# Patient Record
Sex: Female | Born: 1985 | Race: Black or African American | Hispanic: No | Smoking: Never smoker
Health system: Southern US, Community
[De-identification: ages and names within clinical notes are randomized; demographics above are authoritative.]

## PROBLEM LIST (undated history)

## (undated) DIAGNOSIS — T7840XA Allergy, unspecified, initial encounter: Secondary | ICD-10-CM

## (undated) DIAGNOSIS — Z789 Other specified health status: Secondary | ICD-10-CM

## (undated) DIAGNOSIS — F419 Anxiety disorder, unspecified: Secondary | ICD-10-CM

## (undated) DIAGNOSIS — M5430 Sciatica, unspecified side: Secondary | ICD-10-CM

## (undated) DIAGNOSIS — F32A Depression, unspecified: Secondary | ICD-10-CM

## (undated) HISTORY — DX: Depression, unspecified: F32.A

## (undated) HISTORY — PX: FOOT SURGERY: SHX648

## (undated) HISTORY — DX: Anxiety disorder, unspecified: F41.9

## (undated) HISTORY — DX: Allergy, unspecified, initial encounter: T78.40XA

---

## 2004-06-04 ENCOUNTER — Inpatient Hospital Stay (HOSPITAL_COMMUNITY): Admission: AD | Admit: 2004-06-04 | Discharge: 2004-06-04 | Payer: Self-pay | Admitting: Obstetrics and Gynecology

## 2004-06-13 ENCOUNTER — Other Ambulatory Visit: Admission: RE | Admit: 2004-06-13 | Discharge: 2004-06-13 | Payer: Self-pay | Admitting: Obstetrics and Gynecology

## 2004-12-31 ENCOUNTER — Inpatient Hospital Stay (HOSPITAL_COMMUNITY): Admission: AD | Admit: 2004-12-31 | Discharge: 2005-01-02 | Payer: Self-pay | Admitting: Obstetrics and Gynecology

## 2004-12-31 ENCOUNTER — Inpatient Hospital Stay (HOSPITAL_COMMUNITY): Admission: AD | Admit: 2004-12-31 | Discharge: 2004-12-31 | Payer: Self-pay | Admitting: *Deleted

## 2005-10-04 ENCOUNTER — Other Ambulatory Visit: Admission: RE | Admit: 2005-10-04 | Discharge: 2005-10-04 | Payer: Self-pay | Admitting: Obstetrics and Gynecology

## 2005-11-14 ENCOUNTER — Inpatient Hospital Stay (HOSPITAL_COMMUNITY): Admission: AD | Admit: 2005-11-14 | Discharge: 2005-11-16 | Payer: Self-pay | Admitting: Obstetrics and Gynecology

## 2005-12-26 ENCOUNTER — Other Ambulatory Visit: Admission: RE | Admit: 2005-12-26 | Discharge: 2005-12-26 | Payer: Self-pay | Admitting: Obstetrics and Gynecology

## 2008-06-23 ENCOUNTER — Emergency Department (HOSPITAL_COMMUNITY): Admission: EM | Admit: 2008-06-23 | Discharge: 2008-06-23 | Payer: Self-pay | Admitting: Emergency Medicine

## 2009-12-12 ENCOUNTER — Emergency Department (HOSPITAL_COMMUNITY): Admission: EM | Admit: 2009-12-12 | Discharge: 2009-12-12 | Payer: Self-pay | Admitting: Emergency Medicine

## 2010-04-18 ENCOUNTER — Emergency Department (HOSPITAL_COMMUNITY)
Admission: EM | Admit: 2010-04-18 | Discharge: 2010-04-18 | Payer: Self-pay | Source: Home / Self Care | Admitting: Emergency Medicine

## 2010-04-24 LAB — BASIC METABOLIC PANEL
BUN: 12 mg/dL (ref 6–23)
CO2: 24 mEq/L (ref 19–32)
Calcium: 9.8 mg/dL (ref 8.4–10.5)
Chloride: 104 mEq/L (ref 96–112)
Creatinine, Ser: 0.92 mg/dL (ref 0.4–1.2)
GFR calc Af Amer: >60 mL/min (ref >60)
GFR calc non Af Amer: >60 mL/min (ref >60)
Glucose, Bld: 107 mg/dL — ABNORMAL HIGH (ref 70–99)
Potassium: 3.9 mEq/L (ref 3.5–5.1)
Sodium: 138 mEq/L (ref 135–145)

## 2010-08-25 NOTE — Discharge Summary (Signed)
[name], [name]              ACCOUNT NO.:  [id number]   MEDICAL RECORD NO.:  [id number]          PATIENT TYPE:  INP   LOCATION:  9126                          FACILITY:  [facility]   PHYSICIAN:  Malachi Pro. Ambrose Mantle, M.D. DATE OF BIRTH:  Oct 12, 1985   DATE OF ADMISSION:  12/31/2004  DATE OF DISCHARGE:  01/02/2005                                 DISCHARGE SUMMARY   HISTORY OF PRESENT ILLNESS:  A 25 year old, black, single female, para 0,  gravida 1, [facility] January 03, 2005 by ultrasound admitted in active labor.  Blood group and type A positive, negative antibiotic, sickle cell negative.  RPR nonreactive.  Rubella immune. Hepatitis B surface antigen negative.  HIV  negative.  GC and Chlamydia negative.  Triple screen normal.  One-hour  Glucola 100.  Group B strep negative.  Vaginal ultrasound on June 04, 2004:  Crown-rump length 2.6 cm, 9 weeks 4 days, [facility] January 03, 2005.  Pap smear showed ASCUS, positive HPV.  CIN-1 by colposcopy.  Prenatal care  was uncomplicated.  Cervix was a tight fingertip, 60% on December 29, 2004.  The patient came to the maternity admission on the day of admission at  approximately 10:30 a.m. with contractions.  After three hours of  observation, regular contractions continued but there was no cervical  change.  After the patient went home, her contractions intensified and she  returned and the cervix was 3-4 cm when she returned.  She was kept in MAU  from 5:10 p.m. until approximately 7:15 p.m. when a bed became available.   ALLERGIES:  PENICILLIN caused a rash.   PAST MEDICAL HISTORY:  No known illnesses.   PAST SURGICAL HISTORY:  In 2001 and 2002, she had arches placed in her feet.  Alcohol, tobacco and drugs:  None.   FAMILY HISTORY:  Brother died at 2 of a glioma.  Mother has sickle cell  trait.  There is no other close first-degree relative history.   PHYSICAL EXAMINATION:  VITAL SIGNS: Normal.  HEART AND LUNGS: Normal.  ABDOMEN:  Soft.   Fundal height had been 37 cm on December 29, 2004.  Fetal  heart tones were normal except for on deceleration when the patient was  supine.  PELVIC:  When I examined the patient at 7:45 p.m., the cervix was 7 cm,  100%, vertex at -3 to -4 with a bulging bag of waters.  Artificial rupture  of membranes produced clear fluid.   The patient received an epidural and progressed to full dilatation.  She  pushed well and delivered spontaneously OA over a second-degree, right of  the midline laceration, a living female infant, 7 pounds 0 ounces, Apgars of 9  at one and 9 at five minutes.  Dr. Ambrose Mantle was in attendance.  Placenta was  intact.  Uterus normal.  Rectal negative.  Second-degree laceration repaired  with 3-0 Vicryl.  Blood loss about 400 mL.  At 12:10 a.m. on January 01, 2005, the patient's temperature was 100.7 degrees.  She never became  February after that.  She continued to do well and on the morning  of the  second postpartum day was considered stable and ready for discharge.  Initial hemoglobin 13.4, hematocrit 40, white count 16,100, platelet count  276,000.  Followup hemoglobin 11, hematocrit 33.1, white count 21,500.   FINAL DIAGNOSIS:  Intrauterine pregnancy at 39 weeks, delivered OA.   OPERATION/PROCEDURE:  1.  Spontaneous delivery OA.  2.  Repair of right second-degree laceration.   CONDITION ON DISCHARGE:  Improved.   DISCHARGE INSTRUCTIONS:  1.  Regular diet.  2.  Our regular discharge instruction booklet.  3.  The patient declines analgesics at discharge.  4.  Advised to return to the office in six weeks for followup examination.      Malachi Pro. Ambrose Mantle, M.D.  Electronically Signed     TFH/MEDQ  D:  01/02/2005  T:  01/02/2005  Job:  [id number]

## 2010-08-25 NOTE — Discharge Summary (Signed)
[name], [name] NO.:  [id number]   MEDICAL RECORD NO.:  [id number]          PATIENT TYPE:  INP   LOCATION:  9133                          [facility]ILITY:  [facility]   PHYSICIAN:  Huel Cote, M.D. DATE OF BIRTH:  05/24/1985   DATE OF ADMISSION:  11/14/2005  DATE OF DISCHARGE:  11/16/2005                                 DISCHARGE SUMMARY   DISCHARGE DIAGNOSES:  1. Term pregnancy at 38 weeks, delivered.  2. Status post normal spontaneous vaginal delivery.   DISCHARGE MEDICATIONS:  1. Motrin 600 mg p.o. every 6 hours.  2. Percocet 1-2 tablets p.o. every 4 hours p.r.n.   DISCHARGE FOLLOW-UP:  The patient is to follow up in 6 weeks for her routine  postpartum exam.  She is also requesting a tubal and when she signs her  tubal papers, this will be performed in postpartum outpatient fashion.   HISTORY OF PRESENT ILLNESS:  The patient is a 24 year old G2 P1-0-0-1, who  was admitted to 38+ plus weeks to deliver with spontaneous rupture of  membranes and contractions every 3-4 minutes.  Initially the patient was  thought to be 9 cm by RN and was quickly taken to labor and delivery, where  she was found to actually be 5 cm.  Prenatal care was complicated by late  start at 24 weeks and a positive chlamydia, which was treated with test of  cure negative.  The patient also had a low-grade Pap smear with a colposcopy  performed in pregnancy, which was acceptable and a repeat Pap smear planned  postpartum.  The patient's group B strep status at the time of admission  could not be determined and was initially reported negative; however, it was  later found that this was actually from her previous pregnancy and that her  status was indeed positive.  However, this was not discovered until after  the patient delivered.   Prenatal labs are as follows:  A positive, antibody negative, sickle  negative, RPR nonreactive, rubella immune, hepatitis B surface antigen  negative, GC  negative, chlamydia positive chest cure negative, group B strep  ultimately found to be positive.   PAST OBSTETRICAL HISTORY:  In September 2006 the patient had a vaginal  delivery of a 39-week infant at 7 pounds.   PAST GYN HISTORY:  Positive chlamydia, which was treated this pregnancy, and  a low-grade SIL Pap smear.   PAST MEDICAL HISTORY:  None.   PAST SURGICAL HISTORY:  Foot surgery x2.   ALLERGIES:  PENICILLIN.   MEDICATIONS:  None.   HOSPITAL COURSE:  On admission, the patient was afebrile with stable vital  signs.  Fetal heart rate was reactive.  She had occasional variable  decelerations and was contracting approximately every 4 minutes.  After  admission she was found to be 95, 6 and a 0 station by my exam.  She did  receive an epidural.  Shortly after her epidural she quickly progressed to  complete dilation and pushed well and had a normal spontaneous vaginal  delivery of a vigorous female infant over an intact perineum.  Apgars were  9  and 9, weight was 6 pounds 6 ounces.  Placenta delivered spontaneously.  There was a nuchal cord x1, which was reduced after delivery of the head.  Shallow abrasions only were noted that did not need repair.  Estimated blood  loss was 350 mL.  The patient was requesting a tubal ligation, however had  never addressed this issue of my office and had never signed tubal papers.  Therefore, it was explained her that this would have to be delayed until  outpatient and we would discuss it further.  She was then admitted for  routine postpartum care after delivery and did quite well.  Her hemoglobin  was 11.1 and by postpartum #2 she was ambulating, having no difficulty with  voiding and her pain was well-controlled with Motrin and Percocet.      Huel Cote, M.D.  Electronically Signed     KR/MEDQ  D:  11/16/2005  T:  11/16/2005  Job:  [id number]

## 2010-09-22 ENCOUNTER — Emergency Department (HOSPITAL_COMMUNITY)
Admission: EM | Admit: 2010-09-22 | Discharge: 2010-09-22 | Disposition: A | Discharge status: Home / Self Care | Payer: PRIVATE HEALTH INSURANCE | Attending: Emergency Medicine | Admitting: Emergency Medicine

## 2010-09-22 DIAGNOSIS — S335XXA Sprain of ligaments of lumbar spine, initial encounter: Secondary | ICD-10-CM | POA: Insufficient documentation

## 2010-09-22 DIAGNOSIS — X58XXXA Exposure to other specified factors, initial encounter: Secondary | ICD-10-CM | POA: Insufficient documentation

## 2010-09-22 LAB — URINALYSIS, ROUTINE W REFLEX MICROSCOPIC
Bilirubin Urine: NEGATIVE
Glucose, UA: NEGATIVE mg/dL
Hgb urine dipstick: NEGATIVE
Ketones, ur: NEGATIVE mg/dL
Leukocytes, UA: NEGATIVE
Nitrite: NEGATIVE
Protein, ur: NEGATIVE mg/dL
Specific Gravity, Urine: 1.025 (ref 1.005–1.030)
Urobilinogen, UA: 0.2 mg/dL (ref 0.0–1.0)
pH: 6 (ref 5.0–8.0)

## 2010-09-22 LAB — POCT PREGNANCY, URINE: Preg Test, Ur: NEGATIVE

## 2011-03-19 ENCOUNTER — Encounter: Payer: Self-pay | Admitting: *Deleted

## 2011-03-19 DIAGNOSIS — A499 Bacterial infection, unspecified: Secondary | ICD-10-CM | POA: Insufficient documentation

## 2011-03-19 DIAGNOSIS — B9689 Other specified bacterial agents as the cause of diseases classified elsewhere: Secondary | ICD-10-CM | POA: Insufficient documentation

## 2011-03-19 DIAGNOSIS — R109 Unspecified abdominal pain: Secondary | ICD-10-CM | POA: Insufficient documentation

## 2011-03-19 DIAGNOSIS — N760 Acute vaginitis: Secondary | ICD-10-CM | POA: Insufficient documentation

## 2011-03-19 DIAGNOSIS — R10819 Abdominal tenderness, unspecified site: Secondary | ICD-10-CM | POA: Insufficient documentation

## 2011-03-19 NOTE — ED Notes (Signed)
abd pain,. No nvd

## 2011-03-20 ENCOUNTER — Emergency Department (HOSPITAL_COMMUNITY)
Admission: EM | Admit: 2011-03-20 | Discharge: 2011-03-20 | Disposition: A | Discharge status: Home / Self Care | Payer: No Typology Code available for payment source | Attending: Emergency Medicine | Admitting: Emergency Medicine

## 2011-03-20 DIAGNOSIS — B9689 Other specified bacterial agents as the cause of diseases classified elsewhere: Secondary | ICD-10-CM

## 2011-03-20 LAB — URINE MICROSCOPIC-ADD ON

## 2011-03-20 LAB — URINALYSIS, ROUTINE W REFLEX MICROSCOPIC
Bilirubin Urine: NEGATIVE
Glucose, UA: NEGATIVE mg/dL
Ketones, ur: NEGATIVE mg/dL
Leukocytes, UA: NEGATIVE
Nitrite: NEGATIVE
Protein, ur: NEGATIVE mg/dL
Specific Gravity, Urine: 1.02 (ref 1.005–1.030)
Urobilinogen, UA: 0.2 mg/dL (ref 0.0–1.0)
pH: 6.5 (ref 5.0–8.0)

## 2011-03-20 LAB — WET PREP, GENITAL
Trich, Wet Prep: NONE SEEN
Yeast Wet Prep HPF POC: NONE SEEN

## 2011-03-20 LAB — PREGNANCY, URINE: Preg Test, Ur: NEGATIVE

## 2011-03-20 MED ORDER — HYDROCODONE-ACETAMINOPHEN 5-325 MG PO TABS
2.0000 | ORAL_TABLET | Freq: Once | ORAL | Status: AC
  Administered 2011-03-20: 2 via ORAL
  Filled 2011-03-20: qty 2

## 2011-03-20 MED ORDER — HYDROCODONE-ACETAMINOPHEN 5-500 MG PO TABS: 1.0000 | ORAL_TABLET | Freq: Four times a day (QID) | ORAL | Status: AC | PRN

## 2011-03-20 MED ORDER — METRONIDAZOLE 500 MG PO TABS: 500.0000 mg | ORAL_TABLET | Freq: Two times a day (BID) | ORAL | Status: AC

## 2011-03-20 NOTE — ED Notes (Signed)
Pt reports lower abdominal pain x 1day, denies any nvd, or fever. Denies any urinary symptoms.

## 2011-03-20 NOTE — ED Provider Notes (Signed)
History     CSN: [id number] Arrival date & time: 03/20/2011 12:15 AM   First MD Initiated Contact with Patient 03/20/11 0047      Chief Complaint  Patient presents with  . Abdominal Pain    (Consider location/radiation/quality/duration/timing/severity/associated sxs/prior treatment) Patient is a 25 y.o. female presenting with abdominal pain. The history is provided by the patient. No language interpreter was used.  Abdominal Pain The primary symptoms of the illness include abdominal pain. The primary symptoms of the illness do not include fatigue, shortness of breath, nausea, vomiting, diarrhea, dysuria, vaginal discharge or vaginal bleeding. The current episode started 13 to 24 hours ago. The onset of the illness was gradual. The problem has been gradually worsening.  The abdominal pain began 13 to24 hours ago. The pain came on gradually. The abdominal pain has been gradually worsening since its onset. The abdominal pain is located in the suprapubic region. The abdominal pain does not radiate. The abdominal pain is relieved by nothing. The abdominal pain is exacerbated by movement.  The patient states that she believes she is currently not pregnant. The patient has not had a change in bowel habit. Symptoms associated with the illness do not include anorexia, constipation, urgency, frequency or back pain.    History reviewed. No pertinent past medical history.  History reviewed. No pertinent past surgical history.  History reviewed. No pertinent family history.  History  Substance Use Topics  . Smoking status: Never Smoker   . Smokeless tobacco: Not on file  . Alcohol Use: No    OB History    Grav Para Term Preterm Abortions TAB SAB Ect Mult Living                  Review of Systems  Constitutional: Negative for activity change, appetite change and fatigue.  HENT: Negative for congestion, sore throat, rhinorrhea, neck pain and neck stiffness.   Respiratory: Negative for  cough and shortness of breath.   Cardiovascular: Negative for chest pain and palpitations.  Gastrointestinal: Positive for abdominal pain. Negative for nausea, vomiting, diarrhea, constipation and anorexia.  Genitourinary: Negative for dysuria, urgency, frequency, flank pain, vaginal bleeding and vaginal discharge.  Musculoskeletal: Negative for back pain and arthralgias.  Neurological: Negative for dizziness, weakness, light-headedness, numbness and headaches.  All other systems reviewed and are negative.    Allergies  Bee venom; Peanut-containing drug products; and Penicillins  Home Medications   Current Outpatient Rx  Name Route Sig Dispense Refill  . LEVONORGESTREL 20 MCG/24HR IU IUD Intrauterine 1 each by Intrauterine route once.      Marygrace Drought WOMENS PO Oral Take 1 tablet by mouth daily.      Marland Kitchen HYDROCODONE-ACETAMINOPHEN 5-500 MG PO TABS Oral Take 1-2 tablets by mouth every 6 (six) hours as needed for pain. 10 tablet 0  . METRONIDAZOLE 500 MG PO TABS Oral Take 1 tablet (500 mg total) by mouth 2 (two) times daily. 14 tablet 0    BP 126/70  Pulse 81  Temp(Src) 98.3 F (36.8 C) (Oral)  Resp 20  Ht 5\' 4"  (1.626 m)  Wt 146 lb (66.225 kg)  BMI 25.06 kg/m2  SpO2 100%  LMP 03/10/2011  Physical Exam  Nursing note and vitals reviewed. Constitutional: She is oriented to person, place, and time. She appears well-developed and well-nourished. No distress.  HENT:  Head: Normocephalic and atraumatic.  Mouth/Throat: Oropharynx is clear and moist.  Eyes: Conjunctivae and EOM are normal. Pupils are equal, round, and reactive to light.  Neck: Normal range of motion. Neck supple.  Cardiovascular: Normal rate, regular rhythm, normal heart sounds and intact distal pulses.  Exam reveals no gallop and no friction rub.   No murmur heard. Pulmonary/Chest: Effort normal and breath sounds normal. No respiratory distress.  Abdominal: Soft. Bowel sounds are normal. There is tenderness (mild  suprapubic abd pain). There is no rebound and no guarding.  Genitourinary: Vagina normal and uterus normal. Cervix exhibits no motion tenderness, no discharge and no friability. Right adnexum displays no tenderness. Left adnexum displays no tenderness. No vaginal discharge found.  Musculoskeletal: Normal range of motion. She exhibits no tenderness.  Neurological: She is alert and oriented to person, place, and time. No cranial nerve deficit.  Skin: Skin is warm and dry. No rash noted.    ED Course  Procedures (including critical care time)  Labs Reviewed  URINALYSIS, ROUTINE W REFLEX MICROSCOPIC - Abnormal; Notable for the following:    Hgb urine dipstick TRACE (*)    All other components within normal limits  WET PREP, GENITAL - Abnormal; Notable for the following:    Clue Cells, Wet Prep MODERATE (*)    WBC, Wet Prep HPF POC FEW (*)    All other components within normal limits  URINE MICROSCOPIC-ADD ON - Abnormal; Notable for the following:    Squamous Epithelial / LPF FEW (*)    All other components within normal limits  PREGNANCY, URINE  GC/CHLAMYDIA PROBE AMP, GENITAL   No results found.   1. Bacterial vaginosis   2. Abdominal pain       MDM  Wet prep with evidence of bacterial vaginosis. She'll be prescribed a course of Flagyl. There is no indication for imaging at this time as her pain is minimal on palpation. I have no concern about a surgical etiology. I have no concern for ovarian torsion she has no adnexal tenderness on pelvic examination. She has no cervical motion tenderness therefore once the treatment for PID. She is instructed to followup with her OB/GYN as her pain may be secondary to her Mirena.        Dayton Bailiff, MD 03/20/11 [phone]

## 2011-06-07 ENCOUNTER — Encounter (HOSPITAL_COMMUNITY): Payer: Self-pay | Admitting: Emergency Medicine

## 2011-06-07 ENCOUNTER — Emergency Department (HOSPITAL_COMMUNITY)
Admission: EM | Admit: 2011-06-07 | Discharge: 2011-06-07 | Disposition: A | Discharge status: Home / Self Care | Payer: No Typology Code available for payment source | Attending: Emergency Medicine | Admitting: Emergency Medicine

## 2011-06-07 DIAGNOSIS — I10 Essential (primary) hypertension: Secondary | ICD-10-CM | POA: Insufficient documentation

## 2011-06-07 DIAGNOSIS — T7840XA Allergy, unspecified, initial encounter: Secondary | ICD-10-CM | POA: Insufficient documentation

## 2011-06-07 MED ORDER — PREDNISONE 20 MG PO TABS
60.0000 mg | ORAL_TABLET | Freq: Once | ORAL | Status: AC
  Administered 2011-06-07: 60 mg via ORAL
  Filled 2011-06-07: qty 3

## 2011-06-07 MED ORDER — DIPHENHYDRAMINE HCL 25 MG PO CAPS: 25.0000 mg | ORAL_CAPSULE | Freq: Four times a day (QID) | ORAL | Status: DC | PRN

## 2011-06-07 MED ORDER — FAMOTIDINE 20 MG PO TABS: 20.0000 mg | ORAL_TABLET | Freq: Two times a day (BID) | ORAL | Status: DC

## 2011-06-07 MED ORDER — FAMOTIDINE 20 MG PO TABS
20.0000 mg | ORAL_TABLET | Freq: Once | ORAL | Status: AC
  Administered 2011-06-07: 20 mg via ORAL
  Filled 2011-06-07: qty 1

## 2011-06-07 MED ORDER — DIPHENHYDRAMINE HCL 25 MG PO CAPS
50.0000 mg | ORAL_CAPSULE | Freq: Once | ORAL | Status: AC
  Administered 2011-06-07: 50 mg via ORAL
  Filled 2011-06-07: qty 2

## 2011-06-07 NOTE — Discharge Instructions (Signed)

## 2011-06-07 NOTE — ED Notes (Signed)
Patient states she was at work and drank a frappe at 2330.  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  Patient states about half hour ago began having a rash and itching around her ears and eyes.

## 2011-06-07 NOTE — ED Provider Notes (Signed)
History     CSN: [id number]  Arrival date & time 06/07/11  2952   First MD Initiated Contact with Patient 06/07/11 [phone]      Chief Complaint  Patient presents with  . Allergic Reaction    (Consider location/radiation/quality/duration/timing/severity/associated sxs/prior treatment) The history is provided by the patient.   patient reports developing mild swelling around her bilateral eyes approximately 4 hours ago.  She has no shortness of breath.  She denies difficulty breathing or swallowing.  She reports that she does have a history of allergies tomatoes and thinks that she may have gotten some anger in whatever drinks.  She reports that her eyes were itching as well.  She has no visual changes.  She has no rash or hives.  She has not tried any medications at home for her symptoms.  She's not tried Benadryl.  Nothing worsens her symptoms.  Nothing improves her symptoms.  Her symptoms are constant and mild in severity to  History reviewed. No pertinent past medical history.  History reviewed. No pertinent past surgical history.  No family history on file.  History  Substance Use Topics  . Smoking status: Never Smoker   . Smokeless tobacco: Not on file  . Alcohol Use: No    OB History    Grav Para Term Preterm Abortions TAB SAB Ect Mult Living                  Review of Systems  All other systems reviewed and are negative.    Allergies  Bee venom; Mango flavor; Peanut-containing drug products; and Penicillins  Home Medications   Current Outpatient Rx  Name Route Sig Dispense Refill  . ONE-A-DAY WOMENS PO Oral Take 1 tablet by mouth daily.      Marland Kitchen DIPHENHYDRAMINE HCL 25 MG PO CAPS Oral Take 1 capsule (25 mg total) by mouth every 6 (six) hours as needed for itching. 15 capsule 0  . FAMOTIDINE 20 MG PO TABS Oral Take 1 tablet (20 mg total) by mouth 2 (two) times daily. 5 tablet 0  . LEVONORGESTREL 20 MCG/24HR IU IUD Intrauterine 1 each by Intrauterine route once.          BP 127/66  Pulse 76  Temp(Src) 98 F (36.7 C) (Oral)  Resp 18  Ht 5\' 4"  (1.626 m)  Wt 140 lb (63.504 kg)  BMI 24.03 kg/m2  SpO2 100%  LMP 06/06/2011  Physical Exam  Nursing note and vitals reviewed. Constitutional: She is oriented to person, place, and time. She appears well-developed and well-nourished. No distress.  HENT:  Head: Normocephalic and atraumatic.       No lip protime swelling.  Posterior pharynx is patent.  Mild swelling of the upper eyelids bilaterally.  Eyes: EOM are normal.  Neck: Normal range of motion.  Cardiovascular: Normal rate, regular rhythm and normal heart sounds.   Pulmonary/Chest: Effort normal and breath sounds normal.  Abdominal: Soft. She exhibits no distension. There is no tenderness.  Musculoskeletal: Normal range of motion.  Neurological: She is alert and oriented to person, place, and time.  Skin: Skin is warm and dry.  Psychiatric: She has a normal mood and affect. Judgment normal.    ED Course  Procedures (including critical care time)  Labs Reviewed - No data to display No results found.   1. Allergic reaction       MDM  The patient is well-appearing.  This is likely mild allergic reaction.  Benadryl and Pepcid given in the  ER.  A single dose of prednisone be given.  Discharged home on antihistamines and Pepcid.  She understands to return to the ER for new or worsening symptoms.  Vital signs are stable.        Lyanne Co, MD 06/07/11 [phone]

## 2011-12-13 ENCOUNTER — Other Ambulatory Visit: Payer: Self-pay

## 2012-01-07 ENCOUNTER — Other Ambulatory Visit: Payer: Self-pay

## 2012-01-14 ENCOUNTER — Other Ambulatory Visit (HOSPITAL_COMMUNITY): Payer: Self-pay | Admitting: Obstetrics and Gynecology

## 2012-01-14 DIAGNOSIS — IMO0002 Reserved for concepts with insufficient information to code with codable children: Secondary | ICD-10-CM

## 2012-01-18 ENCOUNTER — Encounter (HOSPITAL_COMMUNITY): Payer: Self-pay

## 2012-01-18 ENCOUNTER — Ambulatory Visit (HOSPITAL_COMMUNITY)
Admission: RE | Admit: 2012-01-18 | Discharge: 2012-01-18 | Disposition: A | Discharge status: Home / Self Care | Payer: Medicaid Other | Source: Ambulatory Visit | Attending: Obstetrics and Gynecology | Admitting: Obstetrics and Gynecology

## 2012-01-18 VITALS — BP 121/66 | HR 115 | Wt 158.0 lb

## 2012-01-18 DIAGNOSIS — IMO0002 Reserved for concepts with insufficient information to code with codable children: Secondary | ICD-10-CM

## 2012-01-18 DIAGNOSIS — Z1389 Encounter for screening for other disorder: Secondary | ICD-10-CM | POA: Insufficient documentation

## 2012-01-18 DIAGNOSIS — O358XX0 Maternal care for other (suspected) fetal abnormality and damage, not applicable or unspecified: Secondary | ICD-10-CM | POA: Insufficient documentation

## 2012-01-18 DIAGNOSIS — Z363 Encounter for antenatal screening for malformations: Secondary | ICD-10-CM | POA: Insufficient documentation

## 2012-01-18 NOTE — ED Notes (Signed)
Patient states occasional cramping, no bleeding noted.

## 2012-01-18 NOTE — Progress Notes (Signed)
GYANNA JAREMA  was seen today for an ultrasound appointment.  See full report in [facility].  Alpha Gula, MD  Ms. Belitz is seen today due to dilated right fetal kidney and echogenic focus.  Quad screen was low risk for aneuploidy.  She had cell free fetal DNA drawn at the referring clinic earlier this week - results currently pending.   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  There is minimal residual renal tissue noted.  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  The left kidney appears normal and the amniotic fluid volume is also normal.  An echogenic intracardiac focus is also noted in the left ventricle.  The findings and limitations of the study were discussed with the patient.  She will likely require Peds urology evaluation after delivery - will make arrangements for consult with them in the 3rd trimester.  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

## 2012-02-15 ENCOUNTER — Encounter (HOSPITAL_COMMUNITY): Payer: Self-pay

## 2012-02-15 ENCOUNTER — Ambulatory Visit (HOSPITAL_COMMUNITY)
Admission: RE | Admit: 2012-02-15 | Discharge: 2012-02-15 | Disposition: A | Discharge status: Home / Self Care | Payer: Medicaid Other | Source: Ambulatory Visit | Attending: Obstetrics and Gynecology | Admitting: Obstetrics and Gynecology

## 2012-02-15 VITALS — BP 107/62 | HR 86 | Wt 164.5 lb

## 2012-02-15 DIAGNOSIS — Z3689 Encounter for other specified antenatal screening: Secondary | ICD-10-CM | POA: Insufficient documentation

## 2012-02-15 DIAGNOSIS — IMO0002 Reserved for concepts with insufficient information to code with codable children: Secondary | ICD-10-CM

## 2012-02-15 DIAGNOSIS — O358XX0 Maternal care for other (suspected) fetal abnormality and damage, not applicable or unspecified: Secondary | ICD-10-CM | POA: Insufficient documentation

## 2012-02-15 NOTE — Progress Notes (Signed)
[name]  was seen today for an ultrasound appointment.  See full report in [facility].  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

## 2012-02-19 ENCOUNTER — Telehealth (HOSPITAL_COMMUNITY): Payer: Self-pay | Admitting: MS"

## 2012-02-19 NOTE — Telephone Encounter (Signed)
Called Ms. [name] regarding prenatal consultation with pediatric urologist with [facility]. Pediatric urologist is available for prenatal consults on Tuesday, 11/26 at 3:00 or January 28, given that he does not have a date planned in December. Patient planned to call her mother to see which date would work better and call our office back to schedule. We will plan to mail patient directions to [facility].    Clydie Braun Nikalas Bramel 02/19/2012 1:27 PM

## 2012-03-14 ENCOUNTER — Ambulatory Visit (HOSPITAL_COMMUNITY)
Admission: RE | Admit: 2012-03-14 | Discharge: 2012-03-14 | Disposition: A | Discharge status: Home / Self Care | Payer: Medicaid Other | Source: Ambulatory Visit | Attending: Obstetrics and Gynecology | Admitting: Obstetrics and Gynecology

## 2012-03-14 VITALS — BP 105/60 | HR 93 | Wt 172.5 lb

## 2012-03-14 DIAGNOSIS — O358XX0 Maternal care for other (suspected) fetal abnormality and damage, not applicable or unspecified: Secondary | ICD-10-CM | POA: Insufficient documentation

## 2012-03-14 DIAGNOSIS — IMO0002 Reserved for concepts with insufficient information to code with codable children: Secondary | ICD-10-CM

## 2012-03-14 NOTE — Progress Notes (Signed)
[name] was seen for ultrasound appointment today.  Please see [facility] report for details.

## 2012-03-30 LAB — OB RESULTS CONSOLE ABO/RH: RH Type: POSITIVE

## 2012-03-30 LAB — OB RESULTS CONSOLE HIV ANTIBODY (ROUTINE TESTING): HIV: NONREACTIVE

## 2012-03-30 LAB — OB RESULTS CONSOLE RUBELLA ANTIBODY, IGM: Rubella: IMMUNE

## 2012-04-11 ENCOUNTER — Ambulatory Visit (HOSPITAL_COMMUNITY)
Admission: RE | Admit: 2012-04-11 | Discharge: 2012-04-11 | Disposition: A | Discharge status: Home / Self Care | Payer: Medicaid Other | Source: Ambulatory Visit | Attending: Obstetrics and Gynecology | Admitting: Obstetrics and Gynecology

## 2012-04-11 VITALS — BP 108/57 | HR 106 | Wt 177.5 lb

## 2012-04-11 DIAGNOSIS — O358XX0 Maternal care for other (suspected) fetal abnormality and damage, not applicable or unspecified: Secondary | ICD-10-CM | POA: Insufficient documentation

## 2012-04-11 DIAGNOSIS — IMO0002 Reserved for concepts with insufficient information to code with codable children: Secondary | ICD-10-CM

## 2012-04-11 NOTE — Progress Notes (Signed)
PARTICIA STRAHM  was seen today for an ultrasound appointment.  See full report in [facility].  Comments: Uterocele with Grade IV right sided and now with Grade III hydronephrosis on the left (this finding was not noted at the time of evaluation by [facility]).    There is little residual renal tissue remaining on the right side.  The estimated fetal weight again today is > 90th %tile, but suspect that this is due to an enlarged AC (> 97th %tile) from the massive hydronephrosis.  The amniotic fluid volume is normal.  Ultrasound findings were discussed with the patient. Will contact Peds Urology to ensure that they would proceed with the follow up plan as previously outlined - with delivery at [facility], renal ultrasound of the newborn, antibiotic prophylaxis and evaluation by [facility] Urology after birth.  Will reevaluate AC at next growth scan (3 weeks) - would consider cesarean delivery to avoid abdominal dystocia / traumatic vaginal delivery based on the size of the abdomen.  Impression: Single living intrauterine pregnancy at 33 weeks 6 days. 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 Little remaining right renal tissue appreciated. Normal amniotic fluiv volume AC > 97th %tile - due to massive right sided hydronephrosis  Recommendations: Recommend weekly BPP with amniotic fluid assessments. Follow up growth scan in 3 weeks - will make recommendations for route of delivery at that time Will contact Peds urology - with normal amniotic fluid volume, doubt that this will alter delivery plan.  Alpha Gula, MD

## 2012-04-16 ENCOUNTER — Telehealth (HOSPITAL_COMMUNITY): Payer: Self-pay | Admitting: MS"

## 2012-04-16 NOTE — Telephone Encounter (Signed)
From: Baltazar Najjar  To: Marcos Eke. Jaicey Sweaney   Monday, April 14, 2012 9:20 AM  The ureterocele is likely occluding the bladder neck and causing contralateral hydro. As long as amniotic fluid levels are ok I think delivery as scheduled is fine but I would make sure the child is on amox prophylaxis and sees [place] with an [place] within a week or so birth     From: Clydie Braun L. Hayly Litsey  Sent: Friday, April 11, 2012 4:41 PM To: Baltazar Najjar Cc: Clarene Critchley. [place]; Mady Gemma Subject: Additional prenatal pt with new finding of bilateral hydronephrosis Hi Dr. Yetta Flock, We saw a patient in the [facility] office today for follow-up ultrasound, Len Klu[facility] MRN: [id number]). She had a prenatal consult with you on 11/26 due to right severe hydronephrosis and ureterocele in a female fetus. Today on follow-up ultrasound, grade III hydronephrosis of the left kidney was visualized, which is a new finding in the pregnancy. Left kidney was normal on previous ultrasounds. The amniotic fluid is normal on today's ultrasound. She is 33 weeks 6 days gestation today. (EDC: 05/24/12). She is planning to deliver at [facility] in [place]. Given the involvement now of the left kidney as well, would you change anything regarding the follow-up plan after delivery, including her location of delivery? Would you feel that it would be beneficial to see her again prenatally?  If it would be helpful to see any of the ultrasound images, we can ask for [facility] to release images to the [facility].   Thank you for very much your help regarding this patient.  Sincerely,  Clydie Braun

## 2012-04-17 ENCOUNTER — Ambulatory Visit (HOSPITAL_COMMUNITY)
Admission: RE | Admit: 2012-04-17 | Discharge: 2012-04-17 | Disposition: A | Discharge status: Home / Self Care | Payer: Medicaid Other | Source: Ambulatory Visit | Attending: Obstetrics and Gynecology | Admitting: Obstetrics and Gynecology

## 2012-04-17 VITALS — BP 107/64 | HR 102 | Wt 181.0 lb

## 2012-04-17 DIAGNOSIS — O358XX0 Maternal care for other (suspected) fetal abnormality and damage, not applicable or unspecified: Secondary | ICD-10-CM | POA: Insufficient documentation

## 2012-04-17 DIAGNOSIS — Z3689 Encounter for other specified antenatal screening: Secondary | ICD-10-CM | POA: Insufficient documentation

## 2012-04-17 DIAGNOSIS — IMO0002 Reserved for concepts with insufficient information to code with codable children: Secondary | ICD-10-CM

## 2012-04-17 NOTE — Progress Notes (Signed)
[facility] ultrasound  Indication: 27 yr old G3P2002 at [redacted]w[redacted]d with fetus with bilateral hydronephrosis and ureterocele for biophysical profile.  Findings: 1. Single intrauterine pregnancy. 2. Anterior placenta without evidence of previa. 3. Normal amniotic fluid index. 4. Again seen is bilateral hydronephrosis; right side>left side. There is also hydroureter on the right side. 5. The ureterocele is not seen on today's exam. 6. The bladder and stomach appear normal. 7. Normal biophysical profile of 8/8.  Recommendations: 1. Genitourinary anomalies: - previously counseled - has met with Pediatric urology - continue weekly BPP and evaluation of amniotic fluid - fetal growth in 2 weeks to aid in delivery recommendations - had negative MaterniT21 screen 2. Recommend follow up in 1 week  Eulis Foster, MD

## 2012-04-24 ENCOUNTER — Ambulatory Visit (HOSPITAL_COMMUNITY)
Admission: RE | Admit: 2012-04-24 | Discharge: 2012-04-24 | Disposition: A | Discharge status: Home / Self Care | Payer: Medicaid Other | Source: Ambulatory Visit | Attending: Family Medicine | Admitting: Family Medicine

## 2012-04-24 VITALS — BP 112/63 | HR 93 | Wt 181.0 lb

## 2012-04-24 DIAGNOSIS — IMO0002 Reserved for concepts with insufficient information to code with codable children: Secondary | ICD-10-CM

## 2012-04-24 DIAGNOSIS — O358XX0 Maternal care for other (suspected) fetal abnormality and damage, not applicable or unspecified: Secondary | ICD-10-CM | POA: Insufficient documentation

## 2012-04-24 DIAGNOSIS — Z3689 Encounter for other specified antenatal screening: Secondary | ICD-10-CM | POA: Insufficient documentation

## 2012-04-24 NOTE — Progress Notes (Signed)
[name]  was seen today for an ultrasound appointment.  See full report in [facility].  Impression Single living intrauterine pregnancy at 35 weeks 5 days. 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 Little remaining right renal tissue appreciated. Normal amniotic fluiv volume The fetus is active with a BPP of 8/8  Case reviewed with Dr. Yetta Flock (Peds Urology) - plan delivery at [facility] with neonatal ultrasound and Peds Urology follow up after delivery  Recommendations: Recommend follow up ultrasound for growth and BPP next week - will determine route of delivery based on AC size at that time.  Alpha Gula, MD

## 2012-04-25 ENCOUNTER — Ambulatory Visit (HOSPITAL_COMMUNITY): Payer: Medicaid Other

## 2012-05-01 ENCOUNTER — Ambulatory Visit (HOSPITAL_COMMUNITY)
Admission: RE | Admit: 2012-05-01 | Discharge: 2012-05-01 | Disposition: A | Discharge status: Home / Self Care | Payer: Medicaid Other | Source: Ambulatory Visit | Attending: Family Medicine | Admitting: Family Medicine

## 2012-05-01 ENCOUNTER — Encounter (HOSPITAL_COMMUNITY): Payer: Self-pay

## 2012-05-01 VITALS — BP 114/62 | HR 117 | Wt 183.0 lb

## 2012-05-01 DIAGNOSIS — IMO0002 Reserved for concepts with insufficient information to code with codable children: Secondary | ICD-10-CM

## 2012-05-01 DIAGNOSIS — O358XX0 Maternal care for other (suspected) fetal abnormality and damage, not applicable or unspecified: Secondary | ICD-10-CM | POA: Insufficient documentation

## 2012-05-01 NOTE — Progress Notes (Signed)
[name]  was seen today for an ultrasound appointment.  See full report in [facility].  Impression: Single living intrauterine pregnancy at 36 weeks 5 days. Grade IV right sided and grade III left sided hydronephrosis. Little remaining right renal tissue appreciated on the right. Suspected ureterocele- although not well-visualized on today's study. The [facility] today is >> 97th %tile - most likely due to the enlarged cystic right kidney The EFW is > 90th %tile, but most likely secondary to the enlarged [facility] Normal amniotic fluiv volume The fetus is active with a BPP of 8/8  Case reviewed with Dr. Yetta Flock (Peds Urology) - plan delivery at [facility] with neonatal ultrasound and Peds Urology follow up after delivery  Recommendations: Based on enlarged [facility], recommend Cesarean delivery at [redacted] weeks gestation to avoid abdominal dystocia/ undue abdominal trauma with vaginal delivery. Recommend continued weekly BPPs and amniotic fluid assessments until delivery.  Alpha Gula, MD

## 2012-05-07 ENCOUNTER — Encounter (HOSPITAL_COMMUNITY): Payer: Self-pay | Admitting: Pharmacist

## 2012-05-08 ENCOUNTER — Encounter (HOSPITAL_COMMUNITY): Payer: Self-pay

## 2012-05-08 ENCOUNTER — Ambulatory Visit (HOSPITAL_COMMUNITY)
Admission: RE | Admit: 2012-05-08 | Discharge: 2012-05-08 | Disposition: A | Discharge status: Home / Self Care | Payer: Medicaid Other | Source: Ambulatory Visit | Attending: Obstetrics and Gynecology | Admitting: Obstetrics and Gynecology

## 2012-05-08 DIAGNOSIS — O358XX0 Maternal care for other (suspected) fetal abnormality and damage, not applicable or unspecified: Secondary | ICD-10-CM | POA: Insufficient documentation

## 2012-05-08 DIAGNOSIS — IMO0002 Reserved for concepts with insufficient information to code with codable children: Secondary | ICD-10-CM

## 2012-05-08 NOTE — Progress Notes (Signed)
[name]  was seen today for an ultrasound appointment.  See full report in [facility].  Impression: Single living intrauterine pregnancy at 37 weeks 5 days. Grade IV right sided and grade III left sided hydronephrosis. Little remaining right renal tissue appreciated on the right. Suspected ureterocele Normal amniotic fluiv volume The fetus is active with a BPP of 8/8  Plan delivery at [facility] with neonatal ultrasound and Peds Urology follow up after delivery  Recommendations: Plan scheduled C-section at [redacted] weeks gestation to avoid abdominal dystocia/ undue abdominal trauma secondary to enlarged [facility] Recommend continued weekly BPPs and amniotic fluid assessments until delivery.  Alpha Gula, MD

## 2012-05-15 ENCOUNTER — Encounter (HOSPITAL_COMMUNITY)
Admission: RE | Admit: 2012-05-15 | Discharge: 2012-05-15 | Disposition: A | Discharge status: Home / Self Care | Payer: Medicaid Other | Source: Ambulatory Visit | Attending: Obstetrics and Gynecology | Admitting: Obstetrics and Gynecology

## 2012-05-15 ENCOUNTER — Encounter (HOSPITAL_COMMUNITY): Payer: Self-pay

## 2012-05-15 ENCOUNTER — Ambulatory Visit (HOSPITAL_COMMUNITY)
Admission: RE | Admit: 2012-05-15 | Discharge: 2012-05-15 | Disposition: A | Discharge status: Home / Self Care | Payer: Medicaid Other | Source: Ambulatory Visit | Attending: Obstetrics and Gynecology | Admitting: Obstetrics and Gynecology

## 2012-05-15 DIAGNOSIS — IMO0002 Reserved for concepts with insufficient information to code with codable children: Secondary | ICD-10-CM

## 2012-05-15 DIAGNOSIS — O358XX0 Maternal care for other (suspected) fetal abnormality and damage, not applicable or unspecified: Secondary | ICD-10-CM | POA: Insufficient documentation

## 2012-05-15 HISTORY — DX: Other specified health status: Z78.9

## 2012-05-15 LAB — ABO/RH: ABO/RH(D): A POS

## 2012-05-15 LAB — TYPE AND SCREEN: ABO/RH(D): A POS

## 2012-05-15 LAB — CBC
HCT: 33.7 % — ABNORMAL LOW (ref 36.0–46.0)
Hemoglobin: 11.3 g/dL — ABNORMAL LOW (ref 12.0–15.0)
MCH: 29.8 pg (ref 26.0–34.0)
MCHC: 33.5 g/dL (ref 30.0–36.0)
MCV: 88.9 fL (ref 78.0–100.0)
RDW: 14.6 % (ref 11.5–15.5)

## 2012-05-15 NOTE — Patient Instructions (Addendum)
19 [name]  05/15/2012   Your procedure is scheduled on:  05/19/12  Enter through the Main Entrance of [facility] at 730 AM.  Pick up the phone at the desk and dial [phone].   Call this number if you have problems the morning of surgery: [phone]   Remember:   Do not eat food:After Midnight.  Do not drink clear liquids: After Midnight.  Take these medicines the morning of surgery with A SIP OF WATER: NA   Do not wear jewelry, make-up or nail polish.  Do not wear lotions, powders, or perfumes. You may wear deodorant.  Do not shave 48 hours prior to surgery.  Do not bring valuables to the hospital.  Contacts, dentures or bridgework may not be worn into surgery.  Leave suitcase in the car. After surgery it may be brought to your room.  For patients admitted to the hospital, checkout time is 11:00 AM the day of discharge.   Patients discharged the day of surgery will not be allowed to drive home.  Name and phone number of your driver: NA  Special Instructions: Shower using CHG 2 nights before surgery and the night before surgery.  If you shower the day of surgery use CHG.  Use special wash - you have one bottle of CHG for all showers.  You should use approximately 1/3 of the bottle for each shower.   Please read over the following fact sheets that you were given: Surgical Site Infection Prevention

## 2012-05-18 NOTE — H&P (Signed)
[name] is a 27 y.o. female, G3 P2002, EGA 39+ weeks with EDC 2-15 presenting for elective cesarean section at recommendation of MFM due to increased fetal abdominal circumference due to fetal hydronephrosis.  Normal cell free fetal DNA testing.  Prenatal care otherwise uncomplicated.  Maternal Medical History:  Fetal activity: Perceived fetal activity is normal.      OB History   Grav Para Term Preterm Abortions TAB SAB Ect Mult Living   3 2 2  0 0 0 0 0 0 2    SVD at term x 2 without complications  Past Medical History  Diagnosis Date  . No pertinent past medical history    Past Surgical History  Procedure Laterality Date  . Foot surgery  as child    arch corrections   Family History: family history is not on file. Social History:  reports that she has never smoked. She does not have any smokeless tobacco history on file. She reports that she does not drink alcohol or use illicit drugs.   Prenatal Transfer Tool  Maternal Diabetes: No Genetic Screening: Normal Maternal Ultrasounds/Referrals: Normal Fetal Ultrasounds or other Referrals:  Referred to Materal Fetal Medicine  Maternal Substance Abuse:  No Significant Maternal Medications:  None Significant Maternal Lab Results:  Lab values include: Group B Strep positive Other Comments:  Grade IV right fetal hydronephrosis, some dilation of left kidney as well.  Has had pediatric urology consult.  Review of Systems  Respiratory: Negative.   Cardiovascular: Negative.       Last menstrual period 08/18/2011. Maternal Exam:  Abdomen: Estimated fetal weight is 7 lbs.   Fetal presentation: vertex  Introitus: Normal vulva. Normal vagina.    Physical Exam  Constitutional: She appears well-developed and well-nourished.  Neck: Neck supple. No thyromegaly present.  Cardiovascular: Normal rate and regular rhythm.   Murmur (III/VI systolic murmur) heard. Respiratory: Effort normal and breath sounds normal. No respiratory  distress. She has no wheezes.  GI: Soft.  gravid   VE- 1/40/-3, vtx  Prenatal labs: ABO, Rh: --/--/A POS, A POS (02/06 1350) Antibody: NEG (02/06 1350) Rubella: Immune (12/22 0938) RPR: NON REACTIVE (02/06 1345)  HBsAg: Negative (12/22 0938)  HIV: Non-reactive (12/22 4098)  GBS:   positive GCT:  115  Assessment/Plan: IUP at 39+ weeks for scheduled c-section at recommendation of MFM to avoid abdominal trauma/dystocia with significantly dilated right fetal kidney.  The procedure and risks have been discussed with the patient.     Sanchez Hemmer D 05/18/2012, 10:55 AM

## 2012-05-19 ENCOUNTER — Encounter (HOSPITAL_COMMUNITY): Payer: Self-pay | Admitting: *Deleted

## 2012-05-19 ENCOUNTER — Encounter (HOSPITAL_COMMUNITY)
Admission: AD | Disposition: A | Discharge status: Home / Self Care | Payer: Self-pay | Source: Ambulatory Visit | Attending: Obstetrics and Gynecology

## 2012-05-19 ENCOUNTER — Inpatient Hospital Stay (HOSPITAL_COMMUNITY)
Admission: AD | Admit: 2012-05-19 | Discharge: 2012-05-23 | DRG: 766 | Disposition: A | Discharge status: Home / Self Care | Payer: Medicaid Other | Source: Ambulatory Visit | Attending: Obstetrics and Gynecology | Admitting: Obstetrics and Gynecology

## 2012-05-19 ENCOUNTER — Encounter (HOSPITAL_COMMUNITY): Payer: Self-pay | Admitting: Registered Nurse

## 2012-05-19 ENCOUNTER — Inpatient Hospital Stay (HOSPITAL_COMMUNITY): Payer: Medicaid Other | Admitting: Registered Nurse

## 2012-05-19 DIAGNOSIS — O99892 Other specified diseases and conditions complicating childbirth: Secondary | ICD-10-CM | POA: Diagnosis present

## 2012-05-19 DIAGNOSIS — O358XX0 Maternal care for other (suspected) fetal abnormality and damage, not applicable or unspecified: Principal | ICD-10-CM | POA: Diagnosis present

## 2012-05-19 DIAGNOSIS — Z22330 Carrier of Group B streptococcus: Secondary | ICD-10-CM

## 2012-05-19 DIAGNOSIS — Z98891 History of uterine scar from previous surgery: Secondary | ICD-10-CM

## 2012-05-19 LAB — RPR: RPR Ser Ql: NONREACTIVE

## 2012-05-19 LAB — TYPE AND SCREEN
ABO/RH(D): A POS
Antibody Screen: NEGATIVE

## 2012-05-19 SURGERY — Surgical Case
Anesthesia: Spinal | Wound class: Clean Contaminated

## 2012-05-19 MED ORDER — OXYTOCIN 10 UNIT/ML IJ SOLN
40.0000 [IU] | INTRAVENOUS | Status: DC | PRN
  Administered 2012-05-19: 40 [IU] via INTRAVENOUS

## 2012-05-19 MED ORDER — LACTATED RINGERS IV SOLN: INTRAVENOUS | Status: DC

## 2012-05-19 MED ORDER — SCOPOLAMINE 1 MG/3DAYS TD PT72
1.0000 | MEDICATED_PATCH | Freq: Once | TRANSDERMAL | Status: DC
  Filled 2012-05-19: qty 1

## 2012-05-19 MED ORDER — DIPHENHYDRAMINE HCL 25 MG PO CAPS: 25.0000 mg | ORAL_CAPSULE | ORAL | Status: DC | PRN

## 2012-05-19 MED ORDER — LANOLIN HYDROUS EX OINT: 1.0000 "application " | TOPICAL_OINTMENT | CUTANEOUS | Status: DC | PRN

## 2012-05-19 MED ORDER — SODIUM CHLORIDE 0.9 % IJ SOLN: 3.0000 mL | INTRAMUSCULAR | Status: DC | PRN

## 2012-05-19 MED ORDER — MEPERIDINE HCL 25 MG/ML IJ SOLN: 6.2500 mg | INTRAMUSCULAR | Status: DC | PRN

## 2012-05-19 MED ORDER — DIPHENHYDRAMINE HCL 50 MG/ML IJ SOLN
12.5000 mg | INTRAMUSCULAR | Status: DC | PRN
  Administered 2012-05-19: 12.5 mg via INTRAVENOUS
  Filled 2012-05-19: qty 1

## 2012-05-19 MED ORDER — SCOPOLAMINE 1 MG/3DAYS TD PT72: 1.0000 | MEDICATED_PATCH | Freq: Once | TRANSDERMAL | Status: DC

## 2012-05-19 MED ORDER — FENTANYL CITRATE 0.05 MG/ML IJ SOLN
INTRAMUSCULAR | Status: AC
  Filled 2012-05-19: qty 2

## 2012-05-19 MED ORDER — OXYCODONE-ACETAMINOPHEN 5-325 MG PO TABS
1.0000 | ORAL_TABLET | ORAL | Status: DC | PRN
  Administered 2012-05-20 – 2012-05-21 (×4): 1 via ORAL
  Administered 2012-05-21 – 2012-05-22 (×2): 2 via ORAL
  Administered 2012-05-23: 1 via ORAL
  Filled 2012-05-19: qty 1
  Filled 2012-05-19: qty 2
  Filled 2012-05-19 (×3): qty 1
  Filled 2012-05-19: qty 2
  Filled 2012-05-19: qty 1

## 2012-05-19 MED ORDER — PHENYLEPHRINE 40 MCG/ML (10ML) SYRINGE FOR IV PUSH (FOR BLOOD PRESSURE SUPPORT)
PREFILLED_SYRINGE | INTRAVENOUS | Status: AC
  Filled 2012-05-19: qty 5

## 2012-05-19 MED ORDER — IBUPROFEN 600 MG PO TABS
600.0000 mg | ORAL_TABLET | Freq: Four times a day (QID) | ORAL | Status: DC
  Administered 2012-05-19 – 2012-05-23 (×16): 600 mg via ORAL
  Filled 2012-05-19 (×15): qty 1

## 2012-05-19 MED ORDER — NALOXONE HCL 0.4 MG/ML IJ SOLN: 0.4000 mg | INTRAMUSCULAR | Status: DC | PRN

## 2012-05-19 MED ORDER — EPHEDRINE SULFATE 50 MG/ML IJ SOLN
INTRAMUSCULAR | Status: DC | PRN
  Administered 2012-05-19: 10 mg via INTRAVENOUS

## 2012-05-19 MED ORDER — TETANUS-DIPHTH-ACELL PERTUSSIS 5-2.5-18.5 LF-MCG/0.5 IM SUSP: 0.5000 mL | Freq: Once | INTRAMUSCULAR | Status: DC

## 2012-05-19 MED ORDER — WITCH HAZEL-GLYCERIN EX PADS: 1.0000 "application " | MEDICATED_PAD | CUTANEOUS | Status: DC | PRN

## 2012-05-19 MED ORDER — PHENYLEPHRINE HCL 10 MG/ML IJ SOLN
INTRAMUSCULAR | Status: DC | PRN
  Administered 2012-05-19: 200 ug via INTRAVENOUS

## 2012-05-19 MED ORDER — DIBUCAINE 1 % RE OINT: 1.0000 "application " | TOPICAL_OINTMENT | RECTAL | Status: DC | PRN

## 2012-05-19 MED ORDER — DIPHENHYDRAMINE HCL 50 MG/ML IJ SOLN: 25.0000 mg | INTRAMUSCULAR | Status: DC | PRN

## 2012-05-19 MED ORDER — METOCLOPRAMIDE HCL 5 MG/ML IJ SOLN: 10.0000 mg | Freq: Three times a day (TID) | INTRAMUSCULAR | Status: DC | PRN

## 2012-05-19 MED ORDER — MENTHOL 3 MG MT LOZG: 1.0000 | LOZENGE | OROMUCOSAL | Status: DC | PRN

## 2012-05-19 MED ORDER — NALOXONE HCL 1 MG/ML IJ SOLN
1.0000 ug/kg/h | INTRAVENOUS | Status: DC | PRN
  Filled 2012-05-19: qty 2

## 2012-05-19 MED ORDER — ONDANSETRON HCL 4 MG/2ML IJ SOLN: 4.0000 mg | INTRAMUSCULAR | Status: DC | PRN

## 2012-05-19 MED ORDER — KETOROLAC TROMETHAMINE 60 MG/2ML IM SOLN
INTRAMUSCULAR | Status: AC
  Administered 2012-05-19: 60 mg via INTRAMUSCULAR
  Filled 2012-05-19: qty 2

## 2012-05-19 MED ORDER — KETOROLAC TROMETHAMINE 30 MG/ML IJ SOLN: 30.0000 mg | Freq: Four times a day (QID) | INTRAMUSCULAR | Status: AC | PRN

## 2012-05-19 MED ORDER — DIPHENHYDRAMINE HCL 25 MG PO CAPS: 25.0000 mg | ORAL_CAPSULE | Freq: Four times a day (QID) | ORAL | Status: DC | PRN

## 2012-05-19 MED ORDER — SCOPOLAMINE 1 MG/3DAYS TD PT72
MEDICATED_PATCH | TRANSDERMAL | Status: AC
  Administered 2012-05-19: 1.5 mg
  Filled 2012-05-19: qty 1

## 2012-05-19 MED ORDER — ZOLPIDEM TARTRATE 5 MG PO TABS: 5.0000 mg | ORAL_TABLET | Freq: Every evening | ORAL | Status: DC | PRN

## 2012-05-19 MED ORDER — SIMETHICONE 80 MG PO CHEW: 80.0000 mg | CHEWABLE_TABLET | ORAL | Status: DC | PRN

## 2012-05-19 MED ORDER — ONDANSETRON HCL 4 MG/2ML IJ SOLN: 4.0000 mg | Freq: Three times a day (TID) | INTRAMUSCULAR | Status: DC | PRN

## 2012-05-19 MED ORDER — IBUPROFEN 600 MG PO TABS: 600.0000 mg | ORAL_TABLET | Freq: Four times a day (QID) | ORAL | Status: DC | PRN

## 2012-05-19 MED ORDER — EPHEDRINE 5 MG/ML INJ
INTRAVENOUS | Status: AC
  Filled 2012-05-19: qty 10

## 2012-05-19 MED ORDER — PRENATAL MULTIVITAMIN CH
1.0000 | ORAL_TABLET | Freq: Every day | ORAL | Status: DC
  Administered 2012-05-19 – 2012-05-23 (×5): 1 via ORAL
  Filled 2012-05-19 (×4): qty 1

## 2012-05-19 MED ORDER — NALBUPHINE HCL 10 MG/ML IJ SOLN
5.0000 mg | INTRAMUSCULAR | Status: DC | PRN
  Filled 2012-05-19: qty 1

## 2012-05-19 MED ORDER — MORPHINE SULFATE 0.5 MG/ML IJ SOLN
INTRAMUSCULAR | Status: AC
  Filled 2012-05-19: qty 10

## 2012-05-19 MED ORDER — ONDANSETRON HCL 4 MG PO TABS: 4.0000 mg | ORAL_TABLET | ORAL | Status: DC | PRN

## 2012-05-19 MED ORDER — OXYTOCIN 40 UNITS IN LACTATED RINGERS INFUSION - SIMPLE MED
62.5000 mL/h | INTRAVENOUS | Status: AC
  Administered 2012-05-19: 62.5 mL/h via INTRAVENOUS
  Filled 2012-05-19: qty 1000

## 2012-05-19 MED ORDER — LACTATED RINGERS IV SOLN
INTRAVENOUS | Status: DC
  Administered 2012-05-19 (×4): via INTRAVENOUS

## 2012-05-19 MED ORDER — FENTANYL CITRATE 0.05 MG/ML IJ SOLN: 25.0000 ug | INTRAMUSCULAR | Status: DC | PRN

## 2012-05-19 MED ORDER — CEFAZOLIN SODIUM-DEXTROSE 2-3 GM-% IV SOLR
2.0000 g | INTRAVENOUS | Status: AC
  Administered 2012-05-19: 2 g via INTRAVENOUS

## 2012-05-19 MED ORDER — SENNOSIDES-DOCUSATE SODIUM 8.6-50 MG PO TABS
2.0000 | ORAL_TABLET | Freq: Every day | ORAL | Status: DC
  Administered 2012-05-19 – 2012-05-22 (×4): 2 via ORAL

## 2012-05-19 MED ORDER — OXYTOCIN 10 UNIT/ML IJ SOLN
INTRAMUSCULAR | Status: AC
  Filled 2012-05-19: qty 4

## 2012-05-19 MED ORDER — MEASLES, MUMPS & RUBELLA VAC ~~LOC~~ INJ
0.5000 mL | INJECTION | Freq: Once | SUBCUTANEOUS | Status: DC
  Filled 2012-05-19: qty 0.5

## 2012-05-19 MED ORDER — ONDANSETRON HCL 4 MG/2ML IJ SOLN
INTRAMUSCULAR | Status: DC | PRN
  Administered 2012-05-19: 4 mg via INTRAVENOUS

## 2012-05-19 MED ORDER — MAGNESIUM HYDROXIDE 400 MG/5ML PO SUSP: 30.0000 mL | ORAL | Status: DC | PRN

## 2012-05-19 MED ORDER — MORPHINE SULFATE (PF) 0.5 MG/ML IJ SOLN
INTRAMUSCULAR | Status: DC | PRN
  Administered 2012-05-19: .15 mg via INTRATHECAL

## 2012-05-19 MED ORDER — FENTANYL CITRATE 0.05 MG/ML IJ SOLN
INTRAMUSCULAR | Status: DC | PRN
  Administered 2012-05-19: 25 ug via INTRATHECAL

## 2012-05-19 MED ORDER — CEFAZOLIN SODIUM-DEXTROSE 2-3 GM-% IV SOLR
INTRAVENOUS | Status: AC
  Filled 2012-05-19: qty 50

## 2012-05-19 MED ORDER — SIMETHICONE 80 MG PO CHEW
80.0000 mg | CHEWABLE_TABLET | Freq: Three times a day (TID) | ORAL | Status: DC
  Administered 2012-05-19 – 2012-05-22 (×13): 80 mg via ORAL

## 2012-05-19 MED ORDER — ONDANSETRON HCL 4 MG/2ML IJ SOLN
INTRAMUSCULAR | Status: AC
  Filled 2012-05-19: qty 2

## 2012-05-19 MED ORDER — KETOROLAC TROMETHAMINE 60 MG/2ML IM SOLN
60.0000 mg | Freq: Once | INTRAMUSCULAR | Status: AC | PRN
  Administered 2012-05-19: 60 mg via INTRAMUSCULAR

## 2012-05-19 SURGICAL SUPPLY — 32 items
CLOTH BEACON ORANGE TIMEOUT ST (SAFETY) ×2 IMPLANT
CONTAINER PREFILL 10% NBF 15ML (MISCELLANEOUS) IMPLANT
DRAPE LG THREE QUARTER DISP (DRAPES) ×2 IMPLANT
DRSG OPSITE POSTOP 4X10 (GAUZE/BANDAGES/DRESSINGS) ×2 IMPLANT
DRSG OPSITE POSTOP 4X12 (GAUZE/BANDAGES/DRESSINGS) ×2 IMPLANT
DURAPREP 26ML APPLICATOR (WOUND CARE) ×2 IMPLANT
ELECT REM PT RETURN 9FT ADLT (ELECTROSURGICAL) ×2
ELECTRODE REM PT RTRN 9FT ADLT (ELECTROSURGICAL) ×1 IMPLANT
EXTRACTOR VACUUM KIWI (MISCELLANEOUS) IMPLANT
EXTRACTOR VACUUM M CUP 4 TUBE (SUCTIONS) IMPLANT
GLOVE BIO SURGEON STRL SZ8 (GLOVE) ×2 IMPLANT
GLOVE ORTHO TXT STRL SZ7.5 (GLOVE) ×2 IMPLANT
GLOVE SURG SS PI 7.5 STRL IVOR (GLOVE) ×2 IMPLANT
GOWN PREVENTION PLUS LG XLONG (DISPOSABLE) ×4 IMPLANT
KIT ABG SYR 3ML LUER SLIP (SYRINGE) IMPLANT
NEEDLE HYPO 25X5/8 SAFETYGLIDE (NEEDLE) ×2 IMPLANT
NS IRRIG 1000ML POUR BTL (IV SOLUTION) ×2 IMPLANT
PACK C SECTION WH (CUSTOM PROCEDURE TRAY) ×2 IMPLANT
PAD OB MATERNITY 4.3X12.25 (PERSONAL CARE ITEMS) ×2 IMPLANT
RTRCTR C-SECT PINK 25CM LRG (MISCELLANEOUS) ×2 IMPLANT
SLEEVE SCD COMPRESS KNEE MED (MISCELLANEOUS) IMPLANT
STAPLER VISISTAT 35W (STAPLE) IMPLANT
STRIP CLOSURE SKIN 1/2X4 (GAUZE/BANDAGES/DRESSINGS) ×2 IMPLANT
SUT CHROMIC 1 CTX 36 (SUTURE) ×4 IMPLANT
SUT PLAIN 0 NONE (SUTURE) IMPLANT
SUT PLAIN 2 0 XLH (SUTURE) IMPLANT
SUT VIC AB 0 CT1 27 (SUTURE) ×2
SUT VIC AB 0 CT1 27XBRD ANBCTR (SUTURE) ×2 IMPLANT
SUT VIC AB 4-0 KS 27 (SUTURE) ×2 IMPLANT
TOWEL OR 17X24 6PK STRL BLUE (TOWEL DISPOSABLE) ×6 IMPLANT
TRAY FOLEY CATH 14FR (SET/KITS/TRAYS/PACK) ×2 IMPLANT
WATER STERILE IRR 1000ML POUR (IV SOLUTION) ×2 IMPLANT

## 2012-05-19 NOTE — Anesthesia Preprocedure Evaluation (Signed)

## 2012-05-19 NOTE — Anesthesia Postprocedure Evaluation (Signed)
  Anesthesia Post-op Note  Patient: [name]  Procedure(s) Performed: Procedure(s) with comments: Primary Cesarean Section (N/A) - Primary Cesarean Section   Patient is awake, responsive, moving her legs, and has signs of resolution of her numbness. Pain and nausea are reasonably well controlled. Vital signs are stable and clinically acceptable. Oxygen saturation is clinically acceptable. There are no apparent anesthetic complications at this time. Patient is ready for discharge.

## 2012-05-19 NOTE — Transfer of Care (Signed)
Immediate Anesthesia Transfer of Care Note  Patient: [name]  Procedure(s) Performed: Procedure(s) with comments: Primary Cesarean Section (N/A) - Primary Cesarean Section  Patient Location: PACU  Anesthesia Type:Spinal  Level of Consciousness: awake, alert , oriented and patient cooperative  Airway & Oxygen Therapy: Patient Spontanous Breathing  Post-op Assessment: Report given to PACU RN and Post -op Vital signs reviewed and stable  Post vital signs: Reviewed and stable  Complications: No apparent anesthesia complications

## 2012-05-19 NOTE — Op Note (Signed)
Preoperative diagnosis: Intrauterine pregnancy at 39 weeks, fetal hydronephrosis Postoperative diagnosis: Same Procedure: Primary low transverse cesarean section without extensions Surgeon: Lavina Hamman M.D. Assistant:  Tracey Harries, MD Anesthesia: Spinal  Findings: Patient had normal gravid anatomy and delivered a viable female infant with Apgars of 9 and 9 weight 8 lbs 2oz, fetal abdomen is distended Estimated blood loss: 800 cc Specimens: Placenta sent to labor and delivery Complications: None  Procedure in detail: The patient was taken to the operating room and placed in the sitting position. Dr. Cristela Blue instilled spinal anesthesia.  She was then placed in the dorsosupine position with left tilt. Abdomen was then prepped and draped in the usual sterile fashion, and a foley catheter was inserted. The level of her anesthesia was found to be adequate. Abdomen was entered via a standard Pfannenstiel incision. Once the peritoneal cavity was entered the Alexis disposable self-retaining retractor was placed and good visualization was achieved. A 4 cm transverse incision was then made in the lower uterine segment pushing the bladder inferior. Once the uterine cavity was entered the incision was extended digitally. The fetal vertex was grasped and delivered through the incision atraumatically. Mouth and nares were suctioned. The remainder of the infant then delivered atraumatically. Cord was doubly clamped and cut and the infant handed to the awaiting pediatric team. Cord blood was obtained. The placenta delivered spontaneously. Uterus was wiped dry with clean lap pad and all clots and debris were removed. Uterine incision was inspected and found to be free of extensions. Uterine incision was closed in 2 layers with running locking #1 Chromic for the first layer, an imbricating layer also with #1 Chromic for the second layer. Tubes and ovaries were inspected and found to be normal. Uterine incision was  inspected and found to be hemostatic. Bleeding from serosal edges was controlled with electrocautery. The Alexis retractor was removed. Subfascial space was irrigated and made hemostatic with electrocautery. Fascia was closed in running fashion starting at both ends and meeting in the middle with 0 Vicryl. Subcutaneous tissue was then irrigated and made hemostatic with electrocautery. Skin was closed with running subcuticular 4-0 Vicryl followed by a steri-strips and a sterile dressing. Patient tolerated the procedure well and was taken to the recovery in stable condition. Counts were correct x2, she received Ancef 2 g IV at the beginning of the procedure and she had PAS hose on throughout the procedure.

## 2012-05-19 NOTE — Interval H&P Note (Signed)
History and Physical Interval Note:  05/19/2012 8:50 AM  [name]  has presented today for surgery, with the diagnosis of fetal hydronephrosis 59510  The various methods of treatment have been discussed with the patient and family. After consideration of risks, benefits and other options for treatment, the patient has consented to  Procedure(s) with comments: CESAREAN SECTION (N/A) - primary c/s as a surgical intervention .  The patient's history has been reviewed, patient examined, no change in status, stable for surgery.  I have reviewed the patient's chart and labs.  Questions were answered to the patient's satisfaction.     Iriel Nason D

## 2012-05-19 NOTE — Anesthesia Postprocedure Evaluation (Signed)
  Anesthesia Post-op Note  Patient: [name]  Procedure(s) Performed: Procedure(s) with comments: Primary Cesarean Section (N/A) - Primary Cesarean Section  Patient Location: Women's Unit  Anesthesia Type:Spinal  Level of Consciousness: awake, alert  and oriented  Airway and Oxygen Therapy: Patient Spontanous Breathing  Post-op Pain: none  Post-op Assessment: Post-op Vital signs reviewed  Post-op Vital Signs: Reviewed and stable  Complications: No apparent anesthesia complications

## 2012-05-19 NOTE — Anesthesia Procedure Notes (Signed)
Spinal  Additional Notes Spinal Dosage in OR  Bupivicaine ml       1.4 PFMS04   mcg        150 Fentanyl mcg            25

## 2012-05-20 ENCOUNTER — Encounter (HOSPITAL_COMMUNITY): Payer: Self-pay | Admitting: Obstetrics and Gynecology

## 2012-05-20 LAB — CBC
HCT: 33.7 % — ABNORMAL LOW (ref 36.0–46.0)
Hemoglobin: 11.1 g/dL — ABNORMAL LOW (ref 12.0–15.0)
WBC: 9.5 10*3/uL (ref 4.0–10.5)

## 2012-05-20 NOTE — Progress Notes (Signed)
Subjective: Postpartum Day #1: Cesarean Delivery Patient reports incisional pain and tolerating PO.  Baby stable in NICU  Objective: Vital signs in last 24 hours: Temp:  [97.8 F (36.6 C)-98.8 F (37.1 C)] 97.8 F (36.6 C) (02/11 0557) Pulse Rate:  [74-103] 74 (02/11 0557) Resp:  [13-19] 18 (02/11 0557) BP: (109-128)/(47-69) 118/69 mmHg (02/11 0557) SpO2:  [97 %-100 %] 98 % (02/11 0557) Weight:  [85.276 kg (188 lb)] 85.276 kg (188 lb) (02/10 1242)  Physical Exam:  General: alert Lochia: appropriate Uterine Fundus: firm Incision: healing well    Recent Labs  05/20/12 0540  HGB 11.1*  HCT 33.7*    Assessment/Plan: Status post Cesarean section. Doing well postoperatively.  Continue current care, ambulate.  Mouhamadou Gittleman D 05/20/2012, 8:50 AM

## 2012-05-20 NOTE — Lactation Note (Signed)
This note was copied from the chart of Boy Tiffanyann Deroo. Lactation Consultation Note  Patient Name: Boy Velda Wendt Today's Date: 05/20/2012     Maternal Data    Feeding Feeding Type: Formula Feeding method: Bottle Nipple Type: Regular Length of feed: 25 min  LATCH Score/Interventions                      Lactation Tools Discussed/Used     Consult Status  Initial consult with this mom of a term baby, born by C-section due to his having a distended abdomen, due to bilateral hydronephrosis. Mom has breast fed her first two children. I reviewed DEP teaching with her, and reviewed the NICU booklet on how to provide breast milk for a NICU baby. Mom is doing well with pumping, expressing about 5 mls with each pumping. I reviewed hand expression with her, and lactation services.Mom denies and discomfort at the time, and knows to call for questions/concerns. I advised her to call [facility] and let them know she will need a DEP. I will follow this family in the niCU    Alfred Levins 05/20/2012, 7:19 PM

## 2012-05-21 NOTE — Progress Notes (Addendum)
Clinical Social Work Department PSYCHOSOCIAL ASSESSMENT - MATERNAL/CHILD 05/21/2012  Patient:  [name], [name]  Account Number:  [id number]  Admit Date:  05/19/2012  [name] Name:   [name]    Clinical Social Worker:  Lulu Riding, LCSW   Date/Time:  05/21/2012 10:00 AM  Date Referred:  05/21/2012   Referral source  NICU     Referred reason  NICU   Other referral source:    I:  FAMILY / HOME ENVIRONMENT Child's legal guardian:  PARENT  Guardian - Name Guardian - Age Guardian - Address  [name] [address] [place], [place] [address]  [name]  Currently working in [place]   Other household support members/support persons Name Relationship DOB  [name] Mother   [name] SON 7  [name] DAUGHTER 6   Other support:   MOB reports having a good support system    II  PSYCHOSOCIAL DATA Information Source:  Patient Interview  Surveyor, quantity and Community Resources Employment:   MOB-[employer]  FOB-[employer] administrator resources:  Medicaid If Medicaid - County:  [place] [employer]  School / Grade:   Maternity Care Coordinator / Child Services Coordination / Early Interventions:  Cultural issues impacting care:   None indicated    III  STRENGTHS Strengths  Adequate Resources  Compliance with medical plan  Home prepared for Child (including basic supplies)  Other - See comment  Supportive family/friends  Understanding of illness   Strength comment:  Pediatric follow up will be at [facility]   IV  RISK FACTORS AND CURRENT PROBLEMS Current Problem:  None   Risk Factor & Current Problem Patient Issue Family Issue Risk Factor / Current Problem Comment   N N     V  SOCIAL WORK ASSESSMENT  CSW met with MOB in her third floor room/309 to introduce myself, complete assessment and evaluate how she is coping with baby's admissions to NICU.  MOB welcomed CSW into the room.  She was quiet, but friendly and seems to be coping  well with the situation.  She states she is taking things one day at a time and that the staff has been keeping her up to date with information on her baby.  She states a good support system, no issues with transportation after her discharge and having all needed supplies for baby at home.  She states she and her children live with her mother.  She reports that FOB works in [place] due to a "family situation," and that he is trying to find a ride home to be with her and see the baby.  She did not elaborate on the situation, but states it has to do with his family.  She reports he is the father of her 22 year old.  CSW explained potential eligibility for SSI due to baby's hydronephrosis and MOB states she knows what SSI is because her son has Autism and receives SSI.  She is not interested in applying at this time, but will let CSW know if she changes her mind.  MOB states no concerns with emotional state at this point.  CSW gave contact information and explained support services offered by NICU CSW.  MOB seemed appreciative of CSW's visit.   VI SOCIAL WORK PLAN Social Work Plan  Psychosocial Support/Ongoing Assessment of Needs   Type of pt/family education:   Possible SSI eligibility   If child protective services report - county:   If child protective services report - date:  Information/referral to community resources comment:   SSI application assistance if MOB desires.   Other social work plan:

## 2012-05-21 NOTE — Progress Notes (Signed)
POD #2 Doing ok, baby stable in NICU Afeb, VSS Abd- soft, fundus firm, incision intact Continue routine care

## 2012-05-22 NOTE — Progress Notes (Signed)
POD #3 Doing ok, baby stable Afeb, VSS Abd- soft, fundus firm, incision intact Continue routine care, d/c tomorrow

## 2012-05-23 MED ORDER — OXYCODONE-ACETAMINOPHEN 5-325 MG PO TABS: 1.0000 | ORAL_TABLET | ORAL | Status: DC | PRN

## 2012-05-23 MED ORDER — IBUPROFEN 600 MG PO TABS: 600.0000 mg | ORAL_TABLET | Freq: Four times a day (QID) | ORAL | Status: DC

## 2012-05-23 NOTE — Progress Notes (Signed)
[name] reported that she is doing well.  She is grateful that her son is with her again.  She did not have any particular concerns at this time but was grateful for the visit.  [address] [name] Pager, [phone] 2:32 PM   05/23/12 1400  Clinical Encounter Type  Visited With Patient  Visit Type Initial

## 2012-05-23 NOTE — Discharge Summary (Signed)
Obstetric Discharge Summary Reason for Admission: cesarean section Prenatal Procedures: ultrasound Intrapartum Procedures: cesarean: low cervical, transverse Postpartum Procedures: none Complications-Operative and Postpartum: none Hemoglobin  Date Value Range Status  05/20/2012 11.1* 12.0 - 15.0 g/dL Final     HCT  Date Value Range Status  05/20/2012 33.7* 36.0 - 46.0 % Final    Physical Exam:  General: alert Lochia: appropriate Uterine Fundus: firm Incision: healing well   Discharge Diagnoses: Term Pregnancy-delivered and fetal hydronephrosis  Discharge Information: Date: 05/23/2012 Activity: pelvic rest and no strenuous activity Diet: routine Medications: Ibuprofen and Percocet Condition: stable Instructions: refer to practice specific booklet Discharge to: home Follow-up Information   Follow up with Offie Pickron D, MD. Schedule an appointment as soon as possible for a visit in 2 weeks. (Call Monday to schedule circumcision)    Contact information:   [address] [place] [address] [phone]       Newborn Data: Live born female  Birth Weight: 8 lb 2.5 oz (3700 g) APGAR: 9, 9  Home with mother.  Jennfer Gassen D 05/23/2012, 8:03 AM

## 2012-05-23 NOTE — Progress Notes (Signed)
POD #4 No problems, baby in room with her Afeb, VSS Abd- soft, fundus firm, incision ok D/c home

## 2012-05-23 NOTE — Progress Notes (Signed)
Pt is discharged in the care of Mother Stable. Abdominal honey  Dressing is dry and intact. Bonding well with infant. Discharge instuctions for pt and baby were given with good understanding. Questions were asked and answered.. Downstairs per ambulatory.

## 2012-10-18 ENCOUNTER — Encounter (HOSPITAL_COMMUNITY): Payer: Self-pay

## 2012-10-18 ENCOUNTER — Emergency Department (HOSPITAL_COMMUNITY)
Admission: EM | Admit: 2012-10-18 | Discharge: 2012-10-18 | Disposition: A | Discharge status: Home / Self Care | Payer: Self-pay | Attending: Emergency Medicine | Admitting: Emergency Medicine

## 2012-10-18 DIAGNOSIS — Z880 Allergy status to penicillin: Secondary | ICD-10-CM | POA: Insufficient documentation

## 2012-10-18 DIAGNOSIS — R112 Nausea with vomiting, unspecified: Secondary | ICD-10-CM | POA: Insufficient documentation

## 2012-10-18 DIAGNOSIS — N390 Urinary tract infection, site not specified: Secondary | ICD-10-CM | POA: Insufficient documentation

## 2012-10-18 DIAGNOSIS — Z79899 Other long term (current) drug therapy: Secondary | ICD-10-CM | POA: Insufficient documentation

## 2012-10-18 DIAGNOSIS — Z3202 Encounter for pregnancy test, result negative: Secondary | ICD-10-CM | POA: Insufficient documentation

## 2012-10-18 DIAGNOSIS — R197 Diarrhea, unspecified: Secondary | ICD-10-CM | POA: Insufficient documentation

## 2012-10-18 DIAGNOSIS — R109 Unspecified abdominal pain: Secondary | ICD-10-CM | POA: Insufficient documentation

## 2012-10-18 LAB — URINALYSIS, ROUTINE W REFLEX MICROSCOPIC
Ketones, ur: 40 mg/dL — AB
Nitrite: NEGATIVE
Protein, ur: 30 mg/dL — AB

## 2012-10-18 LAB — PREGNANCY, URINE: Preg Test, Ur: NEGATIVE

## 2012-10-18 MED ORDER — SODIUM CHLORIDE 0.9 % IV BOLUS (SEPSIS)
1000.0000 mL | Freq: Once | INTRAVENOUS | Status: AC
  Administered 2012-10-18: 1000 mL via INTRAVENOUS

## 2012-10-18 MED ORDER — ONDANSETRON HCL 4 MG/2ML IJ SOLN
4.0000 mg | Freq: Once | INTRAMUSCULAR | Status: AC
  Administered 2012-10-18: 4 mg via INTRAVENOUS
  Filled 2012-10-18: qty 2

## 2012-10-18 MED ORDER — PHENAZOPYRIDINE HCL 100 MG PO TABS
200.0000 mg | ORAL_TABLET | Freq: Once | ORAL | Status: AC
  Administered 2012-10-18: 200 mg via ORAL
  Filled 2012-10-18: qty 2

## 2012-10-18 MED ORDER — NITROFURANTOIN MACROCRYSTAL 100 MG PO CAPS
100.0000 mg | ORAL_CAPSULE | Freq: Once | ORAL | Status: AC
  Administered 2012-10-18: 100 mg via ORAL
  Filled 2012-10-18: qty 1

## 2012-10-18 MED ORDER — NITROFURANTOIN MACROCRYSTAL 100 MG PO CAPS: 100.0000 mg | ORAL_CAPSULE | Freq: Four times a day (QID) | ORAL | Status: DC

## 2012-10-18 MED ORDER — PHENAZOPYRIDINE HCL 200 MG PO TABS: 200.0000 mg | ORAL_TABLET | Freq: Three times a day (TID) | ORAL | Status: DC

## 2012-10-18 MED ORDER — MORPHINE SULFATE 4 MG/ML IJ SOLN
2.0000 mg | Freq: Once | INTRAMUSCULAR | Status: AC
Start: 2012-10-18 — End: 2012-10-18
  Administered 2012-10-18: 06:00:00 via INTRAVENOUS
  Filled 2012-10-18: qty 1

## 2012-10-18 MED ORDER — SODIUM CHLORIDE 0.9 % IV SOLN
Freq: Once | INTRAVENOUS | Status: AC
  Administered 2012-10-18: 06:00:00 via INTRAVENOUS

## 2012-10-18 MED ORDER — ONDANSETRON 4 MG PO TBDP: 4.0000 mg | ORAL_TABLET | Freq: Three times a day (TID) | ORAL | Status: DC | PRN

## 2012-10-18 NOTE — ED Provider Notes (Signed)
History    CSN: [id number] Arrival date & time 10/18/12  4098  First MD Initiated Contact with Patient 10/18/12 [phone]     Chief Complaint  Patient presents with  . Emesis  . Abdominal Pain   (Consider location/radiation/quality/duration/timing/severity/associated sxs/prior Treatment) HPI HPI Comments: [name] is a 27 y.o. female who presents to the Emergency Department complaining of abdominal cramping pain that began at 2200 last night. At 0030 she started vomiting and has been vomiting since. No diarrhea. Denies fever, chills, shortness of breath, cough. She has taken no medicines.    Past Medical History  Diagnosis Date  . No pertinent past medical history    Past Surgical History  Procedure Laterality Date  . Foot surgery  as child    arch corrections  . Cesarean section N/A 05/19/2012    Procedure: Primary Cesarean Section;  Surgeon: Lavina Hamman, MD;  Location: [facility];  Service: Obstetrics;  Laterality: N/A;  Primary Cesarean Section   No family history on file. History  Substance Use Topics  . Smoking status: Never Smoker   . Smokeless tobacco: Not on file  . Alcohol Use: No   OB History   Grav Para Term Preterm Abortions TAB SAB Ect Mult Living   3 3 3  0 0 0 0 0 0 2     Review of Systems  Constitutional: Negative for fever.       10 Systems reviewed and are negative for acute change except as noted in the HPI.  HENT: Negative for congestion.   Eyes: Negative for discharge and redness.  Respiratory: Negative for cough and shortness of breath.   Cardiovascular: Negative for chest pain.  Gastrointestinal: Positive for nausea, vomiting and diarrhea. Negative for abdominal pain.  Musculoskeletal: Negative for back pain.  Skin: Negative for rash.  Neurological: Negative for syncope, numbness and headaches.  Psychiatric/Behavioral:       No behavior change.    Allergies  Bee venom; Mango flavor; Peanut-containing drug products; and  Penicillins  Home Medications   Current Outpatient Rx  Name  Route  Sig  Dispense  Refill  . ibuprofen (ADVIL,MOTRIN) 600 MG tablet   Oral   Take 1 tablet (600 mg total) by mouth every 6 (six) hours.   30 tablet   1   . oxyCODONE-acetaminophen (PERCOCET/ROXICET) 5-325 MG per tablet   Oral   Take 1-2 tablets by mouth every 4 (four) hours as needed.   30 tablet   0   . Prenatal Vit-Fe Fumarate-FA (MULTIVITAMIN-PRENATAL) 27-0.8 MG TABS   Oral   Take 1 tablet by mouth daily.          BP 129/75  Pulse 91  Temp(Src) 98.2 F (36.8 C)  Resp 18  Ht 5\' 4"  (1.626 m)  Wt 164 lb (74.39 kg)  BMI 28.14 kg/m2  SpO2 96%  LMP 09/21/2012  Breastfeeding? No Physical Exam  Nursing note and vitals reviewed. Constitutional: She appears well-developed and well-nourished.  Awake, alert, nontoxic appearance.  HENT:  Head: Normocephalic and atraumatic.  Right Ear: External ear normal.  Left Ear: External ear normal.  Eyes: EOM are normal. Pupils are equal, round, and reactive to light.  Neck: Normal range of motion. Neck supple.  Cardiovascular: Normal rate and intact distal pulses.   Pulmonary/Chest: Effort normal and breath sounds normal. She exhibits no tenderness.  Abdominal: Soft. Bowel sounds are normal. There is no tenderness. There is no rebound.  Musculoskeletal: Normal range of motion. She exhibits no  tenderness.  Baseline ROM, no obvious new focal weakness.  Neurological:  Mental status and motor strength appears baseline for patient and situation.  Skin: No rash noted.  Psychiatric: She has a normal mood and affect.    ED Course  Procedures (including critical care time) Results for orders placed during the hospital encounter of 10/18/12  URINALYSIS, ROUTINE W REFLEX MICROSCOPIC      Result Value Range   Color, Urine YELLOW  YELLOW   APPearance HAZY (*) CLEAR   Specific Gravity, Urine 1.025  1.005 - 1.030   pH 6.0  5.0 - 8.0   Glucose, UA NEGATIVE  NEGATIVE mg/dL    Hgb urine dipstick TRACE (*) NEGATIVE   Bilirubin Urine SMALL (*) NEGATIVE   Ketones, ur 40 (*) NEGATIVE mg/dL   Protein, ur 30 (*) NEGATIVE mg/dL   Urobilinogen, UA 2.0 (*) 0.0 - 1.0 mg/dL   Nitrite NEGATIVE  NEGATIVE   Leukocytes, UA SMALL (*) NEGATIVE  PREGNANCY, URINE      Result Value Range   Preg Test, Ur NEGATIVE  NEGATIVE  URINE MICROSCOPIC-ADD ON      Result Value Range   Squamous Epithelial / LPF FEW (*) RARE   WBC, UA 7-10  <3 WBC/hpf   RBC / HPF 3-6  <3 RBC/hpf   Bacteria, UA MANY (*) RARE   Urine-Other MUCOUS PRESENT       MDM  Patient presents with nausea, vomiting and diarrhea. Given morphine and zofran with relief. UA positive for infection. Given antibiotic and pyridium. Reviewed results with the patient. Pt stable in ED with no significant deterioration in condition.The patient appears reasonably screened and/or stabilized for discharge and I doubt any other medical condition or other [facility] requiring further screening, evaluation, or treatment in the ED at this time prior to discharge.  MDM Reviewed: nursing note and vitals Interpretation: labs     Nicoletta Dress. Colon Branch, MD 10/18/12 [phone]

## 2012-10-18 NOTE — ED Notes (Signed)
Stomach started hurting around 10 pm last night, started vomiting at 0030 per pt.

## 2012-10-19 LAB — URINE CULTURE
Colony Count: NO GROWTH
Culture: NO GROWTH

## 2013-05-30 IMAGING — US US OB FOLLOW-UP
1 series · 12 of 28 positions shown · non-contrast
Comparison: none

[Series 1: us ob follow-up · 0.21mm/px · 12 of 48 slices shown]
[im 2/48]
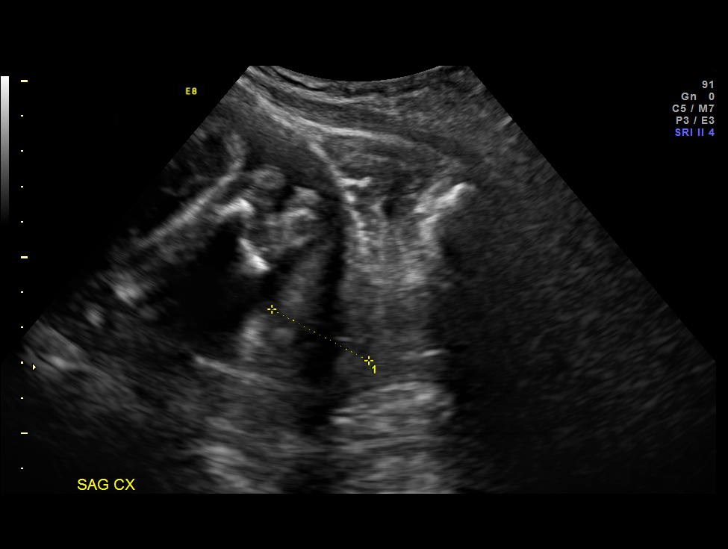
[im 6/48]
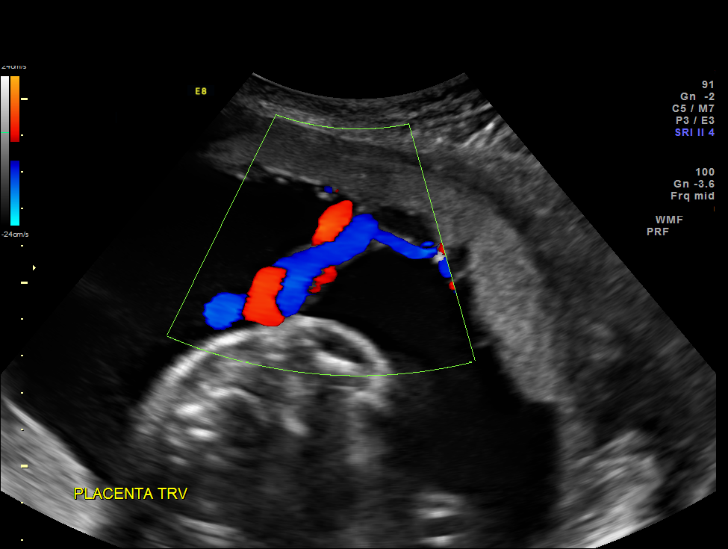
[im 9/48]
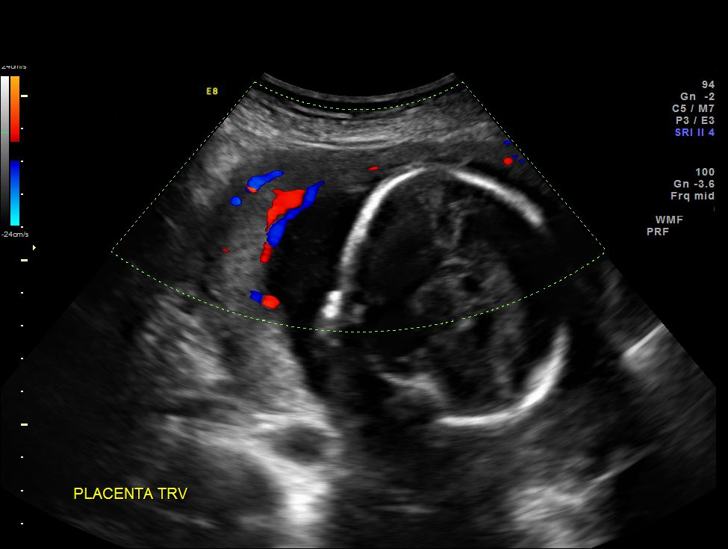
[im 14/48]
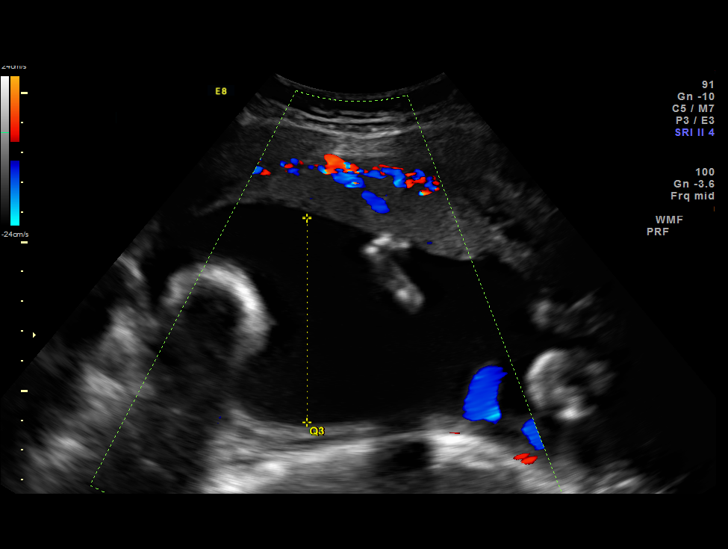
[im 18/48]
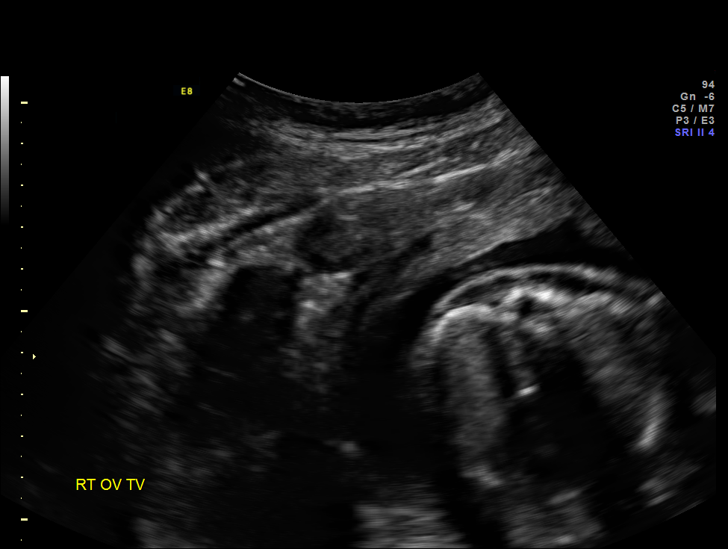
[im 21/48]
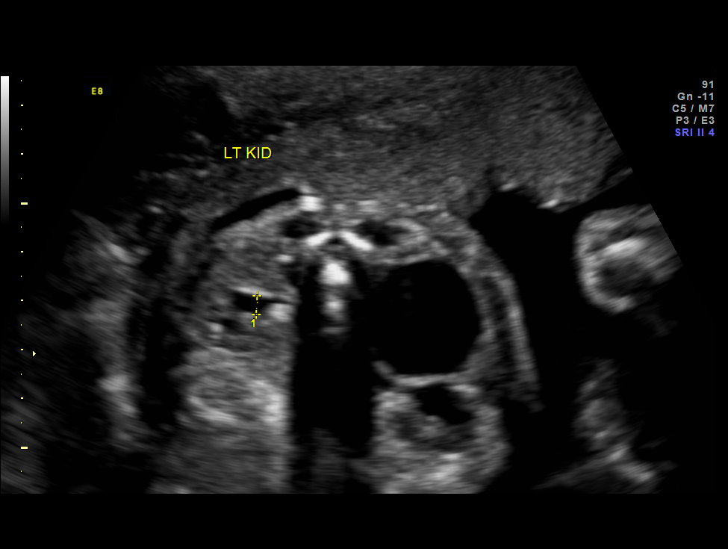
[im 27/48]
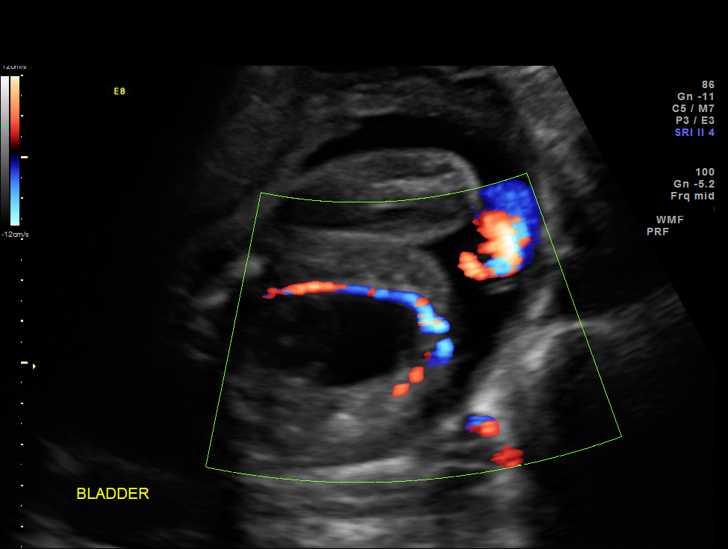
[im 30/48]
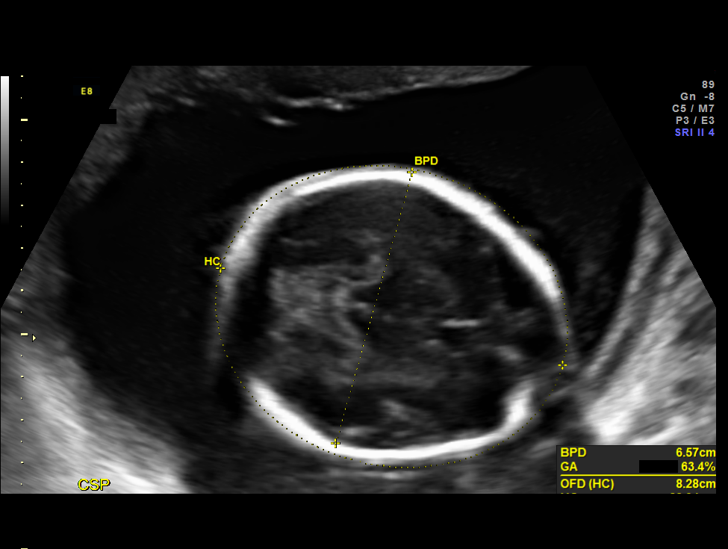
[im 34/48]
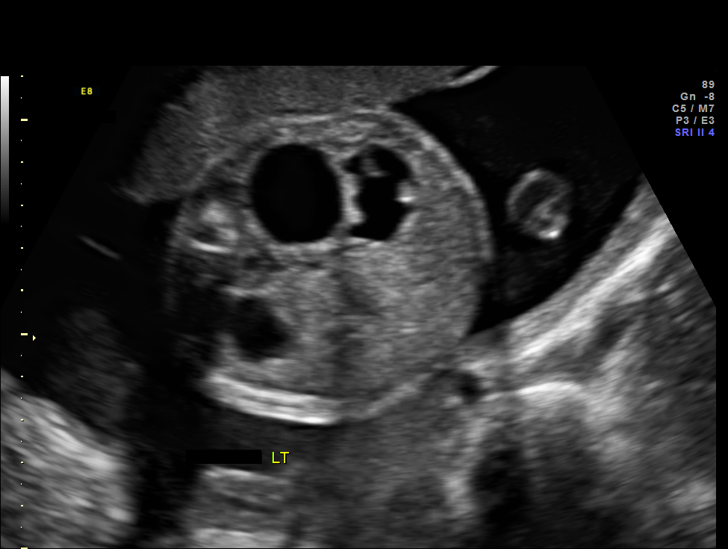
[im 39/48]
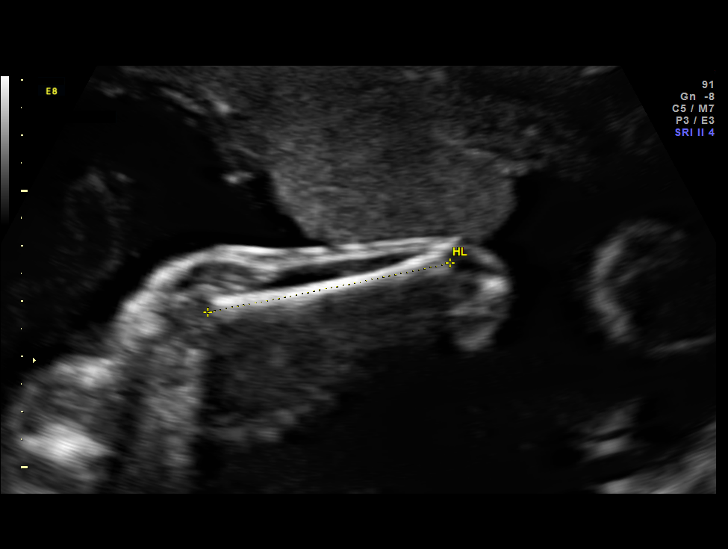
[im 42/48]
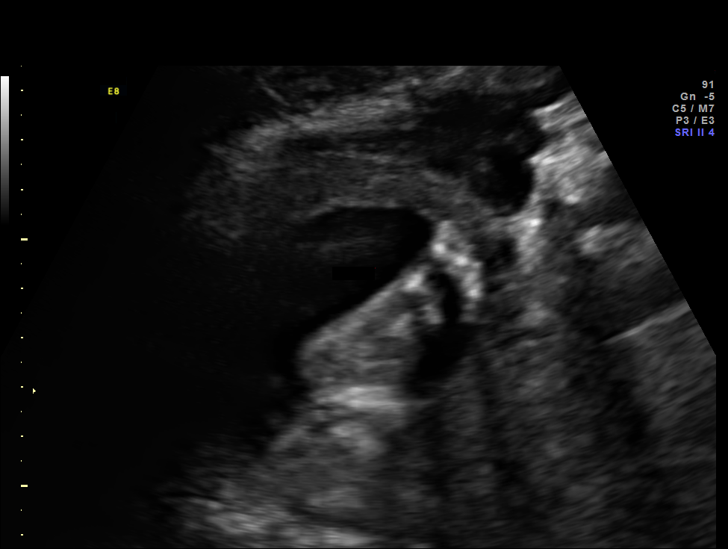
[im 46/48]
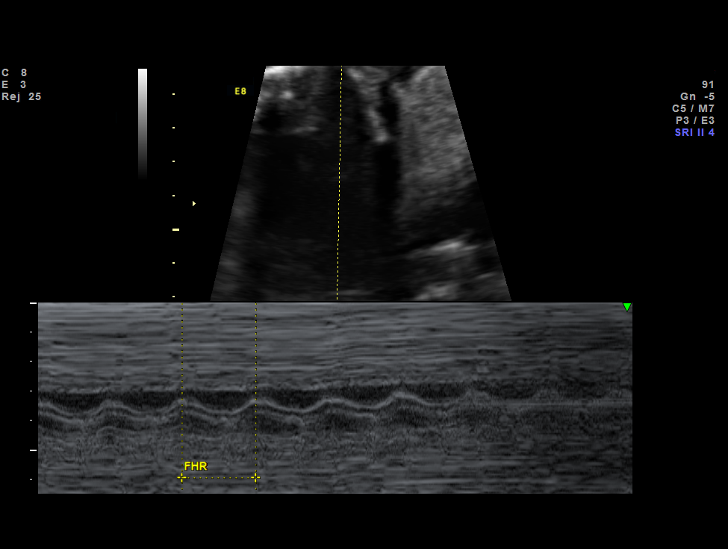

[12 of 28 positions shown; findings below may reference images not displayed]

OBSTETRICS REPORT
                      (Signed Final 02/15/2012 [DATE])

Service(s) Provided

 US OB FOLLOW UP                                       76816.1
Indications

 Unilateral Hydronephrosis, Ureterocele
 Echogenic intracardiac focus of the heart  (EIF)
Fetal Evaluation

 Num Of Fetuses:    1
 Fetal Heart Rate:  136                          bpm
 Cardiac Activity:  Observed
 Presentation:      Breech
 Placenta:          Anterior with post succ
                    lobe, above os
 P. Cord            Visualized, central,
 Insertion:         anterior

 Amniotic Fluid
 AFI FV:      Subjectively upper-normal
                                             Larg Pckt:     7.8  cm
Biometry

 BPD:       66  mm     G. Age:  26w 4d                CI:         80.9   70 - 86
 OFD:     81.6  mm                                    FL/HC:      19.8   18.6 -

 HC:     237.4  mm     G. Age:  25w 6d       24  %    HC/AC:      1.01   1.04 -

 AC:     234.5  mm     G. Age:  27w 5d       91  %    FL/BPD:     71.4   71 - 87
 FL:      47.1  mm     G. Age:  25w 5d       32  %    FL/AC:      20.1   20 - 24
 HUM:     44.1  mm     G. Age:  26w 1d       54  %
 CER:     29.6  mm     G. Age:  26w 2d       59  %

 Est. FW:     985  gm      2 lb 3 oz     72  %
Gestational Age

 LMP:           25w 6d        Date:  08/18/11                 EDD:   05/24/12
 U/S Today:     26w 3d                                        EDD:   05/20/12
 Best:          25w 6d     Det. By:  LMP  (08/18/11)          EDD:   05/24/12
Anatomy
 Cranium:          Appears normal         Aortic Arch:      Appears normal
 Fetal Cavum:      Appears normal         Ductal Arch:      Previously seen
 Ventricles:       Appears normal         Diaphragm:        Previously seen
 Choroid Plexus:   Previously seen        Stomach:          Appears normal, left
                                                            sided
 Cerebellum:       Appears normal         Abdomen:          Appears normal
 Posterior Fossa:  Appears normal         Abdominal Wall:   Previously seen
 Nuchal Fold:      Not applicable (>20    Cord Vessels:     Previously seen
                   wks GA)
 Face:             Orbits and profile     Kidneys:          Abnormal, see
                   previously seen
                                                            comments
 Lips:             Previously seen        Bladder:          Ureterocele
 Heart:            Previously seen        Spine:            Previously seen
 RVOT:             Previously seen        Lower             Previously seen
                                          Extremities:
 LVOT:             Previously seen        Upper             Previously seen
                                          Extremities:

 Other:  Male gender. Heels and 5th digit previously seen. Nasal bone
         previously visualized. Technically difficult due to fetal position.
Targeted Anatomy

 Fetal Central Nervous System
 Cisterna Magna:
Cervix Uterus Adnexa

 Cervical Length:    3.1      cm

 Cervix:       Normal appearance by transabdominal scan.
 Left Ovary:    Within normal limits.
 Right Ovary:   Within normal limits.
 Adnexa:     No abnormality visualized.
Impression

 Single IUP at 25 [DATE] weeks
 Right hydronephrosis (garde IV) and hydroureter- little
 residual kidney tissue visible
 Ureterocele, possible duplicated collecting system on the right
 Normal left kidney
 Interval growth is appropriate (72nd %tile)
 Subjectively increased amniotic fluid volume

 Cell free fetal DNA testing low risk for aneuploidy
Recommendations

 Recommend follow up ultrasound in 4 weeks for growth and
 to reevaluate the fetal kidneys
 Will make arrangements for Peds Urology consult

 questions or concerns.

## 2013-06-27 IMAGING — US US OB FOLLOW-UP
1 series · 12 of 28 positions shown · non-contrast
Comparison: none

[Series 1: us ob follow-up · 0.23mm/px · 12 of 58 slices shown]
[im 3/58]
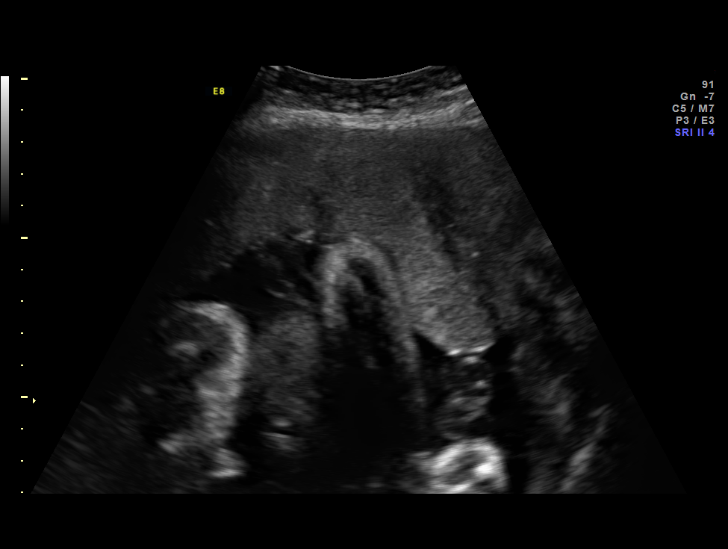
[im 7/58]
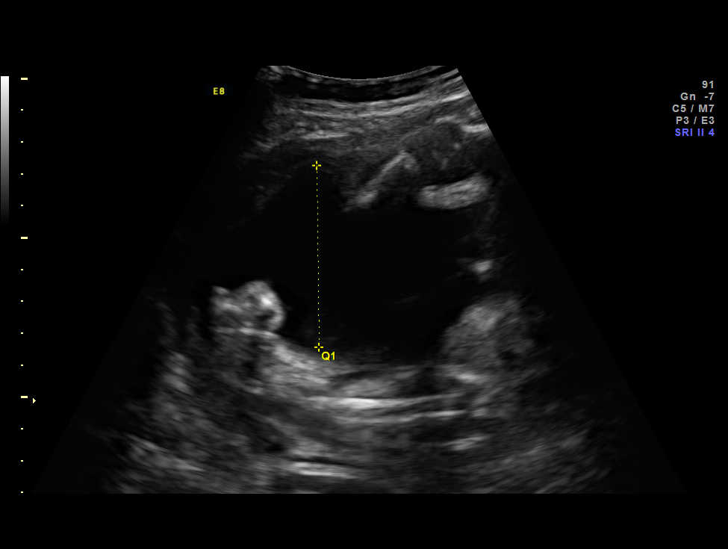
[im 11/58]
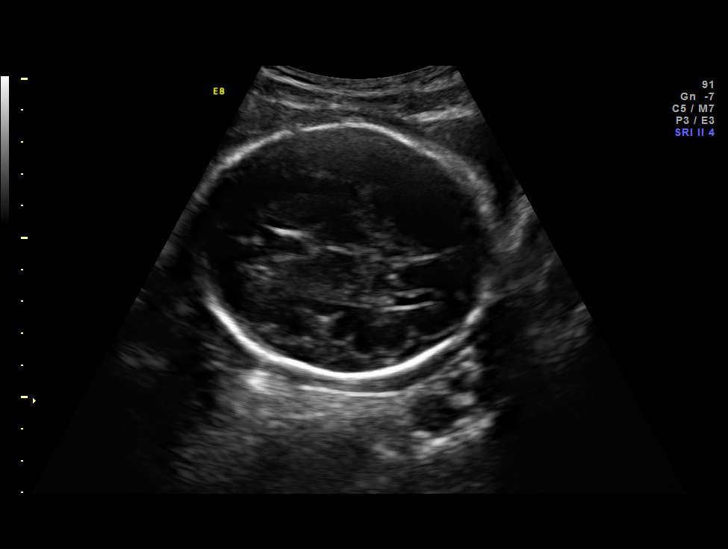
[im 17/58]
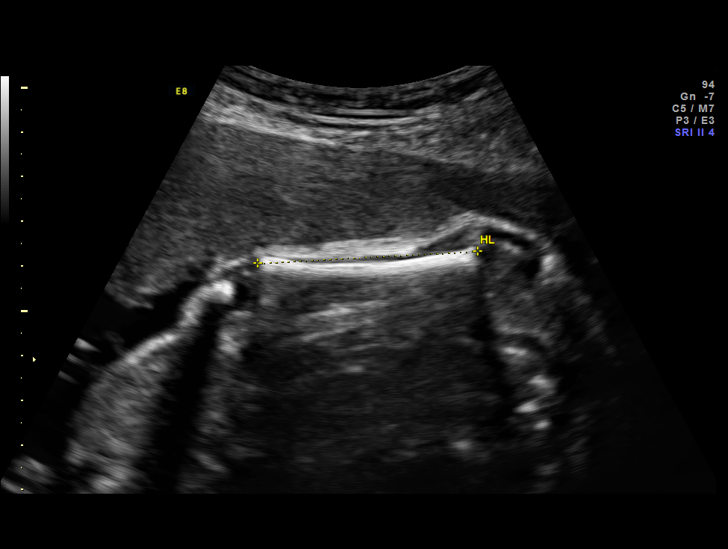
[im 22/58]
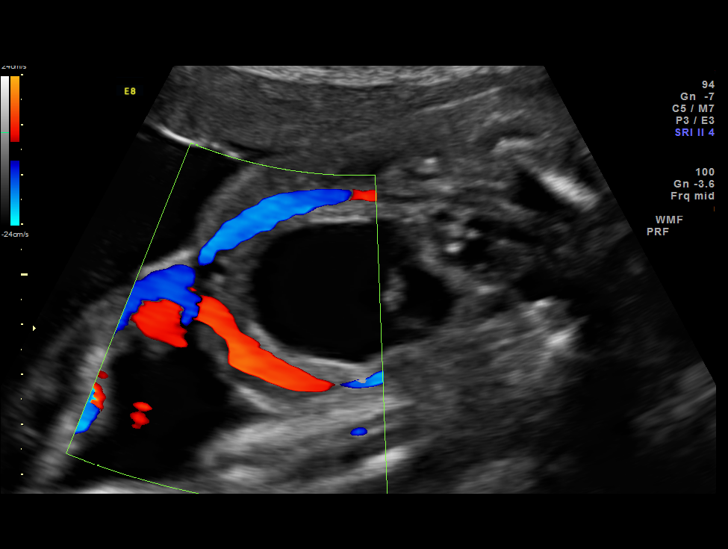
[im 26/58]
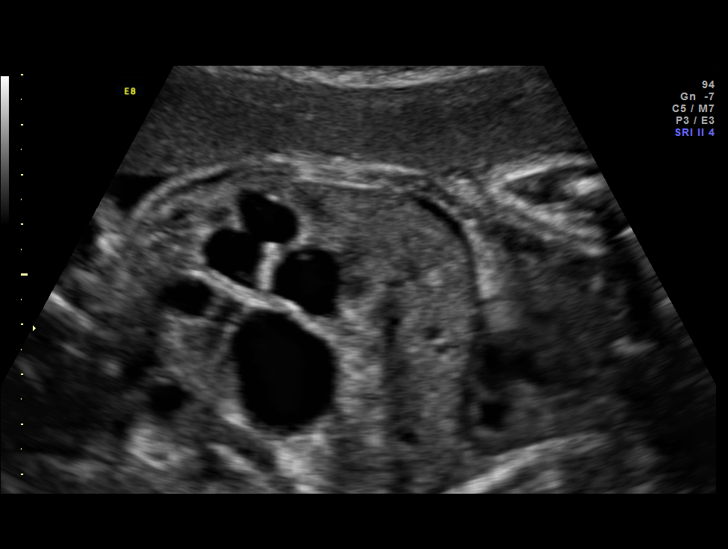
[im 32/58]
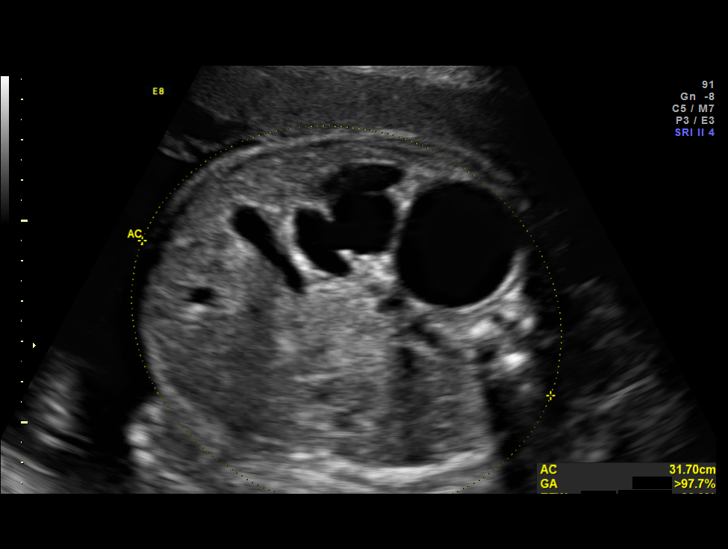
[im 36/58]
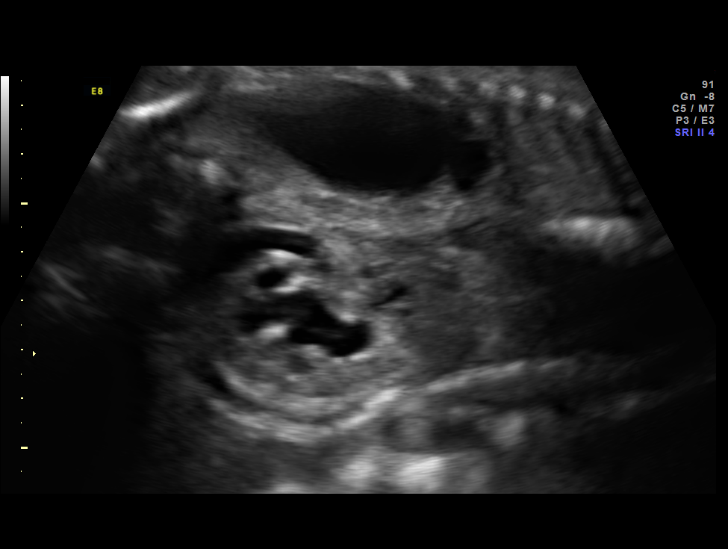
[im 41/58]
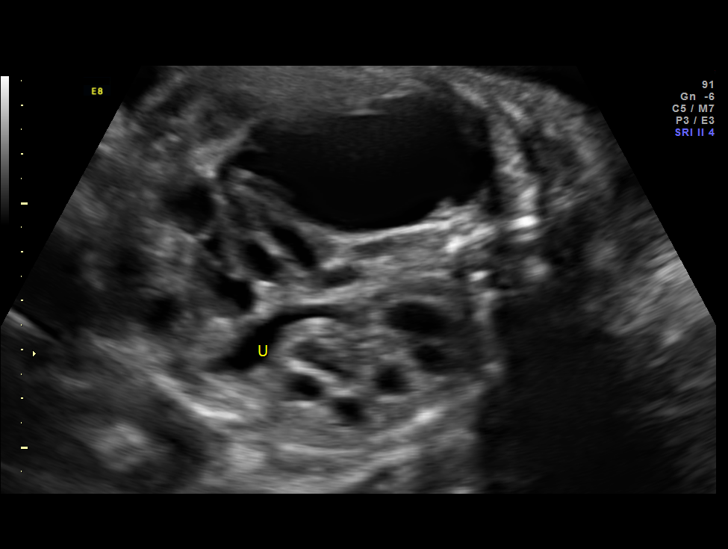
[im 47/58]
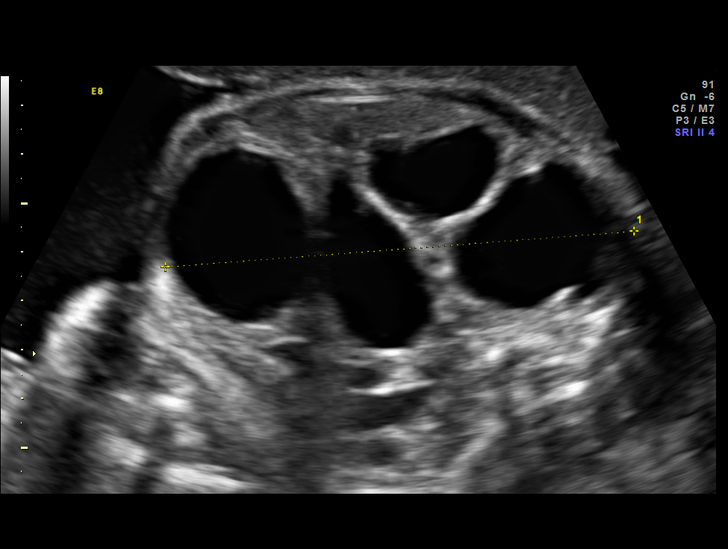
[im 51/58]
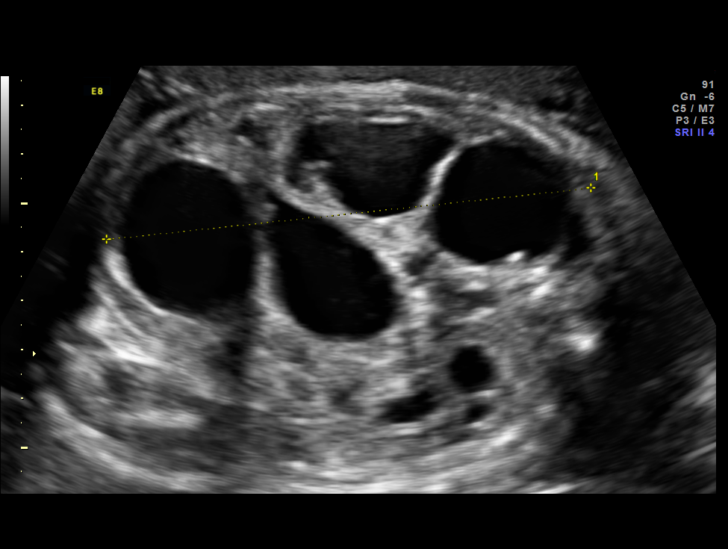
[im 55/58]
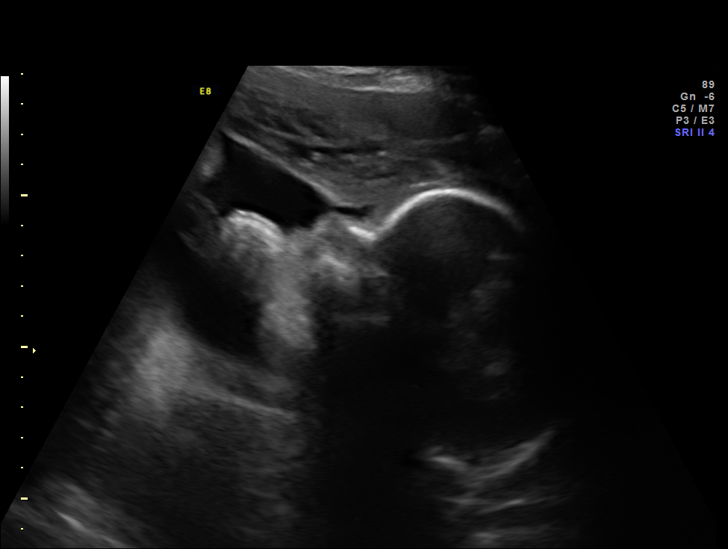

[12 of 28 positions shown; findings below may reference images not displayed]

OBSTETRICS REPORT
                      (Signed Final 03/14/2012 [DATE])

Service(s) Provided

 US OB FOLLOW UP                                       76816.1
Indications

 Unilateral Hydronephrosis, Ureterocele
 Echogenic intracardiac focus of the heart  (EIF)
Fetal Evaluation

 Num Of Fetuses:    1
 Fetal Heart Rate:  136                          bpm
 Cardiac Activity:  Observed
 Presentation:      Cephalic
 Placenta:          Anterior with post succ
                    lobe, above os
 P. Cord            Previously Visualized
 Insertion:

 Amniotic Fluid
 AFI FV:      Subjectively upper-normal
 AFI Sum:     13.73   cm       44  %Tile     Larg Pckt:    5.71  cm
 RUQ:   5.71    cm   RLQ:    3.48   cm    LUQ:   1.73    cm   LLQ:    2.81   cm
Biometry

 BPD:     79.2  mm     G. Age:  31w 5d                CI:         79.2   70 - 86
 OFD:      100  mm                                    FL/HC:      19.6   19.2 -

 HC:     283.4  mm     G. Age:  31w 1d       51  %    HC/AC:      0.88   0.99 -

 AC:     321.6  mm     G. Age:  36w 0d     > 97  %    FL/BPD:     70.1   71 - 87
 FL:      55.5  mm     G. Age:  29w 2d       20  %    FL/AC:      17.3   20 - 24
 HUM:     49.2  mm     G. Age:  29w 0d       31  %

 Est. FW:    1929  gm    4 lb 13 oz    > 90  %
Gestational Age

 LMP:           29w 6d        Date:  08/18/11                 EDD:   05/24/12
 U/S Today:     32w 0d                                        EDD:   05/09/12
 Best:          29w 6d     Det. By:  LMP  (08/18/11)          EDD:   05/24/12
Anatomy
 Cranium:          Previously seen        Aortic Arch:      Previously seen
 Fetal Cavum:      Previously seen        Ductal Arch:      Previously seen
 Ventricles:       Appears normal         Diaphragm:        Appears normal
 Choroid Plexus:   Previously seen        Stomach:          Appears normal, left
                                                            sided
 Cerebellum:       Previously seen        Abdomen:          Appears normal
 Posterior Fossa:  Previously seen        Abdominal Wall:   Appears nml (cord
                                                            insert, abd wall)
 Nuchal Fold:      Not applicable (>20    Cord Vessels:     Appears normal (3
                   wks GA)                                  vessel cord)
 Face:             Orbits and profile     Kidneys:          Abnormal, see
                   previously seen
                                                            comments
 Lips:             Previously seen        Bladder:          Ureterocele
 Heart:            Previously seen        Spine:            Previously seen
 RVOT:             Previously seen        Lower             Previously seen
                                          Extremities:
 LVOT:             Previously seen        Upper             Previously seen
                                          Extremities:

 Other:  Male gender. Heels and 5th digit previously seen.
Cervix Uterus Adnexa

 Cervix:       Not visualized (advanced GA >16wks)
Comments

 Uterocele with grade IV right sided and grade III left sided
 hydronephrosis is again noted on today's scan.  Amniotic
 fluid volume remains normal.  Pediatric Urology consult has
 been completed.

 Fetal growth is measuring greater than expected with an
 EFW of >90%.  However, EFW is likely inaccurate due to the
 dramatically increased AC secondary to the hydroneprhosis.
Impression

 Single living intrauterine pregnancy at 29 weeks 6 days.
 Greater than expected fetal growth (see comments).
 Normal amniotic fluid volume.
 Uterocele with grade IV right sided and grade III left sided
 hydronephrosis.
Recommendations

 Recommend follow up ultrasound in 4 weeks for growth and
 to reevaluate the fetal kidneys.

 questions or concerns.
                Gin, Iggy

## 2013-07-31 IMAGING — US US FETAL BPP W/O NONSTRESS
1 series · 13 of 28 positions shown · non-contrast
Comparison: none

OBSTETRICS REPORT
                      (Signed Final 04/17/2012 [DATE])

Service(s) Provided
Indications
 Unilateral Hydronephrosis, Ureterocele
 Echogenic intracardiac focus of the heart  (EIF)
Fetal Evaluation
 Num Of Fetuses:    1
 Fetal Heart Rate:  139                          bpm
 Cardiac Activity:  Observed
 Presentation:      Cephalic
 Placenta:          Anterior, above cervical os
 P. Cord            Previously Visualized
 Insertion:
 Amniotic Fluid
 AFI FV:      Subjectively within normal limits
 AFI Sum:     15.62   cm       57  %Tile     Larg Pckt:    4.75  cm
 RUQ:   4.75    cm   RLQ:    2.64   cm    LUQ:   4.21    cm   LLQ:    4.02   cm
Biophysical Evaluation
 Amniotic F.V:   Within normal limits       F. Tone:        Observed
 F. Movement:    Observed                   Score:          [DATE]
 F. Breathing:   Observed
Gestational Age
 LMP:           34w 5d        Date:  08/18/11                 EDD:   05/24/12
 Best:          34w 5d     Det. By:  LMP  (08/18/11)          EDD:   05/24/12
Anatomy
 Stomach:          Appears normal, left   Bladder:          Appears normal
                   sided
 Kidneys:          Hydronephrosis
Cervix Uterus Adnexa
 Cervix:       Not visualized (advanced GA >03wks)
Impression
INDICATION: 26 yr old [id number] at 51w8d with fetus with
 bilateral hydronephrosis and ureterocele for biophysical
 profile.

[Series 1: us fetal bpp w/o nonstress · 13 of 32 slices shown]
[im 2/32]
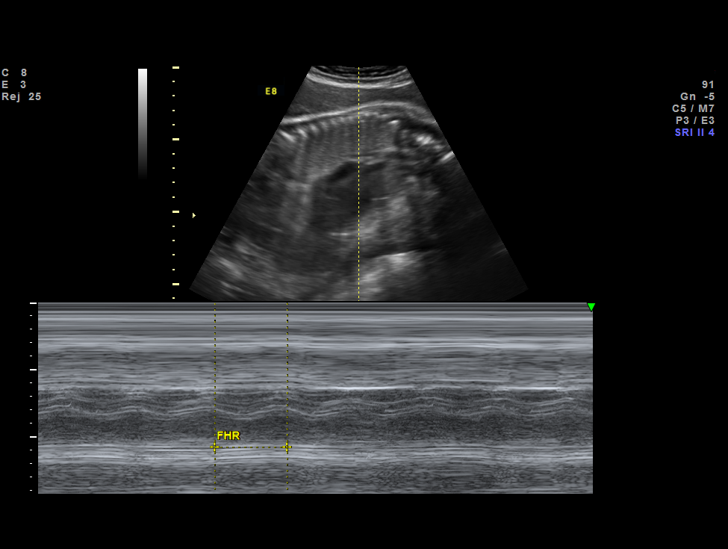
[im 4/32]
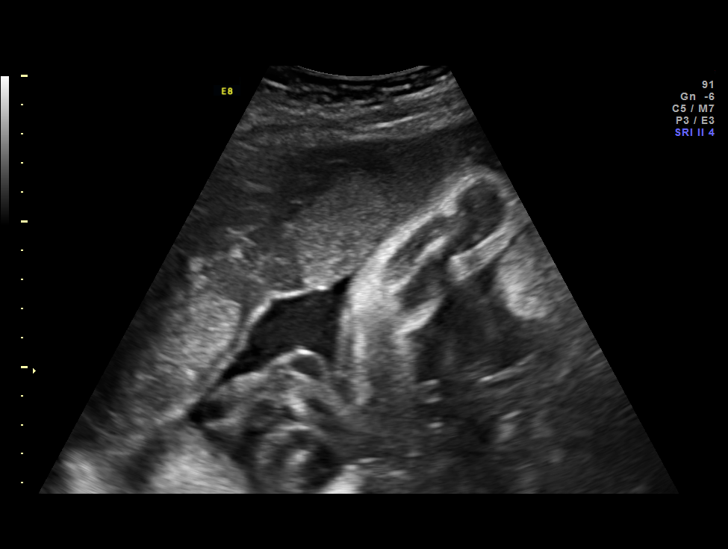
[im 6/32]
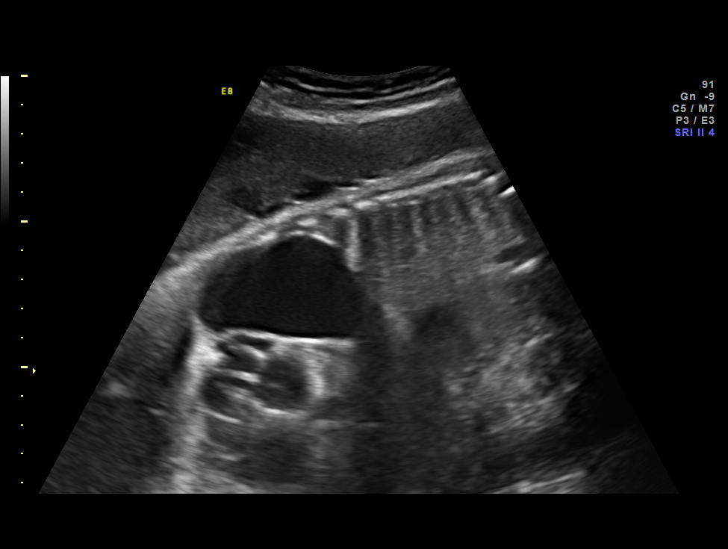
[im 9/32]
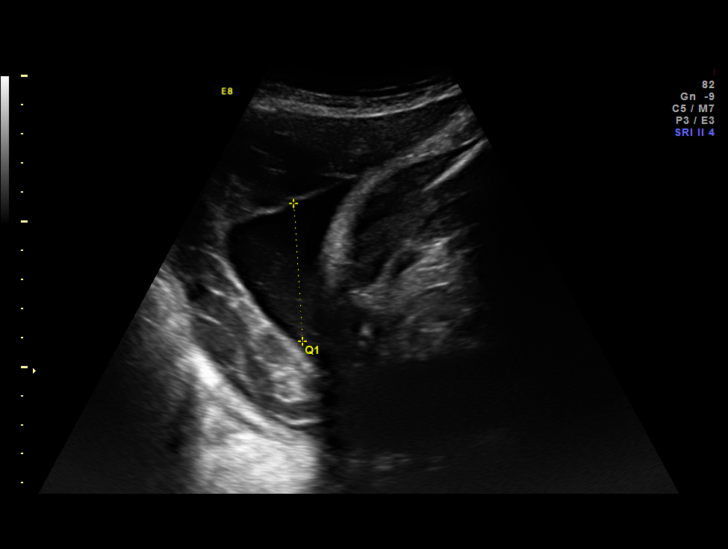
[im 11/32]
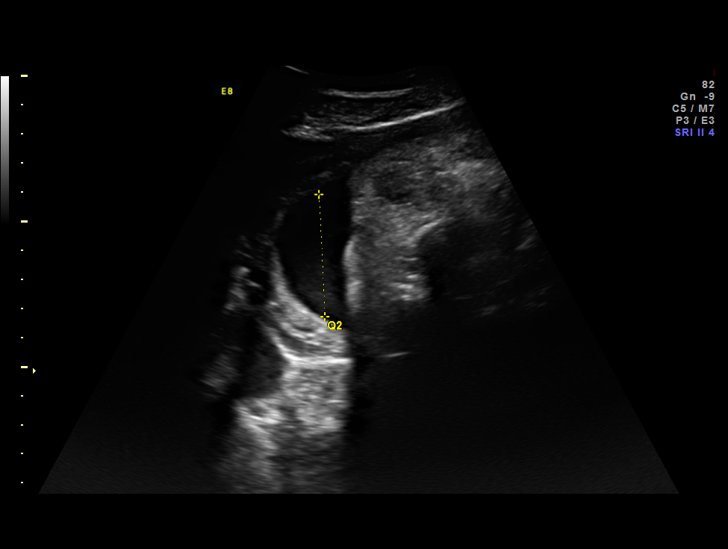
[im 13/32]
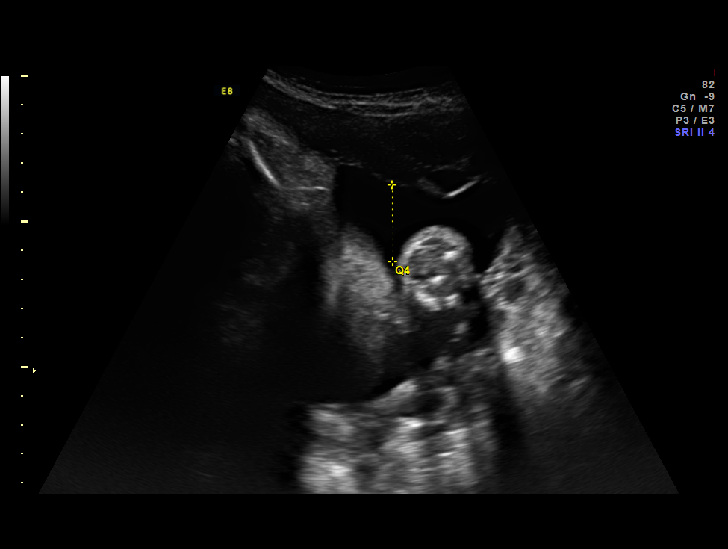
[im 17/32]
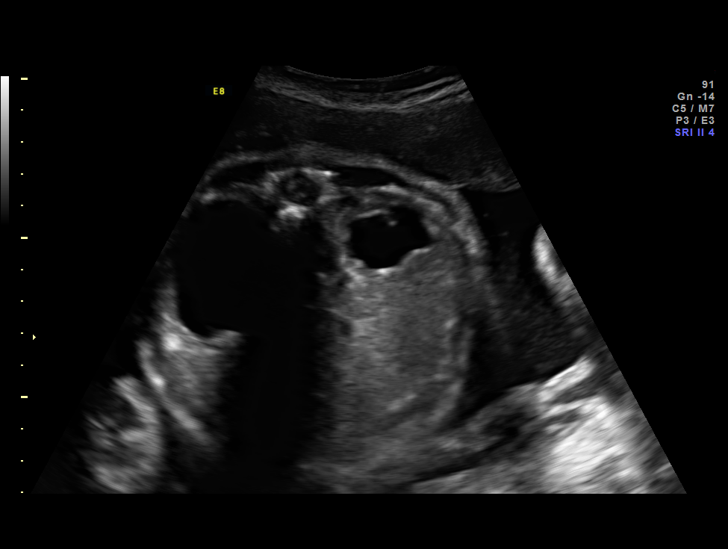
[im 19/32]
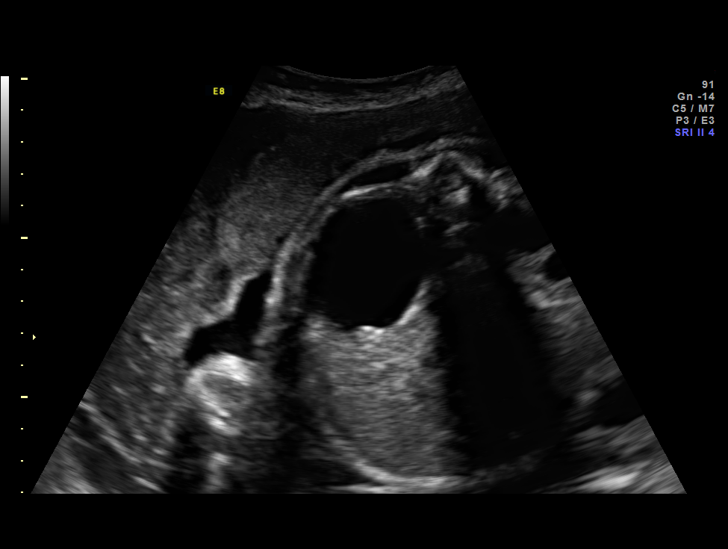
[im 21/32]
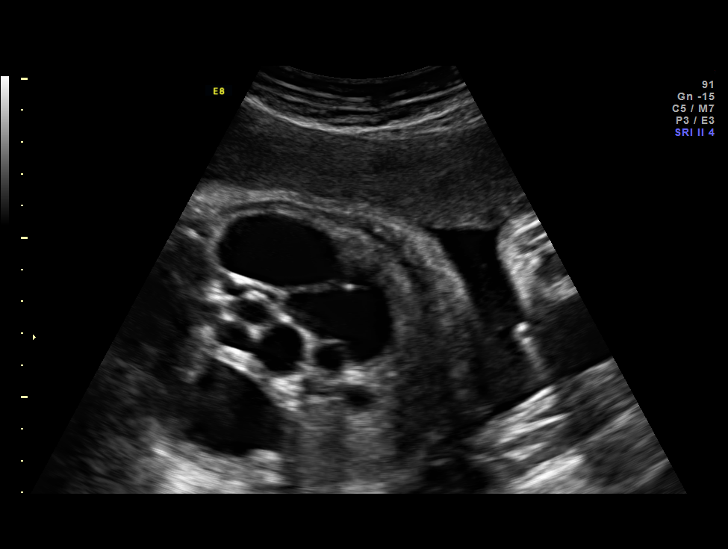
[im 23/32]
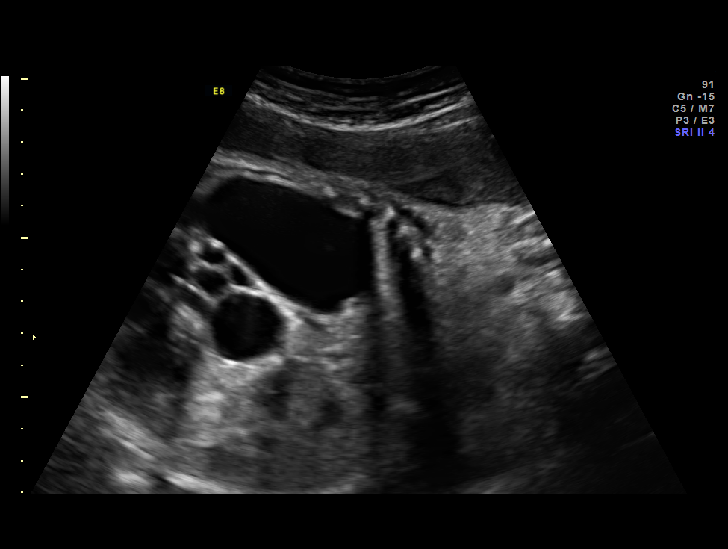
[im 26/32]
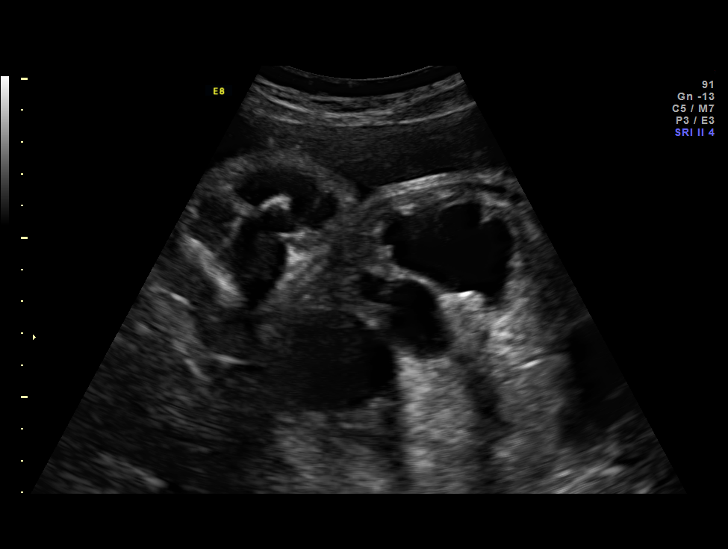
[im 28/32]
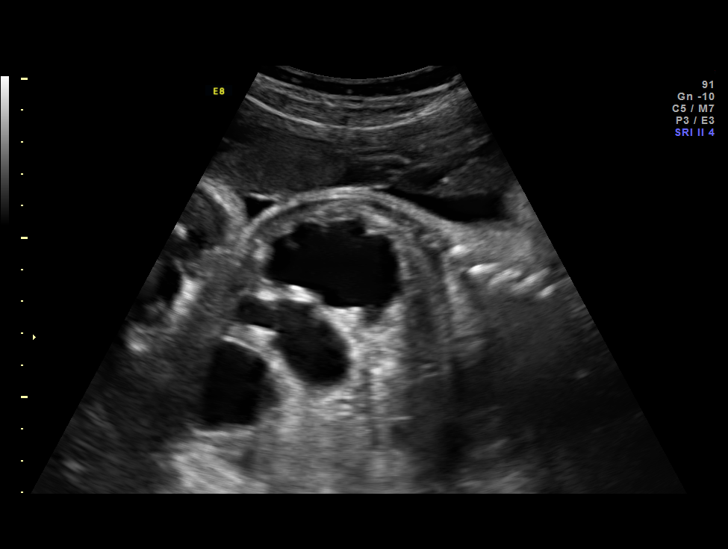
[im 30/32]
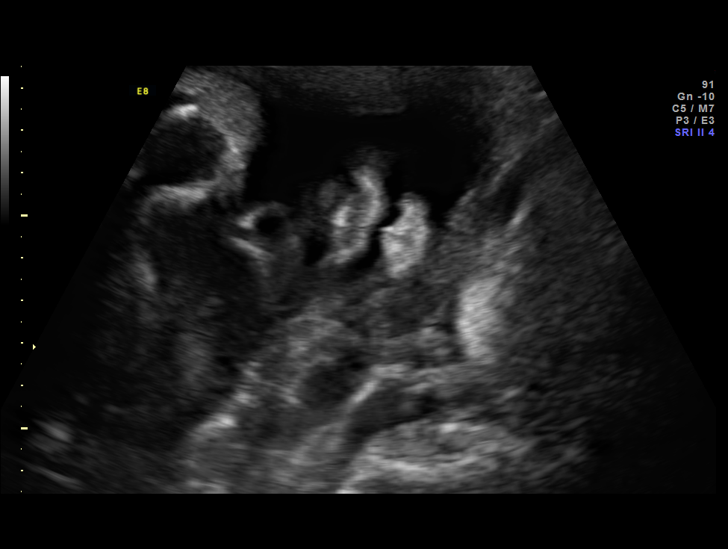

[13 of 28 positions shown; findings below may reference images not displayed]

FINDINGS: 1. Single intrauterine pregnancy.
 2. Anterior placenta without evidence of previa.
 3. Normal amniotic fluid index.
 4. Again seen is bilateral hydronephrosis; right side>left side.
 There is also hydroureter on the right side.
 5. The ureterocele is not seen on today's exam.
 6. The bladder and stomach appear normal.
 7. Normal biophysical profile of [DATE].
Recommendations

 1. Genitourinary anomalies:
 - previously counseled
 - has met with Pediatric urology
 - continue weekly BPP and evaluation of amniotic fluid
 - fetal growth in 2 weeks to aid in delivery recommendations
 - had negative [id number] screen
 2. Recommend follow up in 1 week

 questions or concerns.

## 2014-02-08 ENCOUNTER — Encounter (HOSPITAL_COMMUNITY): Payer: Self-pay

## 2014-04-17 ENCOUNTER — Encounter (HOSPITAL_COMMUNITY): Payer: Self-pay | Admitting: Emergency Medicine

## 2014-04-17 ENCOUNTER — Emergency Department (HOSPITAL_COMMUNITY)
Admission: EM | Admit: 2014-04-17 | Discharge: 2014-04-17 | Disposition: A | Discharge status: Home / Self Care | Payer: Self-pay | Attending: Emergency Medicine | Admitting: Emergency Medicine

## 2014-04-17 DIAGNOSIS — IMO0001 Reserved for inherently not codable concepts without codable children: Secondary | ICD-10-CM

## 2014-04-17 DIAGNOSIS — X58XXXA Exposure to other specified factors, initial encounter: Secondary | ICD-10-CM | POA: Insufficient documentation

## 2014-04-17 DIAGNOSIS — Y9389 Activity, other specified: Secondary | ICD-10-CM | POA: Insufficient documentation

## 2014-04-17 DIAGNOSIS — Y9289 Other specified places as the place of occurrence of the external cause: Secondary | ICD-10-CM | POA: Insufficient documentation

## 2014-04-17 DIAGNOSIS — Y998 Other external cause status: Secondary | ICD-10-CM | POA: Insufficient documentation

## 2014-04-17 DIAGNOSIS — S3141XA Laceration without foreign body of vagina and vulva, initial encounter: Secondary | ICD-10-CM | POA: Insufficient documentation

## 2014-04-17 MED ORDER — NAPROXEN 500 MG PO TABS: 500.0000 mg | ORAL_TABLET | Freq: Two times a day (BID) | ORAL | Status: DC

## 2014-04-17 MED ORDER — HYDROCODONE-ACETAMINOPHEN 5-325 MG PO TABS: 1.0000 | ORAL_TABLET | ORAL | Status: DC | PRN

## 2014-04-17 NOTE — Discharge Instructions (Signed)
Sit in warm tubs of water for comfort, take the pain medication as directed. Apply Neosporin ointment to the area three times a day. No sexual intercourse until the area heals.
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
## 2015-04-24 ENCOUNTER — Encounter (HOSPITAL_COMMUNITY): Payer: Self-pay | Admitting: Emergency Medicine

## 2015-04-24 ENCOUNTER — Emergency Department (HOSPITAL_COMMUNITY)
Admission: EM | Admit: 2015-04-24 | Discharge: 2015-04-24 | Disposition: A | Discharge status: Home / Self Care | Payer: 59 | Attending: Emergency Medicine | Admitting: Emergency Medicine

## 2015-04-24 DIAGNOSIS — Y9389 Activity, other specified: Secondary | ICD-10-CM | POA: Insufficient documentation

## 2015-04-24 DIAGNOSIS — Y998 Other external cause status: Secondary | ICD-10-CM | POA: Insufficient documentation

## 2015-04-24 DIAGNOSIS — W268XXA Contact with other sharp object(s), not elsewhere classified, initial encounter: Secondary | ICD-10-CM | POA: Insufficient documentation

## 2015-04-24 DIAGNOSIS — S91115A Laceration without foreign body of left lesser toe(s) without damage to nail, initial encounter: Secondary | ICD-10-CM | POA: Insufficient documentation

## 2015-04-24 DIAGNOSIS — Z880 Allergy status to penicillin: Secondary | ICD-10-CM | POA: Insufficient documentation

## 2015-04-24 DIAGNOSIS — Y9289 Other specified places as the place of occurrence of the external cause: Secondary | ICD-10-CM | POA: Insufficient documentation

## 2015-04-24 DIAGNOSIS — Z23 Encounter for immunization: Secondary | ICD-10-CM | POA: Insufficient documentation

## 2015-04-24 DIAGNOSIS — Z791 Long term (current) use of non-steroidal anti-inflammatories (NSAID): Secondary | ICD-10-CM | POA: Insufficient documentation

## 2015-04-24 DIAGNOSIS — S91119A Laceration without foreign body of unspecified toe without damage to nail, initial encounter: Secondary | ICD-10-CM

## 2015-04-24 DIAGNOSIS — Z793 Long term (current) use of hormonal contraceptives: Secondary | ICD-10-CM | POA: Insufficient documentation

## 2015-04-24 MED ORDER — TETANUS-DIPHTH-ACELL PERTUSSIS 5-2.5-18.5 LF-MCG/0.5 IM SUSP
0.5000 mL | Freq: Once | INTRAMUSCULAR | Status: AC
  Administered 2015-04-24: 0.5 mL via INTRAMUSCULAR
  Filled 2015-04-24: qty 0.5

## 2015-04-24 MED ORDER — IBUPROFEN 800 MG PO TABS
800.0000 mg | ORAL_TABLET | Freq: Once | ORAL | Status: AC
  Administered 2015-04-24: 800 mg via ORAL
  Filled 2015-04-24: qty 1

## 2015-04-24 MED ORDER — ACETAMINOPHEN 325 MG PO TABS
650.0000 mg | ORAL_TABLET | Freq: Once | ORAL | Status: AC
  Administered 2015-04-24: 650 mg via ORAL
  Filled 2015-04-24: qty 2

## 2015-04-24 NOTE — Discharge Instructions (Signed)
Your wound was repaired with steri-strips. Please let these come off by themselves.Please see your MD or return to the ED if any signs of infection. Please adjust your records to indicate you had a tetanus shot today. Stitches, Staples, or Adhesive Wound Closure Health care providers use stitches (sutures), staples, and certain glue (skin adhesives) to hold skin together while it heals (wound closure). You may need this treatment after you have surgery or if you cut your skin accidentally. These methods help your skin to heal more quickly and make it less likely that you will have a scar. A wound may take several months to heal completely. The type of wound you have determines when your wound gets closed. In most cases, the wound is closed as soon as possible (primary skin closure). Sometimes, closure is delayed so the wound can be cleaned and allowed to heal naturally. This reduces the chance of infection. Delayed closure may be needed if your wound:  Is caused by a bite.  Happened more than 6 hours ago.  Involves loss of skin or the tissues under the skin.  Has dirt or debris in it that cannot be removed.  Is infected. WHAT ARE THE DIFFERENT KINDS OF WOUND CLOSURES? There are many options for wound closure. The one that your health care provider uses depends on how deep and how large your wound is. Adhesive Glue To use this type of glue to close a wound, your health care provider holds the edges of the wound together and paints the glue on the surface of your skin. You may need more than one layer of glue. Then the wound may be covered with a light bandage (dressing). This type of skin closure may be used for small wounds that are not deep (superficial). Using glue for wound closure is less painful than other methods. It does not require a medicine that numbs the area (local anesthetic). This method also leaves nothing to be removed. Adhesive glue is often used for children and on facial  wounds. Adhesive glue cannot be used for wounds that are deep, uneven, or bleeding. It is not used inside of a wound.  Adhesive Strips These strips are made of sticky (adhesive), porous paper. They are applied across your skin edges like a regular adhesive bandage. You leave them on until they fall off. Adhesive strips may be used to close very superficial wounds. They may also be used along with sutures to improve the closure of your skin edges.  Sutures Sutures are the oldest method of wound closure. Sutures can be made from natural substances, such as silk, or from synthetic materials, such as nylon and steel. They can be made from a material that your body can break down as your wound heals (absorbable), or they can be made from a material that needs to be removed from your skin (nonabsorbable). They come in many different strengths and sizes. Your health care provider attaches the sutures to a steel needle on one end. Sutures can be passed through your skin, or through the tissues beneath your skin. Then they are tied and cut. Your skin edges may be closed in one continuous stitch or in separate stitches. Sutures are strong and can be used for all kinds of wounds. Absorbable sutures may be used to close tissues under the skin. The disadvantage of sutures is that they may cause skin reactions that lead to infection. Nonabsorbable sutures need to be removed. Staples When surgical staples are used to close a  wound, the edges of your skin on both sides of the wound are brought close together. A staple is placed across the wound, and an instrument secures the edges together. Staples are often used to close surgical cuts (incisions). Staples are faster to use than sutures, and they cause less skin reaction. Staples need to be removed using a tool that bends the staples away from your skin. HOW DO I CARE FOR MY WOUND CLOSURE?  Take medicines only as directed by your health care provider.  If you were  prescribed an antibiotic medicine for your wound, finish it all even if you start to feel better.  Use ointments or creams only as directed by your health care provider.  Wash your hands with soap and water before and after touching your wound.  Do not soak your wound in water. Do not take baths, swim, or use a hot tub until your health care provider approves.  Ask your health care provider when you can start showering. Cover your wound if directed by your health care provider.  Do not take out your own sutures or staples.  Do not pick at your wound. Picking can cause an infection.  Keep all follow-up visits as directed by your health care provider. This is important. HOW LONG WILL I HAVE MY WOUND CLOSURE?  Leave adhesive glue on your skin until the glue peels away.  Leave adhesive strips on your skin until the strips fall off.  Absorbable sutures will dissolve within several days.  Nonabsorbable sutures and staples must be removed. The location of the wound will determine how long they stay in. This can range from several days to a couple of weeks. WHEN SHOULD I SEEK HELP FOR MY WOUND CLOSURE? Contact your health care provider if:  You have a fever.  You have chills.  You have drainage, redness, swelling, or pain at your wound.  There is a bad smell coming from your wound.  The skin edges of your wound start to separate after your sutures have been removed.  Your wound becomes thick, raised, and darker in color after your sutures come out (scarring).   This information is not intended to replace advice given to you by your health care provider. Make sure you discuss any questions you have with your health care provider.   Document Released: 12/19/2000 Document Revised: 04/16/2014 Document Reviewed: 09/02/2013 Elsevier Interactive Patient Education [employer]2016 [employer]

## 2015-04-24 NOTE — ED Provider Notes (Signed)
CSN: [id number]     Arrival date & time 04/24/15  1512 History  By signing my name below, I, [name], attest that this documentation has been prepared under the direction and in the presence of Teresa Quale, PA-C. Electronically Signed: Elon Hicks ED Scribe. 04/24/2015. 3:24 PM.    Chief Complaint  Patient presents with  . Toe Pain   The history is provided by the patient. No language interpreter was used.   HPI Comments: [name] is a 30 y.o. female who presents to the Emergency Department complaining of left great toe injury that occurred 3:00 am yesterday.  The patient reports she stubbed her toe against a screw, creating a small wound and constant, moderate pani.  She denies hx of DM, immunologic compromise.  Last tetanus unknown.       Past Medical History  Diagnosis Date  . No pertinent past medical history    Past Surgical History  Procedure Laterality Date  . Foot surgery  as child    arch corrections  . Cesarean section N/A 05/19/2012    Procedure: Primary Cesarean Section;  Surgeon: Lavina Hamman, MD;  Location: [facility];  Service: Obstetrics;  Laterality: N/A;  Primary Cesarean Section   History reviewed. No pertinent family history. Social History  Substance Use Topics  . Smoking status: Never Smoker   . Smokeless tobacco: Never Used  . Alcohol Use: No   OB History    Gravida Para Term Preterm AB TAB SAB Ectopic Multiple Living   3 3 3  0 0 0 0 0 0 2     Review of Systems 10 Systems reviewed and all are negative for acute change except as noted in the HPI.  Allergies  Bee venom; Mango flavor; Peanut-containing drug products; and Penicillins  Home Medications   Prior to Admission medications   Medication Sig Start Date End Date Taking? Authorizing Provider  HYDROcodone-acetaminophen (NORCO/VICODIN) 5-325 MG per tablet Take 1 tablet by mouth every 4 (four) hours as needed for severe pain. 04/17/14   Hope Orlene Och, NP  levonorgestrel (MIRENA) 20  MCG/24HR IUD 1 each by Intrauterine route once.    Historical Provider, MD  naproxen (NAPROSYN) 500 MG tablet Take 1 tablet (500 mg total) by mouth 2 (two) times daily. 04/17/14   Hope Orlene Och, NP   BP 119/74 mmHg  Pulse 91  Temp(Src) 98.2 F (36.8 C) (Temporal)  Resp 16  Ht 5\' 4"  (1.626 m)  Wt 170 lb (77.111 kg)  BMI 29.17 kg/m2  SpO2 99%  LMP 04/10/2015 Physical Exam  Constitutional: She is oriented to person, place, and time. She appears well-developed and well-nourished. No distress.  HENT:  Head: Normocephalic and atraumatic.  Eyes: Conjunctivae and EOM are normal.  Neck: Neck supple. No tracheal deviation present.  Cardiovascular: Normal rate.   Pulmonary/Chest: Effort normal. No respiratory distress.  Musculoskeletal: Normal range of motion.  DP/PT pulse on left 2+.  Cap refill < 2 seconds.  No lesions between the toes.  0.7 cm flap type laceration of the distal aspect of the 5th toe plantar surface extending to the nail.  Nail is intact.  FROM of all toes.  Callous the metatarsal heads but no deformity.  Achilles tendon is intact.  FROM of left knee and hip.   Neurological: She is alert and oriented to person, place, and time.  Skin: Skin is warm and dry.  Psychiatric: She has a normal mood and affect. Her behavior is normal.  Nursing note  and vitals reviewed.   ED Course  .Marland Kitchen.Laceration Repair Date/Time: 04/24/2015 5:02 PM Performed by: Teresa QualeBRYANT, Adylynn Hertenstein Authorized by: Teresa QualeBRYANT, Jaylynne Birkhead Consent: Verbal consent obtained. Risks and benefits: risks, benefits and alternatives were discussed Consent given by: patient Patient understanding: patient states understanding of the procedure being performed Patient identity confirmed: arm band Time out: Immediately prior to procedure a "time out" was called to verify the correct patient, procedure, equipment, support staff and site/side marked as required. Body area: lower extremity Location details: left little toe Laceration length: 0.7  cm Foreign bodies: no foreign bodies Tendon involvement: none Patient sedated: no Preparation: Patient was prepped and draped in the usual sterile fashion. Irrigation solution: tap water Amount of cleaning: standard Skin closure: Steri-Strips Approximation: loose Approximation difficulty: simple Patient tolerance: Patient tolerated the procedure well with no immediate complications Comments: Due to the age of the cut, the edges were loosely brought together with steri-strips. Pt tolerated the precedure without problem.   (including critical care time)  DIAGNOSTIC STUDIES: Oxygen Saturation is 99% on RA, normal by my interpretation.    COORDINATION OF CARE:  4:20 PM Discussed plan to steri-strip complaint.  Patient should keep wound clean with soap and water.  Will order tetanus.  Return precautions advised including red-streaking or purulent drainage.  Patient acknowledges and agrees with plan.    Labs Review Labs Reviewed - No data to display  Imaging Review No results found. I have personally reviewed and evaluated these images and lab results as part of my medical decision-making.   EKG Interpretation None      MDM  Vital signs stable. No gross neurovascular injury involving the toe or foot. Wound repaired with steri-strip. Discussed need to return if signs of infection. Also discussed need to present lacerations in a timely manner.   Final diagnoses:  None    *I have reviewed nursing notes, vital signs, and all appropriate lab and imaging results for this patient.**  **I personally performed the services described in this documentation, which was scribed in my presence. The recorded information has been reviewed and is accurate.[name]*   Hser Belanger, PA-C 04/24/15 1707  Samuel JesterKathleen McManus, DO 04/27/15 1710

## 2015-04-24 NOTE — ED Notes (Signed)
Patient c/o left little toe pain. Per patient hit toe on a screw that was sticking out from bed post. Patient doe have bandaid on toe.

## 2016-09-19 ENCOUNTER — Emergency Department (HOSPITAL_COMMUNITY)
Admission: EM | Admit: 2016-09-19 | Discharge: 2016-09-19 | Disposition: A | Discharge status: Home Health | Payer: Self-pay | Attending: Emergency Medicine | Admitting: Emergency Medicine

## 2016-09-19 ENCOUNTER — Encounter (HOSPITAL_COMMUNITY): Payer: Self-pay | Admitting: *Deleted

## 2016-09-19 DIAGNOSIS — Y929 Unspecified place or not applicable: Secondary | ICD-10-CM | POA: Insufficient documentation

## 2016-09-19 DIAGNOSIS — T23061A Burn of unspecified degree of back of right hand, initial encounter: Secondary | ICD-10-CM | POA: Insufficient documentation

## 2016-09-19 DIAGNOSIS — Y9389 Activity, other specified: Secondary | ICD-10-CM | POA: Insufficient documentation

## 2016-09-19 DIAGNOSIS — Y998 Other external cause status: Secondary | ICD-10-CM | POA: Insufficient documentation

## 2016-09-19 DIAGNOSIS — X19XXXA Contact with other heat and hot substances, initial encounter: Secondary | ICD-10-CM | POA: Insufficient documentation

## 2016-09-19 DIAGNOSIS — Z91010 Allergy to peanuts: Secondary | ICD-10-CM | POA: Insufficient documentation

## 2016-09-19 NOTE — Discharge Instructions (Signed)
Please use the splint to your hand daily until the wound has healed completely. Please cleanse the area daily with soap and water and dried carefully. Please see your primary physician or return to the emergency department if any signs of infection.

## 2016-09-19 NOTE — ED Triage Notes (Signed)
Pt ws at work when a cooking plate came down on her right hand. This occurred last Wednesday. Pt comes in because this area is painful and it continues to split when using her hand.

## 2016-09-19 NOTE — ED Provider Notes (Signed)
[facility]PT Provider Note   CSN: [id number] Arrival date & time: 09/19/16  1510     History   Chief Complaint Chief Complaint  Patient presents with  . Hand Injury    HPI [name] is a 31 y.o. female.  Patient is a 31 year old female who presents to the emergency department with a complaint of a cut that keeps opening.  The patient states that approximately 7 days ago she sustained a burn from a cooking plate to her right hand. She states the area has been healing, but when she bends her fingers or attempts to grip something tightly it opens up. The patient works around food. And when the wound opens and drains or bleeds and the patient states she has to have something done about it or otherwise she cannot go back to work. She has not had any high fever. There's been no pus like drainage noted.   The history is provided by the patient.  Hand Injury      Past Medical History:  Diagnosis Date  . No pertinent past medical history     Patient Active Problem List   Diagnosis Date Noted  . S/P cesarean section 05/19/2012    Past Surgical History:  Procedure Laterality Date  . CESAREAN SECTION N/A 05/19/2012   Procedure: Primary Cesarean Section;  Surgeon: Lavina Hammanodd Meisinger, MD;  Location: [facility];  Service: Obstetrics;  Laterality: N/A;  Primary Cesarean Section  . FOOT SURGERY  as child   arch corrections    OB History    Gravida Para Term Preterm AB Living   3 3 3  0 0 2   SAB TAB Ectopic Multiple Live Births   0 0 0 0         Home Medications    Prior to Admission medications   Not on File    Family History No family history on file.  Social History Social History  Substance Use Topics  . Smoking status: Never Smoker  . Smokeless tobacco: Never Used  . Alcohol use No     Allergies   Bee venom; Mango flavor; Peanut-containing drug products; and Penicillins   Review of Systems Review of Systems  Constitutional: Negative for activity  change and appetite change.  HENT: Negative for congestion, ear discharge, ear pain, facial swelling, nosebleeds, rhinorrhea, sneezing and tinnitus.   Eyes: Negative for photophobia, pain and discharge.  Respiratory: Negative for cough, choking, shortness of breath and wheezing.   Cardiovascular: Negative for chest pain, palpitations and leg swelling.  Gastrointestinal: Negative for abdominal pain, blood in stool, constipation, diarrhea, nausea and vomiting.  Genitourinary: Negative for difficulty urinating, dysuria, flank pain, frequency and hematuria.  Musculoskeletal: Negative for back pain, gait problem, myalgias and neck pain.  Skin: Positive for wound. Negative for color change and rash.       Hand wound  Neurological: Negative for dizziness, seizures, syncope, facial asymmetry, speech difficulty, weakness and numbness.  Hematological: Negative for adenopathy. Does not bruise/bleed easily.  Psychiatric/Behavioral: Negative for agitation, confusion, hallucinations, self-injury and suicidal ideas. The patient is not nervous/anxious.      Physical Exam Updated Vital Signs BP 136/70 (BP Location: Left Arm)   Pulse 79   Temp 98.9 F (37.2 C) (Oral)   Resp 18   Ht 5\' 4"  (1.626 m)   Wt 77.1 kg (170 lb)   LMP 08/30/2016   SpO2 97%   BMI 29.18 kg/m   Physical Exam  Musculoskeletal:  Right hand: Normal sensation noted. Normal strength noted.       Hands:    ED Treatments / Results  Labs (all labs ordered are listed, but only abnormal results are displayed) Labs Reviewed - No data to display  EKG  EKG Interpretation None       Radiology No results found.  Procedures .Splint Application Date/Time: 09/19/2016 4:44 PM Performed by: [name] Authorized by: [name]   Consent:    Consent obtained:  Verbal   Consent given by:  Patient Pre-procedure details:    Sensation:  Normal Procedure details:    Laterality:  Right   Location:  Hand   Hand:   R hand   Splint type:  Finger (buddy tape)   Supplies:  Cotton padding and elastic bandage (padded tongue depressor) Post-procedure details:    Pain:  Improved   Sensation:  Normal   Skin color:  No bleeding or drainage from the burn area   Patient tolerance of procedure:  Tolerated well, no immediate complications   (including critical care time)  Medications Ordered in ED Medications - No data to display   Initial Impression / Assessment and Plan / ED Course  I have reviewed the triage vital signs and the nursing notes.  Pertinent labs & imaging results that were available during my care of the patient were reviewed by me and considered in my medical decision making (see chart for details).       Final Clinical Impressions(s) / ED Diagnoses MDM The patient has a wound from a second-degree burn that is slow healing. It pops open whenever she makes a fist or flexes her fingers. The index finger and long finger were buddy taped and the splint was applied to prevent flexion at the MP joint area. Patient tolerated the procedure without problem. I've advised the patient that this will probably take another week to completely heal. The patient is advised to use caution while she is at work since her finger is taped together. The patient he knowledge is understanding of these instructions.    Final diagnoses:  Burn of back of right hand, unspecified burn degree, initial encounter    New Prescriptions New Prescriptions   No medications on file     [name], Cordelia Poche 09/19/16 1648    Bethann Berkshire, MD 09/19/16 2322

## 2016-09-28 ENCOUNTER — Emergency Department (HOSPITAL_COMMUNITY)
Admission: EM | Admit: 2016-09-28 | Discharge: 2016-09-28 | Disposition: A | Discharge status: Home / Self Care | Payer: Self-pay | Attending: Emergency Medicine | Admitting: Emergency Medicine

## 2016-09-28 ENCOUNTER — Encounter (HOSPITAL_COMMUNITY): Payer: Self-pay

## 2016-09-28 DIAGNOSIS — L299 Pruritus, unspecified: Secondary | ICD-10-CM | POA: Insufficient documentation

## 2016-09-28 DIAGNOSIS — Z91010 Allergy to peanuts: Secondary | ICD-10-CM | POA: Insufficient documentation

## 2016-09-28 DIAGNOSIS — R21 Rash and other nonspecific skin eruption: Secondary | ICD-10-CM | POA: Insufficient documentation

## 2016-09-28 MED ORDER — PREDNISONE 20 MG PO TABS: ORAL_TABLET | ORAL | 0 refills | Status: DC

## 2016-09-28 MED ORDER — METHYLPREDNISOLONE SODIUM SUCC 125 MG IJ SOLR
125.0000 mg | Freq: Once | INTRAMUSCULAR | Status: AC
  Administered 2016-09-28: 125 mg via INTRAVENOUS
  Filled 2016-09-28: qty 2

## 2016-09-28 MED ORDER — FAMOTIDINE 20 MG PO TABS: 20.0000 mg | ORAL_TABLET | Freq: Two times a day (BID) | ORAL | 0 refills | Status: DC

## 2016-09-28 MED ORDER — DIPHENHYDRAMINE HCL 50 MG/ML IJ SOLN
50.0000 mg | Freq: Once | INTRAMUSCULAR | Status: AC
  Administered 2016-09-28: 50 mg via INTRAVENOUS
  Filled 2016-09-28: qty 1

## 2016-09-28 MED ORDER — FAMOTIDINE IN NACL 20-0.9 MG/50ML-% IV SOLN
20.0000 mg | Freq: Once | INTRAVENOUS | Status: AC
  Administered 2016-09-28: 20 mg via INTRAVENOUS
  Filled 2016-09-28: qty 50

## 2016-09-28 NOTE — ED Triage Notes (Signed)
Itching rash on her back, no known allergens, not taking any meds for same.

## 2016-09-28 NOTE — Discharge Instructions (Signed)
Stay cool, heat will make the rash itch more. Take the medications as prescribed. You can also take benadryl 25-50 mg OTC every 6 hrs as needed for itching or rash.  Recheck if you have difficulty breathing, swallowing or the rash continues to spread.

## 2016-09-28 NOTE — ED Provider Notes (Signed)
[facility]-EMERG[facility]Y DEPT Provider Note   CSN: [id number] Arrival date & time: 09/28/16  0355  Time seen 04:50 AM   History   Chief Complaint Chief Complaint  Patient presents with  . Rash    HPI [name] is a 31 y.o. female.  HPI  patient states about 10 PM she had some itching on her back and she felt a few bumps on her back. About 1:30 AM she noted there were more bumps on her back. She states she has had food allergies in the past however she normally has a more immediate reaction. She's had reactions to mangoes, melons, and nuts in the past with rashes on her body. She states at 5:30 PM she ate a new hamburger at her place of employment that had a sweet mustard sauce and bacon onion sauce that she's never had before. Otherwise she states there's been nothing new that she's been exposed to. She denies any difficulty breathing, swallowing, or swelling of her lips, she denies rash anywhere on her body other than her back.  PCP none  Past Medical History:  Diagnosis Date  . No pertinent past medical history     Patient Active Problem List   Diagnosis Date Noted  . S/P cesarean section 05/19/2012    Past Surgical History:  Procedure Laterality Date  . CESAREAN SECTION N/A 05/19/2012   Procedure: Primary Cesarean Section;  Surgeon: Lavina Hamman, MD;  Location: [facility];  Service: Obstetrics;  Laterality: N/A;  Primary Cesarean Section  . FOOT SURGERY  as child   arch corrections    OB History    Gravida Para Term Preterm AB Living   3 3 3  0 0 2   SAB TAB Ectopic Multiple Live Births   0 0 0 0         Home Medications    Prior to Admission medications   Medication Sig Start Date End Date Taking? Authorizing Provider  famotidine (PEPCID) 20 MG tablet Take 1 tablet (20 mg total) by mouth 2 (two) times daily. 09/28/16   Devoria Albe, MD  predniSONE (DELTASONE) 20 MG tablet Take 3 po QD x 3d , then 2 po QD x 3d then 1 po QD x 3d 09/28/16   Devoria Albe, MD    Family  History No family history on file.  Social History Social History  Substance Use Topics  . Smoking status: Never Smoker  . Smokeless tobacco: Never Used  . Alcohol use No  employed   Allergies   Bee venom; Mango flavor; Peanut-containing drug products; and Penicillins   Review of Systems Review of Systems  All other systems reviewed and are negative.    Physical Exam Updated Vital Signs BP 114/77 (BP Location: Left Arm)   Pulse 83   Temp 98.5 F (36.9 C) (Oral)   Resp 16   Ht 5\' 4"  (1.626 m)   Wt 77.1 kg (170 lb)   LMP 09/26/2016   SpO2 100%   BMI 29.18 kg/m   Vital signs normal    Physical Exam  Constitutional: She is oriented to person, place, and time. She appears well-developed and well-nourished.  Non-toxic appearance. She does not appear ill. No distress.  HENT:  Head: Normocephalic and atraumatic.  Right Ear: External ear normal.  Left Ear: External ear normal.  Nose: Nose normal. No mucosal edema or rhinorrhea.  Mouth/Throat: Oropharynx is clear and moist and mucous membranes are normal. No dental abscesses or uvula swelling.  Eyes: Conjunctivae and  EOM are normal. Pupils are equal, round, and reactive to light.  Neck: Normal range of motion and full passive range of motion without pain. Neck supple.  Cardiovascular: Normal rate, regular rhythm and normal heart sounds.  Exam reveals no gallop and no friction rub.   No murmur heard. Pulmonary/Chest: Effort normal and breath sounds normal. No respiratory distress. She has no wheezes. She has no rhonchi. She has no rales. She exhibits no tenderness and no crepitus.  Abdominal: Soft. Normal appearance and bowel sounds are normal. She exhibits no distension. There is no tenderness. There is no rebound and no guarding.  Musculoskeletal: Normal range of motion. She exhibits no edema or tenderness.  Moves all extremities well.   Neurological: She is alert and oriented to person, place, and time. She has normal  strength. No cranial nerve deficit.  Skin: Skin is warm, dry and intact. Rash noted. No erythema. No pallor.  Patient has since scattered small hyperpigmented lesions on her back that she states is old. She is noted to have a few scattered small pustules on her back, the area up close to her right shoulder blade has some diffuse redness of the skin. She does not have rash anywhere else on her extremities or trunk or face.  Psychiatric: She has a normal mood and affect. Her speech is normal and behavior is normal. Her mood appears not anxious.  Nursing note and vitals reviewed.    ED Treatments / Results  Labs (all labs ordered are listed, but only abnormal results are displayed) Labs Reviewed - No data to display  EKG  EKG Interpretation None       Radiology No results found.  Procedures Procedures (including critical care time)  Medications Ordered in ED Medications  diphenhydrAMINE (BENADRYL) injection 50 mg (50 mg Intravenous Given 09/28/16 0519)  methylPREDNISolone sodium succinate (SOLU-MEDROL) 125 mg/2 mL injection 125 mg (125 mg Intravenous Given 09/28/16 0518)  famotidine (PEPCID) IVPB 20 mg premix (0 mg Intravenous Stopped 09/28/16 0553)     Initial Impression / Assessment and Plan / ED Course  I have reviewed the triage vital signs and the nursing notes.  Pertinent labs & imaging results that were available during my care of the patient were reviewed by me and considered in my medical decision making (see chart for details).  Patient did not drive to the ED. She was given the choice to take oral, IM or IV medications, she chose IV.She was given IV Benadryl, slight Medrol, and Pepcid.  Recheck at 5:50 AM patient states her itching is better, when I examine her back the diffuse redness of her upper back is gone. She still has the small pustular rash. The pustules are less than a millimeter in size.  Final Clinical Impressions(s) / ED Diagnoses   Final diagnoses:    Rash  Pruritus    New Prescriptions New Prescriptions   FAMOTIDINE (PEPCID) 20 MG TABLET    Take 1 tablet (20 mg total) by mouth 2 (two) times daily.   PREDNISONE (DELTASONE) 20 MG TABLET    Take 3 po QD x 3d , then 2 po QD x 3d then 1 po QD x 3d  OTC benadryl  Plan discharge   Devoria AlbeIva Jalaina Salyers, MD, Concha PyoFACEP    Teng Decou, MD 09/28/16 [phone]

## 2017-02-08 ENCOUNTER — Emergency Department (HOSPITAL_COMMUNITY)
Admission: EM | Admit: 2017-02-08 | Discharge: 2017-02-08 | Disposition: A | Discharge status: Home / Self Care | Payer: Self-pay | Attending: Emergency Medicine | Admitting: Emergency Medicine

## 2017-02-08 ENCOUNTER — Encounter (HOSPITAL_COMMUNITY): Payer: Self-pay

## 2017-02-08 DIAGNOSIS — Z91010 Allergy to peanuts: Secondary | ICD-10-CM | POA: Insufficient documentation

## 2017-02-08 DIAGNOSIS — Z79899 Other long term (current) drug therapy: Secondary | ICD-10-CM | POA: Insufficient documentation

## 2017-02-08 DIAGNOSIS — Z880 Allergy status to penicillin: Secondary | ICD-10-CM | POA: Insufficient documentation

## 2017-02-08 DIAGNOSIS — N3000 Acute cystitis without hematuria: Secondary | ICD-10-CM

## 2017-02-08 DIAGNOSIS — Z91030 Bee allergy status: Secondary | ICD-10-CM | POA: Insufficient documentation

## 2017-02-08 LAB — URINALYSIS, ROUTINE W REFLEX MICROSCOPIC
Bilirubin Urine: NEGATIVE
Glucose, UA: NEGATIVE mg/dL
KETONES UR: NEGATIVE mg/dL
NITRITE: NEGATIVE
PH: 5 (ref 5.0–8.0)
Protein, ur: 100 mg/dL — AB
SPECIFIC GRAVITY, URINE: 1.031 — AB (ref 1.005–1.030)
Trans Epithel, UA: 2

## 2017-02-08 LAB — POC URINE PREG, ED: Preg Test, Ur: NEGATIVE

## 2017-02-08 MED ORDER — CEPHALEXIN 500 MG PO CAPS: 500.0000 mg | ORAL_CAPSULE | Freq: Two times a day (BID) | ORAL | 0 refills | Status: DC

## 2017-02-08 MED ORDER — PHENAZOPYRIDINE HCL 200 MG PO TABS: 200.0000 mg | ORAL_TABLET | Freq: Three times a day (TID) | ORAL | 0 refills | Status: DC | PRN

## 2017-02-08 MED ORDER — PHENAZOPYRIDINE HCL 100 MG PO TABS
200.0000 mg | ORAL_TABLET | Freq: Once | ORAL | Status: AC
  Administered 2017-02-08: 200 mg via ORAL
  Filled 2017-02-08: qty 2

## 2017-02-08 MED ORDER — CEPHALEXIN 500 MG PO CAPS
500.0000 mg | ORAL_CAPSULE | Freq: Once | ORAL | Status: AC
  Administered 2017-02-08: 500 mg via ORAL
  Filled 2017-02-08: qty 1

## 2017-02-08 NOTE — ED Provider Notes (Signed)
[facility] EMERGENCY DEPARTMENT Provider Note   CSN: [id number] Arrival date & time: 02/08/17  0105     History   Chief Complaint Chief Complaint  Patient presents with  . Dysuria    HPI [name] is a 31 y.o. female.  Patient presents to the emergency department for evaluation of dysuria.  Patient reports that she urinated while at work and felt a burning during the urine stream and then pain after she finished urinating.  She has not had any urinary frequency or urgency.  There is no fever, nausea, vomiting, back pain.  She has not had any continuous abdominal pain.      Past Medical History:  Diagnosis Date  . No pertinent past medical history     Patient Active Problem List   Diagnosis Date Noted  . S/P cesarean section 05/19/2012    Past Surgical History:  Procedure Laterality Date  . CESAREAN SECTION N/A 05/19/2012   Procedure: Primary Cesarean Section;  Surgeon: Lavina Hamman, MD;  Location: [facility];  Service: Obstetrics;  Laterality: N/A;  Primary Cesarean Section  . FOOT SURGERY  as child   arch corrections    OB History    Gravida Para Term Preterm AB Living   3 3 3  0 0 2   SAB TAB Ectopic Multiple Live Births   0 0 0 0         Home Medications    Prior to Admission medications   Medication Sig Start Date End Date Taking? Authorizing Provider  cephALEXin (KEFLEX) 500 MG capsule Take 1 capsule (500 mg total) by mouth 2 (two) times daily. 02/08/17   Gilda Crease, MD  famotidine (PEPCID) 20 MG tablet Take 1 tablet (20 mg total) by mouth 2 (two) times daily. 09/28/16   Devoria Albe, MD  phenazopyridine (PYRIDIUM) 200 MG tablet Take 1 tablet (200 mg total) by mouth 3 (three) times daily as needed for pain. 02/08/17   Gilda Crease, MD  predniSONE (DELTASONE) 20 MG tablet Take 3 po QD x 3d , then 2 po QD x 3d then 1 po QD x 3d 09/28/16   Devoria Albe, MD    Family History No family history on file.  Social History Social History    Substance Use Topics  . Smoking status: Never Smoker  . Smokeless tobacco: Never Used  . Alcohol use No     Allergies   Bee venom; Mango flavor; Peanut-containing drug products; and Penicillins   Review of Systems Review of Systems  Genitourinary: Positive for dysuria.  All other systems reviewed and are negative.    Physical Exam Updated Vital Signs BP 114/63 (BP Location: Right Arm)   Pulse 86   Temp 98.9 F (37.2 C) (Oral)   Resp 15   Ht 5\' 4"  (1.626 m)   Wt 79.4 kg (175 lb)   LMP 02/07/2017   SpO2 100%   BMI 30.04 kg/m   Physical Exam  Constitutional: She is oriented to person, place, and time. She appears well-developed and well-nourished. No distress.  HENT:  Head: Normocephalic and atraumatic.  Right Ear: Hearing normal.  Left Ear: Hearing normal.  Nose: Nose normal.  Mouth/Throat: Oropharynx is clear and moist and mucous membranes are normal.  Eyes: Pupils are equal, round, and reactive to light. Conjunctivae and EOM are normal.  Neck: Normal range of motion. Neck supple.  Cardiovascular: Regular rhythm, S1 normal and S2 normal.  Exam reveals no gallop and no friction rub.  No murmur heard. Pulmonary/Chest: Effort normal and breath sounds normal. No respiratory distress. She exhibits no tenderness.  Abdominal: Soft. Normal appearance and bowel sounds are normal. There is no hepatosplenomegaly. There is no tenderness. There is no rebound, no guarding, no tenderness at McBurney's point and negative Murphy's sign. No hernia.  Musculoskeletal: Normal range of motion.  Neurological: She is alert and oriented to person, place, and time. She has normal strength. No cranial nerve deficit or sensory deficit. Coordination normal. GCS eye subscore is 4. GCS verbal subscore is 5. GCS motor subscore is 6.  Skin: Skin is warm, dry and intact. No rash noted. No cyanosis.  Psychiatric: She has a normal mood and affect. Her speech is normal and behavior is normal. Thought  content normal.  Nursing note and vitals reviewed.    ED Treatments / Results  Labs (all labs ordered are listed, but only abnormal results are displayed) Labs Reviewed  URINALYSIS, ROUTINE W REFLEX MICROSCOPIC - Abnormal; Notable for the following:       Result Value   APPearance CLOUDY (*)    Specific Gravity, Urine 1.031 (*)    Hgb urine dipstick SMALL (*)    Protein, ur 100 (*)    Leukocytes, UA LARGE (*)    Bacteria, UA RARE (*)    Squamous Epithelial / LPF 6-30 (*)    All other components within normal limits  POC URINE PREG, ED    EKG  EKG Interpretation None       Radiology No results found.  Procedures Procedures (including critical care time)  Medications Ordered in ED Medications  phenazopyridine (PYRIDIUM) tablet 200 mg (not administered)  cephALEXin (KEFLEX) capsule 500 mg (not administered)     Initial Impression / Assessment and Plan / ED Course  I have reviewed the triage vital signs and the nursing notes.  Pertinent labs & imaging results that were available during my care of the patient were reviewed by me and considered in my medical decision making (see chart for details).     Patient presents to the emergency department for evaluation of dysuria.  Symptoms began tonight.  She appears well.  Vital signs are stable.  She is not febrile.  Examination was unremarkable.  Urinalysis does show obvious infection.  Will treat with Keflex and Pyridium.  Final Clinical Impressions(s) / ED Diagnoses   Final diagnoses:  Acute cystitis without hematuria    New Prescriptions New Prescriptions   CEPHALEXIN (KEFLEX) 500 MG CAPSULE    Take 1 capsule (500 mg total) by mouth 2 (two) times daily.   PHENAZOPYRIDINE (PYRIDIUM) 200 MG TABLET    Take 1 tablet (200 mg total) by mouth 3 (three) times daily as needed for pain.     Gilda CreasePollina, Donn Zanetti J, MD 02/08/17 [phone]

## 2017-02-08 NOTE — ED Triage Notes (Signed)
Pt reports onset of burning with urination x 1 tonight. Pt started her menstrual cycle yesterday

## 2017-02-10 ENCOUNTER — Encounter (HOSPITAL_COMMUNITY): Payer: Self-pay | Admitting: *Deleted

## 2017-02-10 ENCOUNTER — Emergency Department (HOSPITAL_COMMUNITY)
Admission: EM | Admit: 2017-02-10 | Discharge: 2017-02-10 | Disposition: A | Discharge status: Home / Self Care | Payer: Self-pay | Attending: Emergency Medicine | Admitting: Emergency Medicine

## 2017-02-10 ENCOUNTER — Other Ambulatory Visit: Payer: Self-pay

## 2017-02-10 DIAGNOSIS — Z91010 Allergy to peanuts: Secondary | ICD-10-CM | POA: Insufficient documentation

## 2017-02-10 DIAGNOSIS — Z79899 Other long term (current) drug therapy: Secondary | ICD-10-CM | POA: Insufficient documentation

## 2017-02-10 DIAGNOSIS — N9089 Other specified noninflammatory disorders of vulva and perineum: Secondary | ICD-10-CM

## 2017-02-10 DIAGNOSIS — N766 Ulceration of vulva: Secondary | ICD-10-CM | POA: Insufficient documentation

## 2017-02-10 DIAGNOSIS — B9689 Other specified bacterial agents as the cause of diseases classified elsewhere: Secondary | ICD-10-CM | POA: Insufficient documentation

## 2017-02-10 DIAGNOSIS — N760 Acute vaginitis: Secondary | ICD-10-CM | POA: Insufficient documentation

## 2017-02-10 LAB — WET PREP, GENITAL
SPERM: NONE SEEN
Trich, Wet Prep: NONE SEEN
YEAST WET PREP: NONE SEEN

## 2017-02-10 LAB — POC URINE PREG, ED: PREG TEST UR: NEGATIVE

## 2017-02-10 MED ORDER — AZITHROMYCIN 250 MG PO TABS
1000.0000 mg | ORAL_TABLET | Freq: Once | ORAL | Status: AC
  Administered 2017-02-10: 1000 mg via ORAL
  Filled 2017-02-10: qty 4

## 2017-02-10 MED ORDER — METRONIDAZOLE 500 MG PO TABS: 500.0000 mg | ORAL_TABLET | Freq: Two times a day (BID) | ORAL | 0 refills | Status: DC

## 2017-02-10 MED ORDER — VALACYCLOVIR HCL 1 G PO TABS: 1000.0000 mg | ORAL_TABLET | Freq: Two times a day (BID) | ORAL | 0 refills | Status: AC

## 2017-02-10 MED ORDER — CEFTRIAXONE SODIUM 250 MG IJ SOLR
250.0000 mg | Freq: Once | INTRAMUSCULAR | Status: AC
  Administered 2017-02-10: 250 mg via INTRAMUSCULAR
  Filled 2017-02-10: qty 250

## 2017-02-10 MED ORDER — LIDOCAINE HCL (PF) 1 % IJ SOLN
INTRAMUSCULAR | Status: AC
  Administered 2017-02-10: 1 mL
  Filled 2017-02-10: qty 2

## 2017-02-10 NOTE — ED Triage Notes (Addendum)
Pt with abscess to inner labia noticed since yesterday.  Burning on urination, frequent urination as well.  Pt states she is taking her antibiotic as prescribed - keflex 500mg  bid.  Pt denies fever.

## 2017-02-10 NOTE — Discharge Instructions (Addendum)
You were given medicine for pelvic infections and are being treated for a possible herpes infection.  Please avoid intercourse and take medication as prescribed.  Recheck with your doctor this week for test results.

## 2017-02-10 NOTE — ED Provider Notes (Addendum)
[facility] EMERGENCY DEPARTMENT Provider Note   CSN: [id number] Arrival date & time: 02/10/17  1907     History   Chief Complaint Chief Complaint  Patient presents with  . Abscess  . Urinary Tract Infection    HPI [name] is a 31 y.o. female.  HPI   31 year old female G3P3 LMP 67 using condoms for birth control presents today after recently being seen and treated for UTI with Keflex with complaining of severe pain right labia which she thinks may be an abscess.  Not had similar lower symptoms in the past.  She continues to have burning with urination but it appears to be external.  She has been taking her medications.  She has not having fever, nausea, or vomiting.  She has no symptoms consistent with pregnancy and states that her L and P was normal and a normal time.  Denies any prior history of STDs.  She has been with the same partner for an extended period of time.  Past Medical History:  Diagnosis Date  . No pertinent past medical history     Patient Active Problem List   Diagnosis Date Noted  . S/P cesarean section 05/19/2012    Past Surgical History:  Procedure Laterality Date  . FOOT SURGERY  as child   arch corrections    OB History    Gravida Para Term Preterm AB Living   3 3 3  0 0 2   SAB TAB Ectopic Multiple Live Births   0 0 0 0         Home Medications    Prior to Admission medications   Medication Sig Start Date End Date Taking? Authorizing Provider  cephALEXin (KEFLEX) 500 MG capsule Take 1 capsule (500 mg total) by mouth 2 (two) times daily. 02/08/17   Gilda Crease, MD  famotidine (PEPCID) 20 MG tablet Take 1 tablet (20 mg total) by mouth 2 (two) times daily. 09/28/16   Devoria Albe, MD  phenazopyridine (PYRIDIUM) 200 MG tablet Take 1 tablet (200 mg total) by mouth 3 (three) times daily as needed for pain. 02/08/17   Gilda Crease, MD  predniSONE (DELTASONE) 20 MG tablet Take 3 po QD x 3d , then 2 po QD x 3d then 1  po QD x 3d 09/28/16   Devoria Albe, MD    Family History No family history on file.  Social History Social History   Tobacco Use  . Smoking status: Never Smoker  . Smokeless tobacco: Never Used  Substance Use Topics  . Alcohol use: No  . Drug use: No     Allergies   Bee venom; Mango flavor; Peanut-containing drug products; and Penicillins   Review of Systems Review of Systems  All other systems reviewed and are negative.    Physical Exam Updated Vital Signs BP 109/69 (BP Location: Right Arm)   Pulse 83   Temp 98.2 F (36.8 C) (Oral)   Resp 18   Ht 1.626 m (5\' 4" )   Wt 79.4 kg (175 lb)   LMP 02/07/2017   SpO2 100%   BMI 30.04 kg/m   Physical Exam  Constitutional: She appears well-developed.  HENT:  Head: Normocephalic and atraumatic.  Right Ear: External ear normal.  Left Ear: External ear normal.  Eyes: EOM are normal. Pupils are equal, round, and reactive to light.  Neck: Normal range of motion.  Cardiovascular: Normal rate and regular rhythm.  Pulmonary/Chest: Effort normal and breath sounds normal.  Abdominal:  Soft. Bowel sounds are normal.  Genitourinary: Vagina normal and uterus normal.    Cervix exhibits discharge. Cervix exhibits no motion tenderness. Right adnexum displays no mass. Left adnexum displays no mass.  Genitourinary Comments: Tender denuded area approximately 1 x 2 cm  Nursing note and vitals reviewed.    ED Treatments / Results  Labs (all labs ordered are listed, but only abnormal results are displayed) Labs Reviewed  HSV CULTURE AND TYPING  WET PREP, GENITAL  POC URINE PREG, ED  GC/CHLAMYDIA PROBE AMP ([place]    EKG  EKG Interpretation None       Radiology No results found.  Procedures Procedures (including critical care time)  Medications Ordered in ED Medications  cefTRIAXone (ROCEPHIN) injection 250 mg (not administered)  azithromycin (ZITHROMAX) tablet 1,000 mg (not administered)      Initial Impression / Assessment and Plan / ED Course  I have reviewed the triage vital signs and the nursing notes.  Pertinent labs & imaging results that were available during my care of the patient were reviewed by me and considered in my medical decision making (see chart for details).     GC, chlamydia, wet prep, and herpes were sent.  Patient treated here with Rocephin and Zithromax.  Plan valacyclovir and f/u with her gyn  Final Clinical Impressions(s) / ED Diagnoses   Final diagnoses:  Labial lesion    ED Discharge Orders    None       Margarita Grizzleay, Peyten Weare, MD 02/12/17 1045    Margarita Grizzleay, Delno Blaisdell, MD 02/12/17 1046

## 2017-02-10 NOTE — ED Notes (Signed)
POC Urine Pregnancy negative. 

## 2017-02-12 NOTE — ED Notes (Signed)
[facility] stating the G/C swab sent was the incorrect swab and will not be tested. RN notifed Dr. Hyacinth MeekerMiller who stated pt was given full treatment while here and we can notify pt that there was an error in testing but she was treated while here and can follow up with pcp if symptoms persist. RN left non-detailed message for pt to return call.

## 2017-02-13 LAB — HSV CULTURE AND TYPING

## 2017-10-16 ENCOUNTER — Encounter (HOSPITAL_COMMUNITY): Payer: Self-pay | Admitting: *Deleted

## 2017-10-16 ENCOUNTER — Emergency Department (HOSPITAL_COMMUNITY)
Admission: EM | Admit: 2017-10-16 | Discharge: 2017-10-16 | Disposition: A | Discharge status: Home / Self Care | Payer: Self-pay | Attending: Emergency Medicine | Admitting: Emergency Medicine

## 2017-10-16 ENCOUNTER — Other Ambulatory Visit: Payer: Self-pay

## 2017-10-16 DIAGNOSIS — L02412 Cutaneous abscess of left axilla: Secondary | ICD-10-CM | POA: Insufficient documentation

## 2017-10-16 DIAGNOSIS — Z79899 Other long term (current) drug therapy: Secondary | ICD-10-CM | POA: Insufficient documentation

## 2017-10-16 DIAGNOSIS — Z91010 Allergy to peanuts: Secondary | ICD-10-CM | POA: Insufficient documentation

## 2017-10-16 MED ORDER — HYDROCODONE-ACETAMINOPHEN 5-325 MG PO TABS
1.0000 | ORAL_TABLET | ORAL | 0 refills | Status: DC | PRN
Start: 2017-10-16 — End: 2022-09-01

## 2017-10-16 MED ORDER — IBUPROFEN 600 MG PO TABS: 600.0000 mg | ORAL_TABLET | Freq: Four times a day (QID) | ORAL | 0 refills | Status: DC

## 2017-10-16 MED ORDER — HYDROCODONE-ACETAMINOPHEN 5-325 MG PO TABS
2.0000 | ORAL_TABLET | Freq: Once | ORAL | Status: AC
  Administered 2017-10-16: 2 via ORAL
  Filled 2017-10-16: qty 2

## 2017-10-16 MED ORDER — DOXYCYCLINE HYCLATE 100 MG PO TABS
100.0000 mg | ORAL_TABLET | Freq: Once | ORAL | Status: AC
  Administered 2017-10-16: 100 mg via ORAL
  Filled 2017-10-16: qty 1

## 2017-10-16 MED ORDER — DOXYCYCLINE HYCLATE 100 MG PO CAPS: 100.0000 mg | ORAL_CAPSULE | Freq: Two times a day (BID) | ORAL | 0 refills | Status: DC

## 2017-10-16 MED ORDER — IBUPROFEN 800 MG PO TABS
800.0000 mg | ORAL_TABLET | Freq: Once | ORAL | Status: AC
  Administered 2017-10-16: 800 mg via ORAL
  Filled 2017-10-16: qty 1

## 2017-10-16 MED ORDER — LIDOCAINE-EPINEPHRINE (PF) 1 %-1:200000 IJ SOLN
20.0000 mL | Freq: Once | INTRAMUSCULAR | Status: AC
  Administered 2017-10-16: 20 mL
  Filled 2017-10-16: qty 30

## 2017-10-16 MED ORDER — ONDANSETRON HCL 4 MG PO TABS
4.0000 mg | ORAL_TABLET | Freq: Once | ORAL | Status: AC
  Administered 2017-10-16: 4 mg via ORAL
  Filled 2017-10-16: qty 1

## 2017-10-16 NOTE — ED Provider Notes (Signed)
[facility] EMERGENCY DEPARTMENT Provider Note   CSN: [id number] Arrival date & time: 10/16/17  2052     History   Chief Complaint Chief Complaint  Patient presents with  . Abscess    HPI [name] is a 32 y.o. female.  Patient is a 32 year old female who presents to the emergency department with a complaint of abscess of the left underarm.  Patient states this problems been going on for the last 3 or 4 days.  She says she noted a small pimple under the arm, and it is gotten progressively larger and worsening.  She now has pain with movement or with palpation under the left arm area.  There is been no drainage from the site.  The patient has full range of motion of the left arm, but with pain.  No recent fever or chills reported.       Past Medical History:  Diagnosis Date  . No pertinent past medical history     Patient Active Problem List   Diagnosis Date Noted  . S/P cesarean section 05/19/2012    Past Surgical History:  Procedure Laterality Date  . CESAREAN SECTION N/A 05/19/2012   Procedure: Primary Cesarean Section;  Surgeon: Lavina Hamman, MD;  Location: [facility];  Service: Obstetrics;  Laterality: N/A;  Primary Cesarean Section  . FOOT SURGERY  as child   arch corrections     OB History    Gravida  3   Para  3   Term  3   Preterm  0   AB  0   Living  2     SAB  0   TAB  0   Ectopic  0   Multiple  0   Live Births               Home Medications    Prior to Admission medications   Medication Sig Start Date End Date Taking? Authorizing Provider  cephALEXin (KEFLEX) 500 MG capsule Take 1 capsule (500 mg total) by mouth 2 (two) times daily. 02/08/17   Gilda Crease, MD  famotidine (PEPCID) 20 MG tablet Take 1 tablet (20 mg total) by mouth 2 (two) times daily. 09/28/16   Devoria Albe, MD  metroNIDAZOLE (FLAGYL) 500 MG tablet Take 1 tablet (500 mg total) 2 (two) times daily by mouth. 02/10/17   Margarita Grizzle, MD    phenazopyridine (PYRIDIUM) 200 MG tablet Take 1 tablet (200 mg total) by mouth 3 (three) times daily as needed for pain. 02/08/17   Gilda Crease, MD  predniSONE (DELTASONE) 20 MG tablet Take 3 po QD x 3d , then 2 po QD x 3d then 1 po QD x 3d 09/28/16   Devoria Albe, MD    Family History No family history on file.  Social History Social History   Tobacco Use  . Smoking status: Never Smoker  . Smokeless tobacco: Never Used  Substance Use Topics  . Alcohol use: No  . Drug use: No     Allergies   Bee venom; Mango flavor; Peanut-containing drug products; and Penicillins   Review of Systems Review of Systems  Constitutional: Negative for activity change.       All ROS Neg except as noted in HPI  HENT: Negative for nosebleeds.   Eyes: Negative for photophobia and discharge.  Respiratory: Negative for cough, shortness of breath and wheezing.   Cardiovascular: Negative for chest pain and palpitations.  Gastrointestinal: Negative for abdominal pain and blood in  stool.  Genitourinary: Negative for dysuria, frequency and hematuria.  Musculoskeletal: Negative for arthralgias, back pain and neck pain.  Skin: Negative.        Abscess  Neurological: Negative for dizziness, seizures and speech difficulty.  Psychiatric/Behavioral: Negative for confusion and hallucinations.     Physical Exam Updated Vital Signs BP 105/68   Pulse 83   Temp 98.3 F (36.8 C) (Oral)   Resp 16   Ht 5\' 4"  (1.626 m)   Wt 74.4 kg (164 lb 2 oz)   SpO2 100%   BMI 28.17 kg/m   Physical Exam  Constitutional: She is oriented to person, place, and time. She appears well-developed and well-nourished.  Non-toxic appearance.  HENT:  Head: Normocephalic.  Right Ear: Tympanic membrane and external ear normal.  Left Ear: Tympanic membrane and external ear normal.  Eyes: Pupils are equal, round, and reactive to light. EOM and lids are normal.  Neck: Normal range of motion. Neck supple. Carotid bruit is  not present.  Cardiovascular: Normal rate, regular rhythm, normal heart sounds, intact distal pulses and normal pulses.  Pulmonary/Chest: Breath sounds normal. No respiratory distress.  Abdominal: Soft. Bowel sounds are normal. There is no tenderness. There is no guarding.  Musculoskeletal: Normal range of motion.  Lymphadenopathy:       Head (right side): No submandibular adenopathy present.       Head (left side): No submandibular adenopathy present.    She has no cervical adenopathy.  Neurological: She is alert and oriented to person, place, and time. She has normal strength. No cranial nerve deficit or sensory deficit.  Skin: Skin is warm and dry.  Abscess at the left axilla.  No red streaks appreciated.  No drainage appreciated.  The area is tender to touch.  Psychiatric: She has a normal mood and affect. Her speech is normal.  Nursing note and vitals reviewed.    ED Treatments / Results  Labs (all labs ordered are listed, but only abnormal results are displayed) Labs Reviewed  AEROBIC CULTURE (SUPERFICIAL SPECIMEN)    EKG None  Radiology No results found.  Procedures .Marland KitchenIncision and Drainage Date/Time: 10/16/2017 11:05 PM Performed by: [name], PA-C Authorized by: [name], PA-C   Consent:    Consent obtained:  Verbal   Consent given by:  Patient   Risks discussed:  Bleeding, incomplete drainage, pain and infection Universal protocol:    Procedure explained and questions answered to patient or proxy's satisfaction: yes     Immediately prior to procedure a time out was called: yes     Patient identity confirmed:  Arm band Location:    Type:  Abscess   Location:  Upper extremity   Upper extremity location: left axilla. Pre-procedure details:    Skin preparation:  Betadine Anesthesia (see MAR for exact dosages):    Anesthesia method:  Local infiltration   Local anesthetic:  Lidocaine 1% WITH epi Procedure type:    Complexity:  Simple Procedure  details:    Incision types:  Single straight   Incision depth:  Subcutaneous   Scalpel blade:  11   Wound management:  Probed and deloculated and irrigated with saline   Drainage:  Bloody and purulent   Drainage amount:  Moderate   Wound treatment:  Wound left open Post-procedure details:    Patient tolerance of procedure:  Tolerated well, no immediate complications   (including critical care time)  Medications Ordered in ED Medications  doxycycline (VIBRA-TABS) tablet 100 mg (has no administration in  time range)  ibuprofen (ADVIL,MOTRIN) tablet 800 mg (has no administration in time range)  HYDROcodone-acetaminophen (NORCO/VICODIN) 5-325 MG per tablet 2 tablet (has no administration in time range)  ondansetron (ZOFRAN) tablet 4 mg (has no administration in time range)  lidocaine-EPINEPHrine (XYLOCAINE-EPINEPHrine) 1 %-1:200000 (PF) injection 20 mL (has no administration in time range)     Initial Impression / Assessment and Plan / ED Course  I have reviewed the triage vital signs and the nursing notes.  Pertinent labs & imaging results that were available during my care of the patient were reviewed by me and considered in my medical decision making (see chart for details).       Final Clinical Impressions(s) / ED Diagnoses  MDM  Vital signs reviewed.  Pulse oximetry is 100% on room air.  Within normal limits by my interpretation.  Incision and drainage carried out.  Culture sent to the lab.  Patient tolerated the procedure without problem.  Patient will be treated with doxycycline, ibuprofen, and Norco for pain.  The patient will return to the emergency department if any high fever, signs of advancing infection, or changes in patient's general condition.   Final diagnoses:  Abscess of left axilla    ED Discharge Orders        Ordered    HYDROcodone-acetaminophen (NORCO/VICODIN) 5-325 MG tablet  Every 4 hours PRN     10/16/17 2259    doxycycline (VIBRAMYCIN) 100 MG  capsule  2 times daily     10/16/17 2259    ibuprofen (ADVIL,MOTRIN) 600 MG tablet  4 times daily     10/16/17 2259       Ivery QualeBryant, Avyanna Spada, PA-C 10/16/17 2308    Samuel JesterMcManus, Kathleen, DO 10/16/17 2342

## 2017-10-16 NOTE — ED Notes (Signed)
Dressing applied to left axilla, pt tolerated well,

## 2017-10-16 NOTE — Discharge Instructions (Addendum)
A culture from your abscess has been sent to the lab.  Please use warm Epson salt soaks for about 15 minutes daily until your wound heals from the inside out.  Please use doxycycline 2 times daily with food.  Use ibuprofen with breakfast, lunch, dinner, and at bedtime.  May use Norco for more severe pain.This medication may cause drowsiness. Please do not drink, drive, or participate in activity that requires concentration while taking this medication.  Please see your primary physician or return to the emergency department if any high fever, unusual bleeding from your incision site or changes in your general condition.

## 2017-10-16 NOTE — ED Triage Notes (Signed)
Pt c/o "raised area" to left axilla, denies any drainage, states that it appeared a few days ago

## 2017-10-19 LAB — AEROBIC CULTURE W GRAM STAIN (SUPERFICIAL SPECIMEN)

## 2017-10-19 LAB — AEROBIC CULTURE  (SUPERFICIAL SPECIMEN): SPECIAL REQUESTS: NORMAL

## 2018-04-04 ENCOUNTER — Other Ambulatory Visit: Payer: Self-pay

## 2018-04-04 ENCOUNTER — Encounter (HOSPITAL_COMMUNITY): Payer: Self-pay

## 2018-04-04 ENCOUNTER — Emergency Department (HOSPITAL_COMMUNITY)
Admission: EM | Admit: 2018-04-04 | Discharge: 2018-04-04 | Disposition: A | Discharge status: Home / Self Care | Payer: Self-pay | Attending: Emergency Medicine | Admitting: Emergency Medicine

## 2018-04-04 DIAGNOSIS — J029 Acute pharyngitis, unspecified: Secondary | ICD-10-CM | POA: Insufficient documentation

## 2018-04-04 DIAGNOSIS — J0100 Acute maxillary sinusitis, unspecified: Secondary | ICD-10-CM | POA: Insufficient documentation

## 2018-04-04 MED ORDER — AZITHROMYCIN 250 MG PO TABS: ORAL_TABLET | ORAL | 0 refills | Status: DC

## 2018-04-04 MED ORDER — PSEUDOEPHEDRINE HCL ER 120 MG PO TB12: 120.0000 mg | ORAL_TABLET | Freq: Two times a day (BID) | ORAL | 0 refills | Status: DC

## 2018-04-04 MED ORDER — MAGIC MOUTHWASH W/LIDOCAINE: 5.0000 mL | Freq: Three times a day (TID) | ORAL | 0 refills | Status: DC | PRN

## 2018-04-04 NOTE — ED Triage Notes (Signed)
Pt reports sorethroat has 10 days. Coughing, ear pain  And congestion

## 2018-04-04 NOTE — ED Provider Notes (Signed)
[facility] EMERGENCY DEPARTMENT Provider Note   CSN: [id number] Arrival date & time: 04/04/18  1439     History   Chief Complaint Chief Complaint  Patient presents with  . Sore Throat    HPI [name] is a 32 y.o. female.  HPI   [name] is a 32 y.o. female who presents to the Emergency Department complaining of intermittent sore throat for 1 week.  She also complains of cough, runny nose, nasal congestion and ear pain.  She states the sore throat issue improved after several days with initial onset, but returned along with her other symptoms.  She has been taking Benadryl occasionally without relief.  She denies fever, chest pain, shortness of breath, headache or dizziness. She has been drinking fluids without difficulty.   Past Medical History:  Diagnosis Date  . No pertinent past medical history     Patient Active Problem List   Diagnosis Date Noted  . S/P cesarean section 05/19/2012    Past Surgical History:  Procedure Laterality Date  . CESAREAN SECTION N/A 05/19/2012   Procedure: Primary Cesarean Section;  Surgeon: Lavina Hammanodd Meisinger, MD;  Location: [facility];  Service: Obstetrics;  Laterality: N/A;  Primary Cesarean Section  . FOOT SURGERY  as child   arch corrections     OB History    Gravida  3   Para  3   Term  3   Preterm  0   AB  0   Living  2     SAB  0   TAB  0   Ectopic  0   Multiple  0   Live Births               Home Medications    Prior to Admission medications   Medication Sig Start Date End Date Taking? Authorizing Provider  cephALEXin (KEFLEX) 500 MG capsule Take 1 capsule (500 mg total) by mouth 2 (two) times daily. 02/08/17   Gilda CreasePollina, Christopher J, MD  doxycycline (VIBRAMYCIN) 100 MG capsule Take 1 capsule (100 mg total) by mouth 2 (two) times daily. Please take with food 10/16/17   Ivery QualeBryant, Hobson, PA-C  famotidine (PEPCID) 20 MG tablet Take 1 tablet (20 mg total) by mouth 2 (two) times daily. 09/28/16    Devoria AlbeKnapp, Iva, MD  HYDROcodone-acetaminophen (NORCO/VICODIN) 5-325 MG tablet Take 1-2 tablets by mouth every 4 (four) hours as needed. Take with food 10/16/17   Ivery QualeBryant, Hobson, PA-C  ibuprofen (ADVIL,MOTRIN) 600 MG tablet Take 1 tablet (600 mg total) by mouth 4 (four) times daily. 10/16/17   Ivery QualeBryant, Hobson, PA-C  metroNIDAZOLE (FLAGYL) 500 MG tablet Take 1 tablet (500 mg total) 2 (two) times daily by mouth. 02/10/17   Margarita Grizzleay, Danielle, MD  phenazopyridine (PYRIDIUM) 200 MG tablet Take 1 tablet (200 mg total) by mouth 3 (three) times daily as needed for pain. 02/08/17   Gilda CreasePollina, Christopher J, MD  predniSONE (DELTASONE) 20 MG tablet Take 3 po QD x 3d , then 2 po QD x 3d then 1 po QD x 3d 09/28/16   Devoria AlbeKnapp, Iva, MD    Family History No family history on file.  Social History Social History   Tobacco Use  . Smoking status: Never Smoker  . Smokeless tobacco: Never Used  Substance Use Topics  . Alcohol use: No  . Drug use: No     Allergies   Bee venom; Mango flavor; Peanut-containing drug products; and Penicillins   Review of Systems Review of Systems  Constitutional: Negative for activity change, appetite change, chills and fever.  HENT: Positive for congestion, ear pain, rhinorrhea, sneezing and sore throat. Negative for facial swelling, trouble swallowing and voice change.   Eyes: Negative for pain and visual disturbance.  Respiratory: Positive for cough. Negative for shortness of breath.   Cardiovascular: Negative for chest pain.  Gastrointestinal: Negative for abdominal pain, nausea and vomiting.  Musculoskeletal: Negative for arthralgias, neck pain and neck stiffness.  Skin: Negative for color change and rash.  Neurological: Negative for dizziness, facial asymmetry, speech difficulty, weakness, numbness and headaches.  Hematological: Negative for adenopathy.     Physical Exam Updated Vital Signs BP 111/71 (BP Location: Right Arm) Comment: Simultaneous filing. User may not have seen  previous data. Comment (BP Location): Simultaneous filing. User may not have seen previous data.  Pulse 73 Comment: Simultaneous filing. User may not have seen previous data.  Temp 98 F (36.7 C) (Oral) Comment: Simultaneous filing. User may not have seen previous data. Comment (Src): Simultaneous filing. User may not have seen previous data.  Resp 16 Comment: Simultaneous filing. User may not have seen previous data.  Ht 5\' 4"  (1.626 m)   Wt 80.2 kg   LMP 03/31/2018   SpO2 100% Comment: Simultaneous filing. User may not have seen previous data.  BMI 30.36 kg/m   Physical Exam Vitals signs and nursing note reviewed.  Constitutional:      General: She is not in acute distress.    Appearance: Normal appearance. She is not ill-appearing or toxic-appearing.  HENT:     Head: Normocephalic.     Right Ear: Tympanic membrane and ear canal normal.     Left Ear: Tympanic membrane and ear canal normal.     Nose: Congestion present.     Right Turbinates: Swollen. Not enlarged.     Left Turbinates: Swollen. Not enlarged.     Right Sinus: Maxillary sinus tenderness present.     Left Sinus: Maxillary sinus tenderness present.     Mouth/Throat:     Mouth: Mucous membranes are moist.     Pharynx: Oropharynx is clear. Posterior oropharyngeal erythema present. No oropharyngeal exudate.  Neck:     Musculoskeletal: Normal range of motion. No neck rigidity.  Cardiovascular:     Rate and Rhythm: Normal rate and regular rhythm.     Pulses: Normal pulses.  Pulmonary:     Effort: No respiratory distress.     Breath sounds: Normal breath sounds. No stridor. No wheezing.  Musculoskeletal: Normal range of motion.  Lymphadenopathy:     Cervical: No cervical adenopathy.  Skin:    General: Skin is warm.     Findings: No rash.  Neurological:     General: No focal deficit present.     Mental Status: She is alert. Mental status is at baseline.     Sensory: No sensory deficit.     Motor: No weakness.    Psychiatric:        Mood and Affect: Mood normal.      ED Treatments / Results  Labs (all labs ordered are listed, but only abnormal results are displayed) Labs Reviewed - No data to display  EKG None  Radiology No results found.  Procedures Procedures (including critical care time)  Medications Ordered in ED Medications - No data to display   Initial Impression / Assessment and Plan / ED Course  I have reviewed the triage vital signs and the nursing notes.  Pertinent labs & imaging results that  were available during my care of the patient were reviewed by me and considered in my medical decision making (see chart for details).     Patient is well-appearing and nontoxic.  Vital signs are reassuring.  No concerning symptoms for peritonsillar or retropharyngeal abscess.  Airway is patent.  Symptoms are felt to be related to sinusitis and postnasal drip.  Patient agrees to treatment plan and PCP follow-up if needed.  Final Clinical Impressions(s) / ED Diagnoses   Final diagnoses:  Acute pharyngitis, unspecified etiology  Acute maxillary sinusitis, recurrence not specified    ED Discharge Orders    None       Pauline Aus, PA-C 04/04/18 1542    Bethann Berkshire, MD 04/05/18 1333

## 2018-04-04 NOTE — ED Notes (Signed)
Sore throat x 1 week   Here for eval

## 2018-04-04 NOTE — Discharge Instructions (Addendum)
Drink plenty of fluids.  You may take Tylenol or ibuprofen every 4 6 hours if needed for fever and/or body aches.  Take the antibiotic as directed until its finished.  Follow-up with your primary doctor for recheck.

## 2018-04-21 ENCOUNTER — Emergency Department (HOSPITAL_COMMUNITY)
Admission: EM | Admit: 2018-04-21 | Discharge: 2018-04-21 | Disposition: A | Discharge status: Home / Self Care | Payer: Self-pay | Attending: Emergency Medicine | Admitting: Emergency Medicine

## 2018-04-21 ENCOUNTER — Encounter (HOSPITAL_COMMUNITY): Payer: Self-pay | Admitting: Emergency Medicine

## 2018-04-21 DIAGNOSIS — Z79899 Other long term (current) drug therapy: Secondary | ICD-10-CM | POA: Insufficient documentation

## 2018-04-21 DIAGNOSIS — R197 Diarrhea, unspecified: Secondary | ICD-10-CM | POA: Insufficient documentation

## 2018-04-21 DIAGNOSIS — R112 Nausea with vomiting, unspecified: Secondary | ICD-10-CM | POA: Insufficient documentation

## 2018-04-21 LAB — COMPREHENSIVE METABOLIC PANEL
ALBUMIN: 4.9 g/dL (ref 3.5–5.0)
ALT: 24 U/L (ref 0–44)
ANION GAP: 7 (ref 5–15)
AST: 25 U/L (ref 15–41)
Alkaline Phosphatase: 52 U/L (ref 38–126)
BILIRUBIN TOTAL: 0.9 mg/dL (ref 0.3–1.2)
BUN: 11 mg/dL (ref 6–20)
CO2: 25 mmol/L (ref 22–32)
Calcium: 9.6 mg/dL (ref 8.9–10.3)
Chloride: 105 mmol/L (ref 98–111)
Creatinine, Ser: 0.84 mg/dL (ref 0.44–1.00)
GFR calc Af Amer: >60 mL/min (ref >60)
GFR calc non Af Amer: >60 mL/min (ref >60)
GLUCOSE: 102 mg/dL — AB (ref 70–99)
POTASSIUM: 4.1 mmol/L (ref 3.5–5.1)
Sodium: 137 mmol/L (ref 135–145)
Total Protein: 8.3 g/dL — ABNORMAL HIGH (ref 6.5–8.1)

## 2018-04-21 LAB — CBC
HEMATOCRIT: 47.1 % — AB (ref 36.0–46.0)
HEMOGLOBIN: 16 g/dL — AB (ref 12.0–15.0)
MCH: 31.7 pg (ref 26.0–34.0)
MCHC: 34 g/dL (ref 30.0–36.0)
MCV: 93.5 fL (ref 80.0–100.0)
Platelets: 278 10*3/uL (ref 150–400)
RBC: 5.04 MIL/uL (ref 3.87–5.11)
RDW: 12.7 % (ref 11.5–15.5)
WBC: 7.9 10*3/uL (ref 4.0–10.5)
nRBC: 0 % (ref 0.0–0.2)

## 2018-04-21 LAB — URINALYSIS, ROUTINE W REFLEX MICROSCOPIC
Bilirubin Urine: NEGATIVE
GLUCOSE, UA: NEGATIVE mg/dL
Hgb urine dipstick: NEGATIVE
Ketones, ur: NEGATIVE mg/dL
Nitrite: NEGATIVE
PROTEIN: NEGATIVE mg/dL
Specific Gravity, Urine: 1.025 (ref 1.005–1.030)
pH: 5 (ref 5.0–8.0)

## 2018-04-21 LAB — LIPASE, BLOOD: Lipase: 33 U/L (ref 11–51)

## 2018-04-21 LAB — PREGNANCY, URINE: PREG TEST UR: NEGATIVE

## 2018-04-21 MED ORDER — ONDANSETRON 8 MG PO TBDP
8.0000 mg | ORAL_TABLET | Freq: Once | ORAL | Status: AC
  Administered 2018-04-21: 8 mg via ORAL
  Filled 2018-04-21: qty 1

## 2018-04-21 MED ORDER — ONDANSETRON 8 MG PO TBDP: 8.0000 mg | ORAL_TABLET | Freq: Three times a day (TID) | ORAL | 0 refills | Status: DC | PRN

## 2018-04-21 NOTE — ED Provider Notes (Signed)
[facility] EMERGENCY DEPARTMENT Provider Note   CSN: [id number] Arrival date & time: 04/21/18  1112     History   Chief Complaint Chief Complaint  Patient presents with  . Emesis  . Diarrhea    HPI [name] is a 33 y.o. female.  HPI   [name] is a 33 y.o. female who presents to the Emergency Department complaining of intermittent episodes of vomiting and loose stools since 630 this morning.  She describes several episodes of vomiting and has been unable to tolerate fluids since onset.  She states that her son had similar symptoms 3 days ago.  She denies fever, chills, cough and dysuria.  No abdominal pain.  She has not taken any medications for symptomatic relief.    Past Medical History:  Diagnosis Date  . No pertinent past medical history     Patient Active Problem List   Diagnosis Date Noted  . S/P cesarean section 05/19/2012    Past Surgical History:  Procedure Laterality Date  . CESAREAN SECTION N/A 05/19/2012   Procedure: Primary Cesarean Section;  Surgeon: Lavina Hamman, MD;  Location: [facility];  Service: Obstetrics;  Laterality: N/A;  Primary Cesarean Section  . FOOT SURGERY  as child   arch corrections     OB History    Gravida  3   Para  3   Term  3   Preterm  0   AB  0   Living  2     SAB  0   TAB  0   Ectopic  0   Multiple  0   Live Births               Home Medications    Prior to Admission medications   Medication Sig Start Date End Date Taking? Authorizing Provider  azithromycin (ZITHROMAX) 250 MG tablet Take first 2 tablets together, then 1 every day until finished. 04/04/18   Sianne Tejada, PA-C  cephALEXin (KEFLEX) 500 MG capsule Take 1 capsule (500 mg total) by mouth 2 (two) times daily. 02/08/17   Gilda Crease, MD  doxycycline (VIBRAMYCIN) 100 MG capsule Take 1 capsule (100 mg total) by mouth 2 (two) times daily. Please take with food 10/16/17   [name], PA-C  famotidine (PEPCID) 20  MG tablet Take 1 tablet (20 mg total) by mouth 2 (two) times daily. 09/28/16   Devoria Albe, MD  HYDROcodone-acetaminophen (NORCO/VICODIN) 5-325 MG tablet Take 1-2 tablets by mouth every 4 (four) hours as needed. Take with food 10/16/17   [name], PA-C  ibuprofen (ADVIL,MOTRIN) 600 MG tablet Take 1 tablet (600 mg total) by mouth 4 (four) times daily. 10/16/17   [name], PA-C  magic mouthwash w/lidocaine SOLN Take 5 mLs by mouth 3 (three) times daily as needed for mouth pain. Swish and spit, do not swallow 04/04/18   Kitiara Hintze, PA-C  metroNIDAZOLE (FLAGYL) 500 MG tablet Take 1 tablet (500 mg total) 2 (two) times daily by mouth. 02/10/17   Margarita Grizzle, MD  ondansetron (ZOFRAN ODT) 8 MG disintegrating tablet Take 1 tablet (8 mg total) by mouth every 8 (eight) hours as needed for nausea or vomiting. 04/21/18   Elleana Stillson, PA-C  phenazopyridine (PYRIDIUM) 200 MG tablet Take 1 tablet (200 mg total) by mouth 3 (three) times daily as needed for pain. 02/08/17   Gilda Crease, MD  predniSONE (DELTASONE) 20 MG tablet Take 3 po QD x 3d , then 2 po QD x  3d then 1 po QD x 3d 09/28/16   Devoria Albe, MD  pseudoephedrine (SUDAFED 12 HOUR) 120 MG 12 hr tablet Take 1 tablet (120 mg total) by mouth 2 (two) times daily. 04/04/18   Pauline Aus, PA-C    Family History History reviewed. No pertinent family history.  Social History Social History   Tobacco Use  . Smoking status: Never Smoker  . Smokeless tobacco: Never Used  Substance Use Topics  . Alcohol use: No  . Drug use: No     Allergies   Bee venom; Mango flavor; Peanut-containing drug products; and Penicillins   Review of Systems Review of Systems  Constitutional: Positive for appetite change. Negative for activity change, chills and fever.  HENT: Negative for congestion, sore throat and trouble swallowing.   Respiratory: Negative for shortness of breath.   Cardiovascular: Negative for chest pain.  Gastrointestinal:  Positive for abdominal pain, nausea and vomiting. Negative for blood in stool.  Genitourinary: Negative for decreased urine volume, difficulty urinating, dysuria and flank pain.  Musculoskeletal: Negative for back pain.  Skin: Negative for color change and rash.  Neurological: Negative for dizziness, weakness and numbness.  Hematological: Negative for adenopathy.     Physical Exam Updated Vital Signs BP (!) 104/56 (BP Location: Right Arm)   Pulse 74   Temp 98.3 F (36.8 C) (Oral)   Resp 12   Ht 5\' 4"  (1.626 m)   Wt 80.2 kg   LMP 03/31/2018   SpO2 97%   BMI 30.36 kg/m   Physical Exam Vitals signs and nursing note reviewed.  Constitutional:      General: She is not in acute distress.    Appearance: She is well-developed. She is not ill-appearing or toxic-appearing.  HENT:     Head: Normocephalic and atraumatic.     Mouth/Throat:     Mouth: Mucous membranes are moist.     Pharynx: Oropharynx is clear. No posterior oropharyngeal erythema.  Neck:     Musculoskeletal: No neck rigidity.  Cardiovascular:     Rate and Rhythm: Normal rate and regular rhythm.     Heart sounds: Normal heart sounds. No murmur.  Pulmonary:     Effort: Pulmonary effort is normal. No respiratory distress.     Breath sounds: Normal breath sounds.  Abdominal:     General: Bowel sounds are normal. There is no distension.     Palpations: Abdomen is soft. There is no mass.     Tenderness: There is no abdominal tenderness. There is no guarding or rebound.  Musculoskeletal: Normal range of motion.  Lymphadenopathy:     Cervical: No cervical adenopathy.  Skin:    General: Skin is warm and dry.     Capillary Refill: Capillary refill takes less than 2 seconds.  Neurological:     General: No focal deficit present.     Mental Status: She is alert.     Motor: No weakness or abnormal muscle tone.     Coordination: Coordination normal.      ED Treatments / Results  Labs (all labs ordered are listed,  but only abnormal results are displayed) Labs Reviewed  COMPREHENSIVE METABOLIC PANEL - Abnormal; Notable for the following components:      Result Value   Glucose, Bld 102 (*)    Total Protein 8.3 (*)    All other components within normal limits  CBC - Abnormal; Notable for the following components:   Hemoglobin 16.0 (*)    HCT 47.1 (*)  All other components within normal limits  URINALYSIS, ROUTINE W REFLEX MICROSCOPIC - Abnormal; Notable for the following components:   APPearance HAZY (*)    Leukocytes, UA SMALL (*)    Bacteria, UA FEW (*)    All other components within normal limits  URINE CULTURE  LIPASE, BLOOD  PREGNANCY, URINE    EKG None  Radiology No results found.  Procedures Procedures (including critical care time)  Medications Ordered in ED Medications  ondansetron (ZOFRAN-ODT) disintegrating tablet 8 mg (8 mg Oral Given 04/21/18 1507)     Initial Impression / Assessment and Plan / ED Course  I have reviewed the triage vital signs and the nursing notes.  Pertinent labs & imaging results that were available during my care of the patient were reviewed by me and considered in my medical decision making (see chart for details).     1530 patient is resting comfortably.  Mucous membranes are moist, she is tolerating oral fluids without difficulty.  No further vomiting or diarrhea during ER stay.  I feel that her symptoms are likely viral. No clinical findings for acute surgical abdomen. She appears appropriate for d/c home,  I have discussed strict return precautions and patient agrees to treatment plan.  Final Clinical Impressions(s) / ED Diagnoses   Final diagnoses:  Nausea vomiting and diarrhea    ED Discharge Orders         Ordered    ondansetron (ZOFRAN ODT) 8 MG disintegrating tablet  Every 8 hours PRN     04/21/18 1559           Pauline Ausriplett, Franci Oshana, PA-C 04/21/18 1649    Bethann BerkshireZammit, Joseph, MD 04/23/18 1136

## 2018-04-21 NOTE — Discharge Instructions (Addendum)
Frequent small sips of clear fluids tonight and tomorrow, bland diet as tolerated starting tomorrow.  As discussed, return to the emergency room for any worsening symptoms such as fever, persistent vomiting or diarrhea or if you develop abdominal pain

## 2018-04-21 NOTE — ED Triage Notes (Signed)
Pt states she has been having vomiting and diarrhea since 630 this morning.  Denies abd pain.

## 2018-04-21 NOTE — ED Notes (Signed)
Pt given ginger ale to drink. 

## 2018-04-24 LAB — URINE CULTURE: Culture: 40000 — AB

## 2018-04-25 ENCOUNTER — Telehealth: Payer: Self-pay | Admitting: *Deleted

## 2018-04-25 NOTE — Telephone Encounter (Signed)
Post ED Visit - Positive Culture Follow-up  Culture report reviewed by antimicrobial stewardship pharmacist:  []  Enzo BiNathan Batchelder, Pharm.D. []  Celedonio MiyamotoJeremy Frens, Pharm.D., BCPS AQ-ID []  Garvin FilaMike Maccia, Pharm.D., BCPS []  Georgina PillionElizabeth Martin, [address] BCPS []  CentenaryMinh Pham, [address]Ph[address]D., BCPS, AAHIVP []  Estella HuskMichelle Turner, Pharm.D., BCPS, AAHIVP []  Lysle Pearlachel Rumbarger, PharmD, BCPS []  Phillips Climeshuy Dang, PharmD, BCPS []  Agapito GamesAlison Masters, PharmD, BCPS []  Verlan FriendsErin Deja, PharmD Ewing Schleinolton Whiteside, PharmD  Positive urine culture Symptoms believed to be from viral cause and no further patient follow-up is required at this time.  Virl AxeRobertson, Adasyn Mcadams [facility] 04/25/2018, 9:35 AM

## 2018-10-22 ENCOUNTER — Emergency Department (HOSPITAL_COMMUNITY)
Admission: EM | Admit: 2018-10-22 | Discharge: 2018-10-23 | Disposition: A | Discharge status: Home / Self Care | Payer: Self-pay | Attending: Emergency Medicine | Admitting: Emergency Medicine

## 2018-10-22 ENCOUNTER — Encounter (HOSPITAL_COMMUNITY): Payer: Self-pay

## 2018-10-22 ENCOUNTER — Other Ambulatory Visit: Payer: Self-pay

## 2018-10-22 DIAGNOSIS — H1031 Unspecified acute conjunctivitis, right eye: Secondary | ICD-10-CM | POA: Insufficient documentation

## 2018-10-22 DIAGNOSIS — Z79899 Other long term (current) drug therapy: Secondary | ICD-10-CM | POA: Insufficient documentation

## 2018-10-22 DIAGNOSIS — Z91010 Allergy to peanuts: Secondary | ICD-10-CM | POA: Insufficient documentation

## 2018-10-22 MED ORDER — TETRACAINE HCL 0.5 % OP SOLN
2.0000 [drp] | Freq: Once | OPHTHALMIC | Status: AC
  Administered 2018-10-22: 2 [drp] via OPHTHALMIC
  Filled 2018-10-22: qty 4

## 2018-10-22 MED ORDER — FLUORESCEIN SODIUM 1 MG OP STRP
1.0000 | ORAL_STRIP | Freq: Once | OPHTHALMIC | Status: DC
  Filled 2018-10-22: qty 1

## 2018-10-22 MED ORDER — OFLOXACIN 0.3 % OP SOLN
1.0000 [drp] | Freq: Four times a day (QID) | OPHTHALMIC | Status: DC
  Administered 2018-10-23: 1 [drp] via OPHTHALMIC
  Filled 2018-10-22: qty 5

## 2018-10-22 NOTE — Discharge Instructions (Signed)
Please read and follow all provided instructions.  Your diagnoses today include:  1. Acute bacterial conjunctivitis of right eye     Tests performed today include: Visual acuity testing to check your vision Fluorescein dye examination to look for scratches on your eye Tonometry to check the pressure inside of your eye Vital signs. See below for your results today.   Medications prescribed:   Take any prescribed medications only as directed.  Please place  Home care instructions:  Follow any educational materials contained in this packet. If you wear contact lenses, do not use them until your eye caregiver approves. Follow-up care is necessary to be sure the infection is healing if not completely resolved in 2-3 days. See your caregiver or eye specialist as suggested for followup.   If you have an eye infection, wash your hands often as this is very contagious and is easily spread from person to person.   Follow-up instructions: Please follow-up with your primary care doctor OR the opthalmologist listed in the next 2-3 days for further evaluation of your symptoms.  Return instructions:  Please return to the Emergency Department if you experience worsening symptoms.  Please return immediately if you develop severe pain, pus drainage, new change in vision, or fever. Please return if you have any other emergent concerns.  Additional Information:  Your vital signs today were: BP 135/83 (BP Location: Right Arm)    Pulse 84    Temp 98.4 F (36.9 C) (Oral)    Resp 16    Ht 5\' 4"  (1.626 m)    Wt 81.6 kg    LMP 10/06/2018    SpO2 100%    BMI 30.90 kg/m  If your blood pressure (BP) was elevated above 130/80 this visit, please have this repeated by your doctor within one month. ---------------

## 2018-10-22 NOTE — ED Notes (Signed)
Patient is wearing glasses during visual acuity.

## 2018-10-22 NOTE — ED Triage Notes (Signed)
Pt with right eye drainage, redness and itching since Sunday.

## 2018-10-23 NOTE — ED Provider Notes (Signed)
[facility]GENCY [facility]PART[facility] Provider Note   CSN: [id number] Arrival date & time: 10/22/18  2120     History   Chief Complaint Chief Complaint  Patient presents with  . Eye Pain    HPI [name] is a 33 y.o. female.     HPI  Patient is a 33 year old female with no significant past medical history presenting for right eye drainage.  She reports that this began 3 days ago.  Initially, she had some irritation without injury of her right eye.  This progressed to a red eye and some drainage.  She felt that her eye was matted shut in the mornings.  Denies any history of autoimmune disease.  She reports taking over-the-counter artificial tears for her symptoms.  Past Medical History:  Diagnosis Date  . No pertinent past medical history     Patient Active Problem List   Diagnosis Date Noted  . S/P cesarean section 05/19/2012    Past Surgical History:  Procedure Laterality Date  . CESAREAN SECTION N/A 05/19/2012   Procedure: Primary Cesarean Section;  Surgeon: Lavina Hammanodd Meisinger, MD;  Location: [facility];  Service: Obstetrics;  Laterality: N/A;  Primary Cesarean Section  . FOOT SURGERY  as child   arch corrections     OB History    Gravida  3   Para  3   Term  3   Preterm  0   AB  0   Living  2     SAB  0   TAB  0   Ectopic  0   Multiple  0   Live Births               Home Medications    Prior to Admission medications   Medication Sig Start Date End Date Taking? Authorizing Provider  azithromycin (ZITHROMAX) 250 MG tablet Take first 2 tablets together, then 1 every day until finished. 04/04/18   Triplett, Tammy, PA-C  cephALEXin (KEFLEX) 500 MG capsule Take 1 capsule (500 mg total) by mouth 2 (two) times daily. 02/08/17   Gilda CreasePollina, Christopher J, MD  doxycycline (VIBRAMYCIN) 100 MG capsule Take 1 capsule (100 mg total) by mouth 2 (two) times daily. Please take with food 10/16/17   Ivery QualeBryant, Hobson, PA-C  famotidine (PEPCID) 20 MG tablet Take 1  tablet (20 mg total) by mouth 2 (two) times daily. 09/28/16   Devoria AlbeKnapp, Iva, MD  HYDROcodone-acetaminophen (NORCO/VICODIN) 5-325 MG tablet Take 1-2 tablets by mouth every 4 (four) hours as needed. Take with food 10/16/17   Ivery QualeBryant, Hobson, PA-C  ibuprofen (ADVIL,MOTRIN) 600 MG tablet Take 1 tablet (600 mg total) by mouth 4 (four) times daily. 10/16/17   Ivery QualeBryant, Hobson, PA-C  magic mouthwash w/lidocaine SOLN Take 5 mLs by mouth 3 (three) times daily as needed for mouth pain. Swish and spit, do not swallow 04/04/18   Triplett, Tammy, PA-C  metroNIDAZOLE (FLAGYL) 500 MG tablet Take 1 tablet (500 mg total) 2 (two) times daily by mouth. 02/10/17   Margarita Grizzleay, Danielle, MD  ondansetron (ZOFRAN ODT) 8 MG disintegrating tablet Take 1 tablet (8 mg total) by mouth every 8 (eight) hours as needed for nausea or vomiting. 04/21/18   Triplett, Tammy, PA-C  phenazopyridine (PYRIDIUM) 200 MG tablet Take 1 tablet (200 mg total) by mouth 3 (three) times daily as needed for pain. 02/08/17   Gilda CreasePollina, Christopher J, MD  predniSONE (DELTASONE) 20 MG tablet Take 3 po QD x 3d , then 2 po QD x 3d then 1  po QD x 3d 09/28/16   Rolland Porter, MD  pseudoephedrine (SUDAFED 12 HOUR) 120 MG 12 hr tablet Take 1 tablet (120 mg total) by mouth 2 (two) times daily. 04/04/18   Kem Parkinson, PA-C    Family History No family history on file.  Social History Social History   Tobacco Use  . Smoking status: Never Smoker  . Smokeless tobacco: Never Used  Substance Use Topics  . Alcohol use: No  . Drug use: No     Allergies   Bee venom, Mango flavor, Peanut-containing drug products, and Penicillins   Review of Systems Review of Systems  Constitutional: Negative for chills and fever.  Eyes: Positive for discharge, redness and itching.  Musculoskeletal: Negative for arthralgias.     Physical Exam Updated Vital Signs BP 135/83 (BP Location: Right Arm)   Pulse 84   Temp 98.4 F (36.9 C) (Oral)   Resp 16   Ht 5\' 4"  (1.626 m)   Wt 81.6  kg   LMP 10/06/2018   SpO2 100%   BMI 30.90 kg/m   Physical Exam Vitals signs and nursing note reviewed.  Constitutional:      General: She is not in acute distress.    Appearance: She is well-developed. She is not diaphoretic.     Comments: Sitting comfortably in bed.  HENT:     Head: Normocephalic and atraumatic.  Eyes:     General:        Right eye: Discharge present.        Left eye: No discharge.     Extraocular Movements: Extraocular movements intact.     Pupils: Pupils are equal, round, and reactive to light.     Comments: Right eye exam: No proptosis.  No foreign body on lid eversion. No pain with extraocular motions.  Conjunctival erythema and scleral injection present.  On Woods lamp exam, there are no punctate uptakes.  Neck:     Musculoskeletal: Normal range of motion.  Cardiovascular:     Rate and Rhythm: Normal rate and regular rhythm.     Comments: Intact, 2+ radial pulse. Pulmonary:     Comments: Converses comfortably.  No audible wheeze or stridor. Abdominal:     General: There is no distension.  Musculoskeletal: Normal range of motion.  Skin:    General: Skin is warm and dry.  Neurological:     Mental Status: She is alert.     Comments: Cranial nerves intact to gross observation. Patient moves extremities without difficulty.  Psychiatric:        Behavior: Behavior normal.        Thought Content: Thought content normal.        Judgment: Judgment normal.      ED Treatments / Results  Labs (all labs ordered are listed, but only abnormal results are displayed) Labs Reviewed - No data to display  EKG None  Radiology No results found.  Procedures Procedures (including critical care time)  Medications Ordered in ED Medications  fluorescein ophthalmic strip 1 strip (has no administration in time range)  ofloxacin (OCUFLOX) 0.3 % ophthalmic solution 1 drop (1 drop Right Eye Given 10/23/18 0032)  tetracaine (PONTOCAINE) 0.5 % ophthalmic solution 2  drop (2 drops Right Eye Given by Other 10/22/18 2230)     Initial Impression / Assessment and Plan / ED Course  I have reviewed the triage vital signs and the nursing notes.  Pertinent labs & imaging results that were available during my care of the  patient were reviewed by me and considered in my medical decision making (see chart for details).        This is a well-appearing 33 year old female with no significant past medical history presenting for right eye erythema and drainage.  No preceding injury.  Examination without evidence of corneal abrasion.  Examination consistent with bacterial conjunctivitis.  Patient given Ocuflox drops.  She is instructed on proper care of the eye with bacterial conjunctivitis.  She is given return precautions for any increasing erythema, drainage or pain.  Patient is in understanding and agrees with the plan of care.  Final Clinical Impressions(s) / ED Diagnoses   Final diagnoses:  Acute bacterial conjunctivitis of right eye    ED Discharge Orders    None       Delia ChimesMurray, Yosgart Pavey B, PA-C 10/23/18 0051    Vanetta MuldersZackowski, Scott, MD 11/06/18 1622

## 2019-06-01 ENCOUNTER — Other Ambulatory Visit: Payer: Self-pay

## 2019-06-01 ENCOUNTER — Encounter (HOSPITAL_COMMUNITY): Payer: Self-pay | Admitting: Emergency Medicine

## 2019-06-01 ENCOUNTER — Emergency Department (HOSPITAL_COMMUNITY)
Admission: EM | Admit: 2019-06-01 | Discharge: 2019-06-01 | Disposition: A | Discharge status: Home / Self Care | Payer: Self-pay | Attending: Emergency Medicine | Admitting: Emergency Medicine

## 2019-06-01 DIAGNOSIS — K0889 Other specified disorders of teeth and supporting structures: Secondary | ICD-10-CM | POA: Insufficient documentation

## 2019-06-01 MED ORDER — CLINDAMYCIN HCL 150 MG PO CAPS: 300.0000 mg | ORAL_CAPSULE | Freq: Three times a day (TID) | ORAL | 0 refills | Status: DC

## 2019-06-01 MED ORDER — IBUPROFEN 800 MG PO TABS: 800.0000 mg | ORAL_TABLET | Freq: Three times a day (TID) | ORAL | 0 refills | Status: DC

## 2019-06-01 NOTE — Discharge Instructions (Addendum)
Continue warm salt water rinses.  Follow-up with a dentist soon.

## 2019-06-01 NOTE — ED Triage Notes (Signed)
PT c/o left lower dental pain x1 day with no fevers.

## 2019-06-01 NOTE — ED Provider Notes (Signed)
[facility] EMERGENCY DEPARTMENT Provider Note   CSN: [id number] Arrival date & time: 06/01/19  1744     History Chief Complaint  Patient presents with  . Dental Pain    [name] is a 34 y.o. female.  HPI      [name] is a 34 y.o. female who presents to the Emergency Department complaining of dental pain for 1 day.  She reports sharp, constant pain of her left lower teeth that radiates to her ear and to the level of her chin.  She reports having a fractured left lower tooth for some time, but also states that she developed worsening pain along her left lower wisdom tooth that occurred while eating.  She reports noticing some swelling of the surrounding gums.  She denies fever, chills, facial swelling, neck pain, difficulty swallowing or difficulty with opening and closing her mouth.  She has tried warm salt water rinses without relief.   Past Medical History:  Diagnosis Date  . No pertinent past medical history     Patient Active Problem List   Diagnosis Date Noted  . S/P cesarean section 05/19/2012    Past Surgical History:  Procedure Laterality Date  . CESAREAN SECTION N/A 05/19/2012   Procedure: Primary Cesarean Section;  Surgeon: Lavina Hamman, MD;  Location: [facility];  Service: Obstetrics;  Laterality: N/A;  Primary Cesarean Section  . FOOT SURGERY  as child   arch corrections     OB History    Gravida  3   Para  3   Term  3   Preterm  0   AB  0   Living  2     SAB  0   TAB  0   Ectopic  0   Multiple  0   Live Births              History reviewed. No pertinent family history.  Social History   Tobacco Use  . Smoking status: Never Smoker  . Smokeless tobacco: Never Used  Substance Use Topics  . Alcohol use: No  . Drug use: No    Home Medications Prior to Admission medications   Medication Sig Start Date End Date Taking? Authorizing Provider  azithromycin (ZITHROMAX) 250 MG tablet Take first 2 tablets together,  then 1 every day until finished. 04/04/18   Jalisia Puchalski, PA-C  cephALEXin (KEFLEX) 500 MG capsule Take 1 capsule (500 mg total) by mouth 2 (two) times daily. 02/08/17   Gilda Crease, MD  doxycycline (VIBRAMYCIN) 100 MG capsule Take 1 capsule (100 mg total) by mouth 2 (two) times daily. Please take with food 10/16/17   [name], PA-C  famotidine (PEPCID) 20 MG tablet Take 1 tablet (20 mg total) by mouth 2 (two) times daily. 09/28/16   Devoria Albe, MD  HYDROcodone-acetaminophen (NORCO/VICODIN) 5-325 MG tablet Take 1-2 tablets by mouth every 4 (four) hours as needed. Take with food 10/16/17   [name], PA-C  ibuprofen (ADVIL,MOTRIN) 600 MG tablet Take 1 tablet (600 mg total) by mouth 4 (four) times daily. 10/16/17   [name], PA-C  magic mouthwash w/lidocaine SOLN Take 5 mLs by mouth 3 (three) times daily as needed for mouth pain. Swish and spit, do not swallow 04/04/18   Laelia Angelo, PA-C  metroNIDAZOLE (FLAGYL) 500 MG tablet Take 1 tablet (500 mg total) 2 (two) times daily by mouth. 02/10/17   Margarita Grizzle, MD  ondansetron (ZOFRAN ODT) 8 MG disintegrating tablet Take 1  tablet (8 mg total) by mouth every 8 (eight) hours as needed for nausea or vomiting. 04/21/18   Dmitry Macomber, PA-C  phenazopyridine (PYRIDIUM) 200 MG tablet Take 1 tablet (200 mg total) by mouth 3 (three) times daily as needed for pain. 02/08/17   Orpah Greek, MD  predniSONE (DELTASONE) 20 MG tablet Take 3 po QD x 3d , then 2 po QD x 3d then 1 po QD x 3d 09/28/16   Rolland Porter, MD  pseudoephedrine (SUDAFED 12 HOUR) 120 MG 12 hr tablet Take 1 tablet (120 mg total) by mouth 2 (two) times daily. 04/04/18   Lorayne Getchell, PA-C    Allergies    Bee venom, Mango flavor, Peanut-containing drug products, and Penicillins  Review of Systems   Review of Systems  Constitutional: Negative for appetite change and fever.  HENT: Positive for dental problem and ear pain. Negative for congestion, facial  swelling, sore throat and trouble swallowing.   Eyes: Negative for pain and visual disturbance.  Musculoskeletal: Negative for neck pain and neck stiffness.  Neurological: Negative for dizziness, facial asymmetry and headaches.  Hematological: Negative for adenopathy.    Physical Exam Updated Vital Signs BP (!) 113/56 (BP Location: Right Arm)   Pulse 87   Temp 98.4 F (36.9 C) (Oral)   Resp 18   Ht 5\' 4"  (1.626 m)   Wt 82.6 kg   LMP 05/25/2019   SpO2 100%   BMI 31.24 kg/m   Physical Exam Vitals and nursing note reviewed.  Constitutional:      General: She is not in acute distress.    Appearance: Normal appearance. She is well-developed. She is not ill-appearing.  HENT:     Head:     Jaw: No trismus.     Right Ear: Tympanic membrane and ear canal normal.     Left Ear: Tympanic membrane and ear canal normal.     Mouth/Throat:     Mouth: Mucous membranes are moist.     Dentition: Dental tenderness, gingival swelling and dental caries present. No dental abscesses.     Pharynx: Oropharynx is clear. Uvula midline. No pharyngeal swelling, posterior oropharyngeal erythema or uvula swelling.     Comments: ttp of the left lower third molar.  Mild edema noted of the surrounding gums.  Also, there is tenderness of a partial dental avulsion of the left lower first molar with partially exposed dental filling.  No trismus or dental abscess noted.  Uvula is midline and non-edematous.   Cardiovascular:     Rate and Rhythm: Normal rate and regular rhythm.  Pulmonary:     Effort: Pulmonary effort is normal.     Breath sounds: Normal breath sounds.  Musculoskeletal:        General: Normal range of motion.     Cervical back: Normal range of motion.  Lymphadenopathy:     Cervical: No cervical adenopathy.  Skin:    General: Skin is warm and dry.  Neurological:     Mental Status: She is alert and oriented to person, place, and time.     Sensory: No sensory deficit.     Motor: No weakness  or abnormal muscle tone.     ED Results / Procedures / Treatments   Labs (all labs ordered are listed, but only abnormal results are displayed) Labs Reviewed - No data to display  EKG None  Radiology No results found.  Procedures Procedures (including critical care time)  Medications Ordered in ED Medications - No  data to display  ED Course  I have reviewed the triage vital signs and the nursing notes.  Pertinent labs & imaging results that were available during my care of the patient were reviewed by me and considered in my medical decision making (see chart for details).    MDM Rules/Calculators/A&P                      Patient with dental pain, no obvious dental abscess.  No trismus.  No concerning symptoms for Ludwig's angina.  She agrees to treatment plan with ibuprofen and clindamycin.  Referral information provided for local dentistry.  Return precautions discussed.   Final Clinical Impression(s) / ED Diagnoses Final diagnoses:  Pain, dental    Rx / DC Orders ED Discharge Orders    None       Rosey Bath 06/01/19 Fernande Bras, MD 06/04/19 [phone]

## 2019-07-14 ENCOUNTER — Other Ambulatory Visit: Payer: Self-pay

## 2019-07-14 ENCOUNTER — Emergency Department (HOSPITAL_COMMUNITY)
Admission: EM | Admit: 2019-07-14 | Discharge: 2019-07-14 | Disposition: A | Discharge status: Home / Self Care | Payer: Self-pay | Attending: Emergency Medicine | Admitting: Emergency Medicine

## 2019-07-14 ENCOUNTER — Encounter (HOSPITAL_COMMUNITY): Payer: Self-pay

## 2019-07-14 DIAGNOSIS — Z79899 Other long term (current) drug therapy: Secondary | ICD-10-CM | POA: Insufficient documentation

## 2019-07-14 DIAGNOSIS — R42 Dizziness and giddiness: Secondary | ICD-10-CM | POA: Insufficient documentation

## 2019-07-14 DIAGNOSIS — Z20822 Contact with and (suspected) exposure to covid-19: Secondary | ICD-10-CM | POA: Insufficient documentation

## 2019-07-14 DIAGNOSIS — J069 Acute upper respiratory infection, unspecified: Secondary | ICD-10-CM | POA: Insufficient documentation

## 2019-07-14 LAB — SARS CORONAVIRUS 2 (TAT 6-24 HRS): SARS Coronavirus 2: NEGATIVE

## 2019-07-14 MED ORDER — PSEUDOEPH-BROMPHEN-DM 30-2-10 MG/5ML PO SYRP: 5.0000 mL | ORAL_SOLUTION | Freq: Four times a day (QID) | ORAL | 0 refills | Status: AC | PRN

## 2019-07-14 NOTE — Discharge Instructions (Signed)
Thank you for allowing me to care for you today. Please return to the emergency department if you have new or worsening symptoms. Take your medications as instructed.  ° °

## 2019-07-14 NOTE — Progress Notes (Signed)
CSW has reviewed patients chart and notes that patient is without primary care and/or insurance. CSW will follow up with patient regarding this matter and provide assistance as needed.   Vraj Denardo M. Kholton Coate LCSWA Transitions of Care  Clinical Social Worker  Ph: [phone] 

## 2019-07-14 NOTE — ED Triage Notes (Signed)
Pt presents to ED with complaints of sore throat, nasal congestion and feeling off balance started yesterday. Pt denies fever.

## 2019-07-18 NOTE — ED Provider Notes (Signed)
[facility] EMERGENCY DEPARTMENT Provider Note   CSN: [id number] Arrival date & time: 07/14/19  1323     History Chief Complaint  Patient presents with  . Sore Throat  . Dizziness    [name] is a 34 y.o. female.  Patient presents to the ER for URI symptoms for the last few days. Reports sinus congestion, sore throat, slight cough and being off balance.        Past Medical History:  Diagnosis Date  . No pertinent past medical history     Patient Active Problem List   Diagnosis Date Noted  . S/P cesarean section 05/19/2012    Past Surgical History:  Procedure Laterality Date  . CESAREAN SECTION N/A 05/19/2012   Procedure: Primary Cesarean Section;  Surgeon: Lavina Hamman, MD;  Location: [facility];  Service: Obstetrics;  Laterality: N/A;  Primary Cesarean Section  . FOOT SURGERY  as child   arch corrections     OB History    Gravida  3   Para  3   Term  3   Preterm  0   AB  0   Living  2     SAB  0   TAB  0   Ectopic  0   Multiple  0   Live Births              No family history on file.  Social History   Tobacco Use  . Smoking status: Never Smoker  . Smokeless tobacco: Never Used  Substance Use Topics  . Alcohol use: No  . Drug use: No    Home Medications Prior to Admission medications   Medication Sig Start Date End Date Taking? Authorizing Provider  levonorgestrel (MIRENA, 52 MG,) 20 MCG/24HR IUD 1 Intra Uterine Device by Intrauterine route once. 07/03/12  Yes [provider]  brompheniramine-pseudoephedrine-DM 30-2-10 MG/5ML syrup Take 5 mLs by mouth 4 (four) times daily as needed for up to 7 days. 07/14/19 07/21/19  Arlyn Dunning, PA-C  famotidine (PEPCID) 20 MG tablet Take 1 tablet (20 mg total) by mouth 2 (two) times daily. 09/28/16   Devoria Albe, MD  HYDROcodone-acetaminophen (NORCO/VICODIN) 5-325 MG tablet Take 1-2 tablets by mouth every 4 (four) hours as needed. Take with food 10/16/17   [name], PA-C    ibuprofen (ADVIL) 800 MG tablet Take 1 tablet (800 mg total) by mouth 3 (three) times daily. Take with food 06/01/19   Triplett, Tammy, PA-C    Allergies    Bee venom, Mango flavor, Peanut-containing drug products, and Penicillins  Review of Systems   Review of Systems  Constitutional: Negative for chills and fever.  HENT: Positive for congestion, sinus pressure and sore throat. Negative for sneezing.   Eyes: Negative for photophobia and visual disturbance.  Respiratory: Negative for cough and shortness of breath.   Cardiovascular: Negative for chest pain.  Gastrointestinal: Negative for abdominal pain, nausea and vomiting.  Genitourinary: Negative for dysuria.  Musculoskeletal: Negative for arthralgias, back pain and myalgias.  Skin: Negative for wound.  Allergic/Immunologic: Negative for immunocompromised state.  Neurological: Positive for dizziness. Negative for headaches.    Physical Exam Updated Vital Signs BP 116/70   Pulse 72   Temp 98.8 F (37.1 C) (Oral)   Resp 16   Ht 5\' 4"  (1.626 m)   Wt 81.6 kg   LMP 07/05/2019   SpO2 99%   BMI 30.90 kg/m   Physical Exam Vitals and nursing note reviewed.  Constitutional:  Appearance: Normal appearance. She is well-developed. She is not ill-appearing, toxic-appearing or diaphoretic.  HENT:     Head: Normocephalic and atraumatic.     Right Ear: Tympanic membrane normal. Tympanic membrane is not erythematous.     Left Ear: Tympanic membrane normal. Tympanic membrane is not erythematous.     Nose: No congestion or rhinorrhea.  Eyes:     Conjunctiva/sclera: Conjunctivae normal.     Pupils: Pupils are equal, round, and reactive to light.  Cardiovascular:     Heart sounds: Normal heart sounds.  Pulmonary:     Effort: Pulmonary effort is normal.  Abdominal:     General: Bowel sounds are normal.     Palpations: Abdomen is soft.  Skin:    General: Skin is dry.  Neurological:     Mental Status: She is alert.   Psychiatric:        Mood and Affect: Mood normal.     ED Results / Procedures / Treatments   Labs (all labs ordered are listed, but only abnormal results are displayed) Labs Reviewed  SARS CORONAVIRUS 2 (TAT 6-24 HRS)    EKG None  Radiology No results found.  Procedures Procedures (including critical care time)  Medications Ordered in ED Medications - No data to display  ED Course  I have reviewed the triage vital signs and the nursing notes.  Pertinent labs & imaging results that were available during my care of the patient were reviewed by me and considered in my medical decision making (see chart for details).    MDM Rules/Calculators/A&P                      Based on review of vitals, medical screening exam, lab work and/or imaging, there does not appear to be an acute, emergent etiology for the patient's symptoms. Counseled pt on good return precautions and encouraged both PCP and ED follow-up as needed.  Prior to discharge, I also discussed incidental imaging findings with patient in detail and advised appropriate, recommended follow-up in detail.  Clinical Impression: 1. Viral upper respiratory tract infection     Disposition: Discharge  Prior to providing a prescription for a controlled substance, I independently reviewed the patient's recent prescription history on the [place]. The patient had no recent or regular prescriptions and was deemed appropriate for a brief, less than 3 day prescription of narcotic for acute analgesia.  This note was prepared with assistance of Systems analyst. Occasional wrong-word or sound-a-like substitutions may have occurred due to the inherent limitations of voice recognition software.  Final Clinical Impression(s) / ED Diagnoses Final diagnoses:  Viral upper respiratory tract infection    Rx / DC Orders ED Discharge Orders         Ordered     brompheniramine-pseudoephedrine-DM 30-2-10 MG/5ML syrup  4 times daily PRN     07/14/19 1353           Kristine Royal 07/18/19 2042    Lacretia Leigh, MD 07/19/19 1718

## 2019-11-07 ENCOUNTER — Other Ambulatory Visit: Payer: Self-pay

## 2019-11-07 ENCOUNTER — Emergency Department (HOSPITAL_COMMUNITY)
Admission: EM | Admit: 2019-11-07 | Discharge: 2019-11-07 | Disposition: A | Discharge status: Home / Self Care | Payer: Self-pay | Attending: Emergency Medicine | Admitting: Emergency Medicine

## 2019-11-07 ENCOUNTER — Encounter (HOSPITAL_COMMUNITY): Payer: Self-pay | Admitting: Emergency Medicine

## 2019-11-07 DIAGNOSIS — Z91010 Allergy to peanuts: Secondary | ICD-10-CM | POA: Insufficient documentation

## 2019-11-07 DIAGNOSIS — H1031 Unspecified acute conjunctivitis, right eye: Secondary | ICD-10-CM | POA: Insufficient documentation

## 2019-11-07 DIAGNOSIS — H10022 Other mucopurulent conjunctivitis, left eye: Secondary | ICD-10-CM

## 2019-11-07 DIAGNOSIS — L299 Pruritus, unspecified: Secondary | ICD-10-CM | POA: Insufficient documentation

## 2019-11-07 MED ORDER — POLYMYXIN B-TRIMETHOPRIM 10000-0.1 UNIT/ML-% OP SOLN: 1.0000 [drp] | OPHTHALMIC | 0 refills | Status: DC

## 2019-11-07 NOTE — ED Triage Notes (Signed)
Pt states she woke up with her right eye draining and itching.

## 2019-11-07 NOTE — ED Provider Notes (Signed)
[facility] EMERGENCY DEPARTMENT Provider Note   CSN: [id number] Arrival date & time: 11/07/19  1524     History Chief Complaint  Patient presents with  . Eye Problem    [name] is a 34 y.o. female who presents the emergency department with a chief complaint of right eye irritation.  Onset yesterday.  She complains of burning, watering, itching, lash mattering in the right eye.  She had this previously and was told was pinkeye.  She denies changes in vision, eye pain.  HPI     Past Medical History:  Diagnosis Date  . No pertinent past medical history     Patient Active Problem List   Diagnosis Date Noted  . S/P cesarean section 05/19/2012    Past Surgical History:  Procedure Laterality Date  . CESAREAN SECTION N/A 05/19/2012   Procedure: Primary Cesarean Section;  Surgeon: Lavina Hamman, MD;  Location: [facility];  Service: Obstetrics;  Laterality: N/A;  Primary Cesarean Section  . FOOT SURGERY  as child   arch corrections     OB History    Gravida  3   Para  3   Term  3   Preterm  0   AB  0   Living  2     SAB  0   TAB  0   Ectopic  0   Multiple  0   Live Births              No family history on file.  Social History   Tobacco Use  . Smoking status: Never Smoker  . Smokeless tobacco: Never Used  Vaping Use  . Vaping Use: Never used  Substance Use Topics  . Alcohol use: No  . Drug use: No    Home Medications Prior to Admission medications   Medication Sig Start Date End Date Taking? Authorizing Provider  famotidine (PEPCID) 20 MG tablet Take 1 tablet (20 mg total) by mouth 2 (two) times daily. 09/28/16   Devoria Albe, MD  HYDROcodone-acetaminophen (NORCO/VICODIN) 5-325 MG tablet Take 1-2 tablets by mouth every 4 (four) hours as needed. Take with food 10/16/17   [name], PA-C  ibuprofen (ADVIL) 800 MG tablet Take 1 tablet (800 mg total) by mouth 3 (three) times daily. Take with food 06/01/19   Triplett, Tammy, PA-C    levonorgestrel (MIRENA, 52 MG,) 20 MCG/24HR IUD 1 Intra Uterine Device by Intrauterine route once. 07/03/12   [provider]    Allergies    Bee venom, Mango flavor, Peanut-containing drug products, and Penicillins  Review of Systems   Review of Systems  Eyes: Positive for discharge, redness and itching. Negative for pain.     Physical Exam Updated Vital Signs BP (!) 124/64 (BP Location: Right Arm)   Pulse 86   Temp 98.5 F (36.9 C) (Oral)   Resp 16   Ht 5\' 4"  (1.626 m)   Wt 82.6 kg   SpO2 99%   BMI 31.24 kg/m   Physical Exam Vitals and nursing note reviewed.  Constitutional:      General: She is not in acute distress.    Appearance: She is well-developed. She is not diaphoretic.  HENT:     Head: Normocephalic and atraumatic.  Eyes:     General: Lids are normal. No scleral icterus.       Right eye: Discharge present.     Conjunctiva/sclera:     Right eye: Right conjunctiva is injected. No chemosis, exudate or hemorrhage.  Cardiovascular:     Rate and Rhythm: Normal rate and regular rhythm.     Heart sounds: Normal heart sounds. No murmur heard.  No friction rub. No gallop.   Pulmonary:     Effort: Pulmonary effort is normal. No respiratory distress.     Breath sounds: Normal breath sounds.  Abdominal:     General: Bowel sounds are normal. There is no distension.     Palpations: Abdomen is soft. There is no mass.     Tenderness: There is no abdominal tenderness. There is no guarding.  Musculoskeletal:     Cervical back: Normal range of motion.  Skin:    General: Skin is warm and dry.  Neurological:     Mental Status: She is alert and oriented to person, place, and time.  Psychiatric:        Behavior: Behavior normal.     ED Results / Procedures / Treatments   Labs (all labs ordered are listed, but only abnormal results are displayed) Labs Reviewed - No data to display  EKG None  Radiology No results found.  Procedures Procedures  (including critical care time)  Medications Ordered in ED Medications - No data to display  ED Course  I have reviewed the triage vital signs and the nursing notes.  Pertinent labs & imaging results that were available during my care of the patient were reviewed by me and considered in my medical decision making (see chart for details).    MDM Rules/Calculators/A&P                          Patient with pinkeye of the right eye.  Discharged with Polysporin ophthalmic solution.  Discussed outpatient return precautions, no eye pain or changes in vision, no concern for acute angle-closure glaucoma.  Patient appears appropriate for discharge at this time. Final Clinical Impression(s) / ED Diagnoses Final diagnoses:  None    Rx / DC Orders ED Discharge Orders    None       Arthor Captain, PA-C 11/07/19 Aretha Parrot, MD 11/10/19 [phone]

## 2019-11-07 NOTE — Discharge Instructions (Signed)
Contact a health care provider if:  You have a fever.  Your symptoms do not get better after 10 days.  Get help right away if you have:  A fever and your symptoms suddenly get worse.  Severe pain when you move your eye.  Facial pain, redness, or swelling.  Sudden loss of vision.

## 2020-04-17 ENCOUNTER — Other Ambulatory Visit: Payer: Self-pay

## 2020-04-17 ENCOUNTER — Emergency Department (HOSPITAL_COMMUNITY)
Admission: EM | Admit: 2020-04-17 | Discharge: 2020-04-17 | Disposition: A | Discharge status: Home / Self Care | Payer: HRSA Program | Attending: Emergency Medicine | Admitting: Emergency Medicine

## 2020-04-17 ENCOUNTER — Encounter (HOSPITAL_COMMUNITY): Payer: Self-pay | Admitting: *Deleted

## 2020-04-17 DIAGNOSIS — U071 COVID-19: Secondary | ICD-10-CM

## 2020-04-17 DIAGNOSIS — J029 Acute pharyngitis, unspecified: Secondary | ICD-10-CM | POA: Diagnosis present

## 2020-04-17 LAB — RESP PANEL BY RT-PCR (FLU A&B, COVID) ARPGX2
Influenza A by PCR: NEGATIVE
Influenza B by PCR: NEGATIVE
SARS Coronavirus 2 by RT PCR: POSITIVE — AB

## 2020-04-17 LAB — GROUP A STREP BY PCR: Group A Strep by PCR: NOT DETECTED

## 2020-04-17 NOTE — ED Provider Notes (Signed)
[facility] EMERGENCY DEPARTMENT Provider Note   CSN: [id number] Arrival date & time: 04/17/20  1417     History Chief Complaint  Patient presents with  . Sore Throat    [name] is a 35 y.o. female who presents for evaluation of sore throat, generalized body aches, chills, fatigue that began today.  She states she has not noted any fevers at home but has had chills.  She states her throat feels scratchy.  She still been able to tolerate p.o.  She she has had a cough and states it is productive of phlegm.  She has also had some rhinorrhea, congestion.  She has not been vaccinated for COVID.  She states several coworkers have tested positive for COVID.  She does not know if she has been around them.  She does not smoke any cigarettes.  No prior history of COPD or asthma.  The history is provided by the patient.       Past Medical History:  Diagnosis Date  . No pertinent past medical history     Patient Active Problem List   Diagnosis Date Noted  . S/P cesarean section 05/19/2012    Past Surgical History:  Procedure Laterality Date  . CESAREAN SECTION N/A 05/19/2012   Procedure: Primary Cesarean Section;  Surgeon: Lavina Hamman, MD;  Location: [facility];  Service: Obstetrics;  Laterality: N/A;  Primary Cesarean Section  . FOOT SURGERY  as child   arch corrections     OB History    Gravida  3   Para  3   Term  3   Preterm  0   AB  0   Living  2     SAB  0   IAB  0   Ectopic  0   Multiple  0   Live Births              History reviewed. No pertinent family history.  Social History   Tobacco Use  . Smoking status: Never Smoker  . Smokeless tobacco: Never Used  Vaping Use  . Vaping Use: Never used  Substance Use Topics  . Alcohol use: No  . Drug use: No    Home Medications Prior to Admission medications   Medication Sig Start Date End Date Taking? Authorizing Provider  famotidine (PEPCID) 20 MG tablet Take 1 tablet (20 mg total) by  mouth 2 (two) times daily. 09/28/16   Devoria Albe, MD  HYDROcodone-acetaminophen (NORCO/VICODIN) 5-325 MG tablet Take 1-2 tablets by mouth every 4 (four) hours as needed. Take with food 10/16/17   [name], PA-C  ibuprofen (ADVIL) 800 MG tablet Take 1 tablet (800 mg total) by mouth 3 (three) times daily. Take with food 06/01/19   Triplett, Tammy, PA-C  levonorgestrel (MIRENA, 52 MG,) 20 MCG/24HR IUD 1 Intra Uterine Device by Intrauterine route once. 07/03/12   [provider]  trimethoprim-polymyxin b (POLYTRIM) ophthalmic solution Place 1 drop into the right eye every 4 (four) hours. 11/07/19   Arthor Captain, PA-C    Allergies    Bee venom, Mango flavor, Peanut-containing drug products, and Penicillins  Review of Systems   Review of Systems  Constitutional: Positive for chills and fatigue. Negative for fever.  HENT: Positive for congestion, rhinorrhea and sore throat.   Respiratory: Positive for cough. Negative for shortness of breath.   Cardiovascular: Negative for chest pain.  All other systems reviewed and are negative.   Physical Exam Updated Vital Signs BP 114/86 (BP Location:  Left Arm)   Pulse 81   Temp 98.7 F (37.1 C) (Oral)   Resp 18   Ht 5\' 4"  (1.626 m)   Wt 81.6 kg   LMP 03/27/2020   SpO2 100%   BMI 30.90 kg/m   Physical Exam Vitals and nursing note reviewed.  Constitutional:      Appearance: She is well-developed and well-nourished.     Comments: Sitting comfortably on examination table  HENT:     Head: Normocephalic and atraumatic.     Mouth/Throat:     Comments: Posterior oropharynx is clear without any erythema, edema. Uvula is midline.  No trismus. Airway is patent, phonation is intact. Eyes:     General: No scleral icterus.       Right eye: No discharge.        Left eye: No discharge.     Extraocular Movements: EOM normal.     Conjunctiva/sclera: Conjunctivae normal.  Pulmonary:     Effort: Pulmonary effort is normal.     Comments: Lungs  clear to auscultation bilaterally.  Symmetric chest rise.  No wheezing, rales, rhonchi. Skin:    General: Skin is warm and dry.  Neurological:     Mental Status: She is alert.  Psychiatric:        Mood and Affect: Mood and affect normal.        Speech: Speech normal.        Behavior: Behavior normal.     ED Results / Procedures / Treatments   Labs (all labs ordered are listed, but only abnormal results are displayed) Labs Reviewed  RESP PANEL BY RT-PCR (FLU A&B, COVID) ARPGX2 - Abnormal; Notable for the following components:      Result Value   SARS Coronavirus 2 by RT PCR POSITIVE (*)    All other components within normal limits  GROUP A STREP BY PCR    EKG None  Radiology No results found.  Procedures Procedures (including critical care time)  Medications Ordered in ED Medications - No data to display  ED Course  I have reviewed the triage vital signs and the nursing notes.  Pertinent labs & imaging results that were available during my care of the patient were reviewed by me and considered in my medical decision making (see chart for details).    MDM Rules/Calculators/A&P                          35 year old female who presents for evaluation of scratchy throat, nasal congestion, rhinorrhea, cough, generalized body aches, chills that began today.  She reports that she has not been vaccinated for COVID.  She reports that coworkers have tested positive for COVID.  On initial arrival, she is afebrile, nontoxic-appearing.  Vital signs are stable.  Lungs clear to auscultation.  Posterior oropharynx is clear.  History/physical exam not concerning for peritonsillar abscess, Ludwig angina.  Strep and COVID ordered at triage.  Strep is negative, COVID is positive.  Discussed results with patient.  At this time, patient is very well-appearing.  Encouraged at home supportive care measures. At this time, patient exhibits no emergent life-threatening condition that require  further evaluation in ED. Patient had ample opportunity for questions and discussion. All patient's questions were answered with full understanding. Strict return precautions discussed. Patient expresses understanding and agreement to plan.   [name] was evaluated in Emergency Department on 04/17/2020 for the symptoms described in the history of present illness. She was  evaluated in the context of the global COVID-19 pandemic, which necessitated consideration that the patient might be at risk for infection with the SARS-CoV-2 virus that causes COVID-19. Institutional protocols and algorithms that pertain to the evaluation of patients at risk for COVID-19 are in a state of rapid change based on information released by regulatory bodies including the CDC and federal and state organizations. These policies and algorithms were followed during the patient's care in the ED.  Portions of this note were generated with Scientist, clinical (histocompatibility and immunogenetics). Dictation errors may occur despite best attempts at proofreading.   Final Clinical Impression(s) / ED Diagnoses Final diagnoses:  COVID-19    Rx / DC Orders ED Discharge Orders    None       Maxwell Caul, PA-C 04/17/20 1755    Vanetta Mulders, MD 04/28/20 [phone]

## 2020-04-17 NOTE — Discharge Instructions (Signed)
You can take Tylenol or Ibuprofen as directed for pain. You can alternate Tylenol and Ibuprofen every 4 hours. If you take Tylenol at 1pm, then you can take Ibuprofen at 5pm. Then you can take Tylenol again at 9pm.   Make sure you are staying hydrated and drinking plenty of fluids.   Return to the Emergency Dept for any difficulty breathing, vomiting/inability to keep any food or liquid downs, chest pain or any other worsening or concerning symptoms.

## 2020-04-17 NOTE — ED Triage Notes (Signed)
Pt with chills, fever and sore throat since yesterday.  Pt states she has coworkers that have tested positive.  Pt denies taking Covid vaccines or flu vaccine.

## 2020-04-18 ENCOUNTER — Telehealth: Payer: Self-pay

## 2020-04-18 NOTE — Telephone Encounter (Signed)
I connected by phone with [name] on 04/18/2020 at 2:01 PM to discuss the potential use of a new treatment for mild to moderate COVID-19 viral infection in non-hospitalized patients.  This patient is a 35 y.o. female that meets the FDA criteria for Emergency Use Authorization of COVID monoclonal antibody sotrovimab.  Has a (+) direct SARS-CoV-2 viral test result  Has mild or moderate COVID-19   Is NOT hospitalized due to COVID-19  Is within 10 days of symptom onset  Has at least one of the high risk factor(s) for progression to severe COVID-19 and/or hospitalization as defined in EUA.  Specific high risk criteria : Other high risk medical condition per CDC:  None   I have spoken and communicated the following to the patient or parent/caregiver regarding COVID monoclonal antibody treatment:  1. FDA has authorized the emergency use for the treatment of mild to moderate COVID-19 in adults and pediatric patients with positive results of direct SARS-CoV-2 viral testing who are 47 years of age and older weighing at least 40 kg, and who are at high risk for progressing to severe COVID-19 and/or hospitalization.  2. The significant known and potential risks and benefits of COVID monoclonal antibody, and the extent to which such potential risks and benefits are unknown.  3. Information on available alternative treatments and the risks and benefits of those alternatives, including clinical trials.  4. Patients treated with COVID monoclonal antibody should continue to self-isolate and use infection control measures (e.g., wear mask, isolate, social distance, avoid sharing personal items, clean and disinfect "high touch" surfaces, and frequent handwashing) according to CDC guidelines.   5. The patient or parent/caregiver has the option to accept or refuse COVID monoclonal antibody treatment.  After reviewing this information with the patient, the patient has DECLINED offer to receive the  infusion. [name], RN 04/18/2020 2:01 PM  Pt. States she is feeling better and declines further treatment.

## 2020-04-22 ENCOUNTER — Telehealth (INDEPENDENT_AMBULATORY_CARE_PROVIDER_SITE_OTHER): Payer: HRSA Program | Admitting: Nurse Practitioner

## 2020-04-22 DIAGNOSIS — U071 COVID-19: Secondary | ICD-10-CM | POA: Diagnosis not present

## 2020-04-22 MED ORDER — AZITHROMYCIN 250 MG PO TABS: ORAL_TABLET | ORAL | 0 refills | Status: DC

## 2020-04-22 MED ORDER — BENZONATATE 100 MG PO CAPS: 100.0000 mg | ORAL_CAPSULE | Freq: Two times a day (BID) | ORAL | 0 refills | Status: DC | PRN

## 2020-04-22 NOTE — Progress Notes (Signed)
Virtual Visit via Telephone Note  I connected with [name] on 04/22/20 at  2:30 PM EST by telephone and verified that I am speaking with the correct person using two identifiers.  Location: Patient: home Provider: remote   I discussed the limitations, risks, security and privacy concerns of performing an evaluation and management service by telephone and the availability of in person appointments. I also discussed with the patient that there may be a patient responsible charge related to this service. The patient expressed understanding and agreed to proceed.   History of Present Illness:  Patient presents today for virtual televisit.  She was seen in the ED on 04/17/2018.  She was diagnosed with COVID.  Patient states that she has been having ongoing congestion and cough.  She is trying to stay active.  She is trying to eat and stay well-hydrated. Denies f/c/s, n/v/d, hemoptysis, PND, chest pain or edema.     Observations/Objective:  Vitals with BMI 04/17/2020 04/17/2020 04/17/2020  Height - 5\' 4"  -  Weight - 180 lbs -  BMI - 30.88 -  Systolic 114 - 118  Diastolic 86 - 76  Pulse 81 - 92      Assessment and Plan:  Covid 19 Cough:   Stay well hydrated  Stay active  Deep breathing exercises  May start vitamin C 2,000 mg daily, vitamin D3 2,000 IU daily, Zinc 220 mg daily, and Quercetin 500 mg twice daily  May take tylenol or fever or pain  May take mucinex twice daily  Will order azithromycin  Will order tessalon perles    Follow Up Instructions:  Follow up if needed     I discussed the assessment and treatment plan with the patient. The patient was provided an opportunity to ask questions and all were answered. The patient agreed with the plan and demonstrated an understanding of the instructions.   The patient was advised to call back or seek an in-person evaluation if the symptoms worsen or if the condition fails to improve as anticipated.  I provided  22 minutes of non-face-to-face time during this encounter.   , NP

## 2020-04-25 NOTE — Patient Instructions (Signed)
Covid 19 Cough:   Stay well hydrated  Stay active  Deep breathing exercises  May start vitamin C 2,000 mg daily, vitamin D3 2,000 IU daily, Zinc 220 mg daily, and Quercetin 500 mg twice daily  May take tylenol or fever or pain  May take mucinex twice daily  Will order azithromycin  Will order tessalon perles   Follow up:  Follow up if needed

## 2020-06-11 ENCOUNTER — Encounter (HOSPITAL_COMMUNITY): Payer: Self-pay

## 2020-06-11 ENCOUNTER — Emergency Department (HOSPITAL_COMMUNITY)
Admission: EM | Admit: 2020-06-11 | Discharge: 2020-06-12 | Disposition: A | Discharge status: Home / Self Care | Payer: Self-pay | Attending: Emergency Medicine | Admitting: Emergency Medicine

## 2020-06-11 ENCOUNTER — Other Ambulatory Visit: Payer: Self-pay

## 2020-06-11 DIAGNOSIS — K0889 Other specified disorders of teeth and supporting structures: Secondary | ICD-10-CM | POA: Insufficient documentation

## 2020-06-11 DIAGNOSIS — Z91010 Allergy to peanuts: Secondary | ICD-10-CM | POA: Insufficient documentation

## 2020-06-11 NOTE — ED Triage Notes (Signed)
Pt to ED from home with c/o dental pain. Pt has chipped tooth located lower left. Pt has taken tylenol and ibuprofen with no relief.

## 2020-06-12 MED ORDER — CLINDAMYCIN HCL 300 MG PO CAPS: 300.0000 mg | ORAL_CAPSULE | Freq: Four times a day (QID) | ORAL | 0 refills | Status: DC

## 2020-06-12 MED ORDER — CLINDAMYCIN HCL 150 MG PO CAPS
300.0000 mg | ORAL_CAPSULE | Freq: Once | ORAL | Status: AC
  Administered 2020-06-12: 300 mg via ORAL
  Filled 2020-06-12: qty 2

## 2020-06-12 MED ORDER — KETOROLAC TROMETHAMINE 60 MG/2ML IM SOLN
30.0000 mg | Freq: Once | INTRAMUSCULAR | Status: AC
  Administered 2020-06-12: 30 mg via INTRAMUSCULAR
  Filled 2020-06-12: qty 2

## 2020-06-12 MED ORDER — OXYCODONE-ACETAMINOPHEN 5-325 MG PO TABS
1.0000 | ORAL_TABLET | Freq: Once | ORAL | Status: AC
  Administered 2020-06-12: 1 via ORAL
  Filled 2020-06-12: qty 1

## 2020-06-12 NOTE — ED Provider Notes (Signed)
[facility] EMERGENCY DEPARTMENT Provider Note   CSN: [id number] Arrival date & time: 06/11/20  2139     History Chief Complaint  Patient presents with  . Dental Pain    [name] is a 35 y.o. female.  The history is provided by the patient.  Dental Pain Location:  Lower Lower teeth location:  21/LL 1st bicuspid Quality:  Sharp Severity:  Mild Timing:  Constant Chronicity:  Recurrent Previous work-up:  Filled cavity Relieved by:  None tried Worsened by:  Nothing Ineffective treatments:  None tried Associated symptoms: no congestion, no fever, no headaches and no neck pain        Past Medical History:  Diagnosis Date  . No pertinent past medical history     Patient Active Problem List   Diagnosis Date Noted  . S/P cesarean section 05/19/2012    Past Surgical History:  Procedure Laterality Date  . CESAREAN SECTION N/A 05/19/2012   Procedure: Primary Cesarean Section;  Surgeon: Lavina Hamman, MD;  Location: [facility];  Service: Obstetrics;  Laterality: N/A;  Primary Cesarean Section  . FOOT SURGERY  as child   arch corrections     OB History    Gravida  3   Para  3   Term  3   Preterm  0   AB  0   Living  2     SAB  0   IAB  0   Ectopic  0   Multiple  0   Live Births              No family history on file.  Social History   Tobacco Use  . Smoking status: Never Smoker  . Smokeless tobacco: Never Used  Vaping Use  . Vaping Use: Never used  Substance Use Topics  . Alcohol use: No  . Drug use: No    Home Medications Prior to Admission medications   Medication Sig Start Date End Date Taking? Authorizing Provider  clindamycin (CLEOCIN) 300 MG capsule Take 1 capsule (300 mg total) by mouth 4 (four) times daily. X 7 days 06/12/20  Yes Standley Bargo, Barbara Cower, MD  azithromycin (ZITHROMAX) 250 MG tablet Take 2 tablets (500 mg) on day 1, then take 1 tablet (250 mg) on days 2-5 04/22/20   Ivonne Andrew, NP  benzonatate (TESSALON) 100 MG  capsule Take 1 capsule (100 mg total) by mouth 2 (two) times daily as needed for cough. 04/22/20   Ivonne Andrew, NP  famotidine (PEPCID) 20 MG tablet Take 1 tablet (20 mg total) by mouth 2 (two) times daily. 09/28/16   Devoria Albe, MD  HYDROcodone-acetaminophen (NORCO/VICODIN) 5-325 MG tablet Take 1-2 tablets by mouth every 4 (four) hours as needed. Take with food 10/16/17   [name], PA-C  ibuprofen (ADVIL) 800 MG tablet Take 1 tablet (800 mg total) by mouth 3 (three) times daily. Take with food 06/01/19   Triplett, Tammy, PA-C  levonorgestrel (MIRENA, 52 MG,) 20 MCG/24HR IUD 1 Intra Uterine Device by Intrauterine route once. 07/03/12   [provider]  trimethoprim-polymyxin b (POLYTRIM) ophthalmic solution Place 1 drop into the right eye every 4 (four) hours. 11/07/19   Arthor Captain, PA-C    Allergies    Bee venom, Mango flavor, Peanut-containing drug products, and Penicillins  Review of Systems   Review of Systems  Constitutional: Negative for fever.  HENT: Negative for congestion.   Musculoskeletal: Negative for neck pain.  Neurological: Negative for headaches.  All other  systems reviewed and are negative.   Physical Exam Updated Vital Signs BP 130/78 (BP Location: Right Arm)   Pulse 80   Temp 98.7 F (37.1 C) (Oral)   Resp 17   Ht 5\' 4"  (1.626 m)   Wt 81.6 kg   SpO2 100%   BMI 30.90 kg/m   Physical Exam Vitals and nursing note reviewed.  Constitutional:      Appearance: She is well-developed and well-nourished.  HENT:     Head: Normocephalic and atraumatic.     Nose: Nose normal. No congestion or rhinorrhea.     Mouth/Throat:     Mouth: Mucous membranes are moist.     Pharynx: Oropharynx is clear.      Comments: Tenderness and mild edema around this tooth Eyes:     Pupils: Pupils are equal, round, and reactive to light.  Cardiovascular:     Rate and Rhythm: Normal rate and regular rhythm.  Pulmonary:     Effort: No respiratory distress.      Breath sounds: No stridor.  Abdominal:     General: Abdomen is flat. There is no distension.  Musculoskeletal:        General: No swelling or tenderness. Normal range of motion.     Cervical back: Normal range of motion.  Skin:    General: Skin is warm and dry.     Coloration: Skin is not jaundiced or pale.  Neurological:     General: No focal deficit present.     Mental Status: She is alert.     ED Results / Procedures / Treatments   Labs (all labs ordered are listed, but only abnormal results are displayed) Labs Reviewed - No data to display  EKG None  Radiology No results found.  Procedures Dental Block  Date/Time: 06/12/2020 4:37 AM Performed by: 08/12/2020, MD Authorized by: Marily Memos, MD   Consent:    Consent obtained:  Verbal   Consent given by:  Patient   Risks, benefits, and alternatives were discussed: yes     Risks discussed:  Allergic reaction, hematoma, intravascular injection, pain, infection, unsuccessful block, swelling and nerve damage   Alternatives discussed:  No treatment, delayed treatment, referral and alternative treatment Universal protocol:    Procedure explained and questions answered to patient or proxy's satisfaction: yes     Relevant documents present and verified: yes   Indications:    Indications: dental pain   Location:    Block type:  Supraperiosteal   Supraperiosteal location:  Lower teeth   Lower teeth location:  21/LL 1st bicuspid Procedure details:    Syringe type:  Luer lock syringe   Needle gauge:  27 G   Anesthetic injected:  Bupivacaine 0.25% WITH epi   Injection procedure:  Anatomic landmarks identified, introduced needle, incremental injection, negative aspiration for blood and anatomic landmarks palpated Post-procedure details:    Outcome:  Anesthesia achieved   Procedure completion:  Tolerated     Medications Ordered in ED Medications  ketorolac (TORADOL) injection 30 mg (30 mg Intramuscular Given 06/12/20  0108)  oxyCODONE-acetaminophen (PERCOCET/ROXICET) 5-325 MG per tablet 1 tablet (1 tablet Oral Given 06/12/20 0107)  clindamycin (CLEOCIN) capsule 300 mg (300 mg Oral Given 06/12/20 0107)    ED Course  I have reviewed the triage vital signs and the nursing notes.  Pertinent labs & imaging results that were available during my care of the patient were reviewed by me and considered in my medical decision making (see chart for details).  MDM Rules/Calculators/A&P                          Dental pain. Possibly infected tooth. Dental block with improvement. abx started. Recommended dental follow up.   Final Clinical Impression(s) / ED Diagnoses Final diagnoses:  Pain, dental    Rx / DC Orders ED Discharge Orders         Ordered    clindamycin (CLEOCIN) 300 MG capsule  4 times daily        06/12/20 0048           Sidda Humm, Barbara Cower, MD 06/12/20 [id number]

## 2021-05-06 ENCOUNTER — Emergency Department (HOSPITAL_COMMUNITY)
Admission: EM | Admit: 2021-05-06 | Discharge: 2021-05-06 | Disposition: A | Discharge status: Home / Self Care | Payer: No Typology Code available for payment source | Attending: Emergency Medicine | Admitting: Emergency Medicine

## 2021-05-06 ENCOUNTER — Other Ambulatory Visit: Payer: Self-pay

## 2021-05-06 ENCOUNTER — Encounter (HOSPITAL_COMMUNITY): Payer: Self-pay | Admitting: Emergency Medicine

## 2021-05-06 DIAGNOSIS — Z91010 Allergy to peanuts: Secondary | ICD-10-CM | POA: Diagnosis not present

## 2021-05-06 DIAGNOSIS — Z23 Encounter for immunization: Secondary | ICD-10-CM | POA: Insufficient documentation

## 2021-05-06 DIAGNOSIS — S61233A Puncture wound without foreign body of left middle finger without damage to nail, initial encounter: Secondary | ICD-10-CM | POA: Insufficient documentation

## 2021-05-06 DIAGNOSIS — W25XXXA Contact with sharp glass, initial encounter: Secondary | ICD-10-CM | POA: Diagnosis not present

## 2021-05-06 DIAGNOSIS — Y990 Civilian activity done for income or pay: Secondary | ICD-10-CM | POA: Diagnosis not present

## 2021-05-06 DIAGNOSIS — S61239A Puncture wound without foreign body of unspecified finger without damage to nail, initial encounter: Secondary | ICD-10-CM

## 2021-05-06 MED ORDER — TETANUS-DIPHTH-ACELL PERTUSSIS 5-2.5-18.5 LF-MCG/0.5 IM SUSY
0.5000 mL | PREFILLED_SYRINGE | Freq: Once | INTRAMUSCULAR | Status: AC
  Administered 2021-05-06: 0.5 mL via INTRAMUSCULAR
  Filled 2021-05-06: qty 0.5

## 2021-05-06 NOTE — ED Provider Notes (Signed)
[facility] Provider Note   CSN: [id number] Arrival date & time: 05/06/21  1116     History  Chief Complaint  Patient presents with   Finger Injury    [name] is a 36 y.o. female.  HPI  Patient presents to the ED for evaluation after an injury while at work.  Patient had a meat thermometer fall and accidentally stabbed herself at the base of her left middle finger.  Patient was able to remove the thermometer intact.  She is not exactly sure how far it went in.  Patient now has pain with movement and she has noticed some bruising and swelling.  She denies any numbness or weakness distally  Home Medications Prior to Admission medications   Medication Sig Start Date End Date Taking? Authorizing Provider  escitalopram (LEXAPRO) 20 MG tablet Take 20 mg by mouth daily.   Yes [provider]  azithromycin (ZITHROMAX) 250 MG tablet Take 2 tablets (500 mg) on day 1, then take 1 tablet (250 mg) on days 2-5 04/22/20   Fenton Foy, NP  benzonatate (TESSALON) 100 MG capsule Take 1 capsule (100 mg total) by mouth 2 (two) times daily as needed for cough. 04/22/20   Fenton Foy, NP  clindamycin (CLEOCIN) 300 MG capsule Take 1 capsule (300 mg total) by mouth 4 (four) times daily. X 7 days 06/12/20   Mesner, Corene Cornea, MD  famotidine (PEPCID) 20 MG tablet Take 1 tablet (20 mg total) by mouth 2 (two) times daily. 09/28/16   Rolland Porter, MD  HYDROcodone-acetaminophen (NORCO/VICODIN) 5-325 MG tablet Take 1-2 tablets by mouth every 4 (four) hours as needed. Take with food 10/16/17   Lily Kocher, PA-C  ibuprofen (ADVIL) 800 MG tablet Take 1 tablet (800 mg total) by mouth 3 (three) times daily. Take with food 06/01/19   Triplett, Tammy, PA-C  levonorgestrel (MIRENA, 52 MG,) 20 MCG/24HR IUD 1 Intra Uterine Device by Intrauterine route once. 07/03/12   [provider]  trimethoprim-polymyxin b (POLYTRIM) ophthalmic solution Place 1 drop into the right eye every 4  (four) hours. 11/07/19   Margarita Mail, PA-C      Allergies    Bee venom, Mango flavor, Peanut-containing drug products, and Penicillins    Review of Systems   Review of Systems  Constitutional:  Negative for fever.   Physical Exam Updated Vital Signs BP 131/78    Pulse 75    Temp 97.9 F (36.6 C)    Resp 16    Ht 1.626 m (5\' 4" )    Wt 85.3 kg    SpO2 100%    BMI 32.27 kg/m  Physical Exam Vitals and nursing note reviewed.  Constitutional:      General: She is not in acute distress.    Appearance: She is well-developed.  HENT:     Head: Normocephalic and atraumatic.     Right Ear: External ear normal.     Left Ear: External ear normal.  Eyes:     General: No scleral icterus.       Right eye: No discharge.        Left eye: No discharge.     Conjunctiva/sclera: Conjunctivae normal.  Neck:     Trachea: No tracheal deviation.  Cardiovascular:     Rate and Rhythm: Normal rate.  Pulmonary:     Effort: Pulmonary effort is normal. No respiratory distress.     Breath sounds: No stridor.  Abdominal:     General: There  is no distension.  Musculoskeletal:        General: Swelling present. No deformity.     Cervical back: Neck supple.     Comments: Mild tenderness palpation at the volar aspect at the base of the left middle finger near the metacarpal phalangeal joint, small puncture wound noted on the ulnar aspect, no bleeding, full range of motion of the finger, 5-5 FDP FDS and extensor digitorum, normal cap refill  Skin:    General: Skin is warm and dry.     Findings: No rash.  Neurological:     Mental Status: She is alert.     Cranial Nerves: Cranial nerve deficit: no gross deficits.    ED Results / Procedures / Treatments   Labs (all labs ordered are listed, but only abnormal results are displayed) Labs Reviewed - No data to display  EKG None  Radiology No results found.  Procedures Procedures    Medications Ordered in ED Medications  Tdap (BOOSTRIX)  injection 0.5 mL (0.5 mLs Intramuscular Given 05/06/21 1216)    ED Course/ Medical Decision Making/ A&P                           Medical Decision Making Risk Prescription drug management.   Small puncture wound noted.  No evidence of neurovascular dysfunction.  No signs of severe tendon injury.  Will update the patient's tetanus.  Local wound care provided.  Work note provided  Evaluation and diagnostic testing in the emergency department does not suggest an emergent condition requiring admission or immediate intervention beyond what has been performed at this time.  The patient is safe for discharge and has been instructed to return immediately for worsening symptoms, change in symptoms or any other concerns.         Final Clinical Impression(s) / ED Diagnoses Final diagnoses:  Puncture wound of finger, initial encounter    Rx / DC Orders ED Discharge Orders     None         Dorie Rank, MD 05/06/21 1220

## 2021-05-06 NOTE — ED Notes (Signed)
Pt d/c home per MD order, Discharge summary reviewed, verbalize understanding. Ambulatory off unit. No s/s of acute distress noted at discharge.  °

## 2021-05-06 NOTE — ED Triage Notes (Signed)
Pt was accidentally stabbed with meat thermometer at work in left middle finger. Pt still able to move finger but it has decreased ROM, with minimal swelling and bruising noted.

## 2022-02-14 ENCOUNTER — Ambulatory Visit (INDEPENDENT_AMBULATORY_CARE_PROVIDER_SITE_OTHER): Payer: Self-pay | Admitting: Family Medicine

## 2022-02-14 ENCOUNTER — Encounter: Payer: Self-pay | Admitting: Family Medicine

## 2022-02-14 VITALS — BP 121/75 | HR 80 | Ht 64.0 in | Wt 199.0 lb

## 2022-02-14 DIAGNOSIS — M79605 Pain in left leg: Secondary | ICD-10-CM

## 2022-02-14 DIAGNOSIS — E038 Other specified hypothyroidism: Secondary | ICD-10-CM

## 2022-02-14 DIAGNOSIS — Z1159 Encounter for screening for other viral diseases: Secondary | ICD-10-CM

## 2022-02-14 DIAGNOSIS — M545 Low back pain, unspecified: Secondary | ICD-10-CM | POA: Insufficient documentation

## 2022-02-14 DIAGNOSIS — R7301 Impaired fasting glucose: Secondary | ICD-10-CM

## 2022-02-14 DIAGNOSIS — E7849 Other hyperlipidemia: Secondary | ICD-10-CM

## 2022-02-14 DIAGNOSIS — Z114 Encounter for screening for human immunodeficiency virus [HIV]: Secondary | ICD-10-CM

## 2022-02-14 DIAGNOSIS — E559 Vitamin D deficiency, unspecified: Secondary | ICD-10-CM

## 2022-02-14 MED ORDER — NAPROXEN 500 MG PO TABS: 500.0000 mg | ORAL_TABLET | Freq: Two times a day (BID) | ORAL | 0 refills | Status: DC | PRN

## 2022-02-14 NOTE — Assessment & Plan Note (Signed)
Encouraged to take naproxen 500 mg twice daily as needed for lumbar pain Encouraged use of heating pads at the affected site for pain relief If conservative measurements fail, will refer the patient to physical therapy

## 2022-02-14 NOTE — Patient Instructions (Addendum)
I appreciate the opportunity to provide care to you today!    Follow up:  1 week for pap exam  Labs: please stop by the lab today to get your blood drawn (CBC, CMP, TSH, Lipid profile, HgA1c, Vit D)  Screening: HIV and Hep C  Please pick up your prescription at the pharmacy     Please continue to a heart-healthy diet and increase your physical activities. Try to exercise for at least three times a week.      It was a pleasure to see you and I look forward to continuing to work together on your health and well-being. Please do not hesitate to call the office if you need care or have questions about your care.   Have a wonderful day and week. With Gratitude, Gilmore Laroche MSN, FNP-BC

## 2022-02-14 NOTE — Progress Notes (Signed)
New Patient Office Visit  Subjective:  Patient ID: [name], female    DOB: [birth year]  Age: 36 y.o. MRN: [id number]  CC:  Chief Complaint  Patient presents with   Establish Care    New patient. Reports back pain , shoots down her leg, since 2015 from giving birth. Would like to be screened for diabetes since she has family hx.  Would like to come back for a pap smear.     HPI [name] is a 36 y.o. female with past medical history of cesarean section presents for establishing care.  Lumbar pain with left-sided sciatica: She reports low back pain with sciatica since her cesarean section in 2015.  She describes the pain as burning and sharp and rates the pain 7-8 out of 10.  She reports that the pain is  limiting, and  aggravated with ambulation.  She denies bowel or bladder incontinence, lower extremity weakness and  fever.  Past Medical History:  Diagnosis Date   No pertinent past medical history     Past Surgical History:  Procedure Laterality Date   CESAREAN SECTION N/A 05/19/2012   Procedure: Primary Cesarean Section;  Surgeon: Teresa Fowler, MD;  Location: [facility];  Service: Obstetrics;  Laterality: N/A;  Primary Cesarean Section   FOOT SURGERY  as child   [facility]    Family History  Problem Relation Age of Onset   Diabetes Mellitus II Mother    Diabetes Father    Diabetes Sister    Diabetes Maternal Grandmother    Diabetes Maternal Grandfather    Diabetes Paternal Grandmother     Social History   Socioeconomic History   Marital status: Significant Other    Spouse name: Not on file   Number of children: Not on file   Years of education: Not on file   Highest education level: Not on file  Occupational History   Not on file  Tobacco Use   Smoking status: Never   Smokeless tobacco: Never  Vaping Use   Vaping Use: Never used  Substance and Sexual Activity   Alcohol use: No   Drug use: Yes    Types: Marijuana   Sexual activity: Yes   Other Topics Concern   Not on file  Social History Narrative   Not on file   Social Determinants of Health   Financial Resource Strain: Not on file  Food Insecurity: Not on file  Transportation Needs: Not on file  Physical Activity: Not on file  Stress: Not on file  Social Connections: Not on file  Intimate Partner Violence: Not on file    ROS Review of Systems  Constitutional:  Negative for fatigue and fever.  HENT:  Negative for congestion and rhinorrhea.   Respiratory:  Negative for chest tightness and shortness of breath.   Cardiovascular:  Negative for chest pain and palpitations.  Gastrointestinal:  Negative for nausea and vomiting.  Endocrine: Negative for polydipsia, polyphagia and polyuria.  Genitourinary:  Negative for frequency and urgency.  Musculoskeletal:  Positive for back pain.  Skin:  Negative for rash and wound.  Neurological:  Negative for dizziness and headaches.  Psychiatric/Behavioral:  Negative for self-injury, sleep disturbance and suicidal ideas.     Objective:   Today's Vitals: BP 121/75   Pulse 80   Ht _0  (1.626 m)   Wt 199 lb 0.6 oz (90.3 kg)   LMP 01/29/2022   SpO2 95%   BMI 34.17 kg/m   Physical Exam  HENT:     Head: Normocephalic.     Right Ear: External ear normal.     Left Ear: External ear normal.     Nose: No congestion.     Mouth/Throat:     Mouth: Mucous membranes are moist.  Eyes:     Extraocular Movements: Extraocular movements intact.     Pupils: Pupils are equal, round, and reactive to light.  Cardiovascular:     Rate and Rhythm: Normal rate and regular rhythm.     Pulses: Normal pulses.  Pulmonary:     Effort: Pulmonary effort is normal.     Breath sounds: Normal breath sounds. No wheezing.  Abdominal:     Tenderness: There is no right CVA tenderness or left CVA tenderness.  Musculoskeletal:     Cervical back: No rigidity.     Lumbar back: Tenderness present.     Right lower leg: No edema.     Left lower  leg: No edema.     Comments: Negative CVA tenderness Tenderness with palpation at the left lumbar musculature Range of motion is intact  Skin:    Findings: No lesion.  Neurological:     Mental Status: She is alert and oriented to person, place, and time.  Psychiatric:     Comments: Normal affect     Assessment & Plan:   Problem List Items Addressed This Visit       Other   Lumbar pain with radiation down left leg - Primary    Encouraged to take naproxen 500 mg twice daily as needed for lumbar pain Encouraged use of heating pads at the affected site for pain relief If conservative measurements fail, will refer the patient to physical therapy      Relevant Medications   naproxen (NAPROSYN) 500 MG tablet   Other Visit Diagnoses     IFG (impaired fasting glucose)       Relevant Orders   Hemoglobin A1c   Vitamin D deficiency       Relevant Orders   VITAMIN D 25 Hydroxy (Vit-D Deficiency, Fractures)   Need for hepatitis C screening test       Relevant Orders   Hepatitis C antibody   Encounter for screening for HIV       Relevant Orders   HIV Antibody (routine testing w rflx)   Other specified hypothyroidism       Relevant Orders   TSH + free T4   Other hyperlipidemia       Relevant Orders   Lipid panel   CMP14+EGFR   CBC with Differential/Platelet       Outpatient Encounter Medications as of 02/14/2022  Medication Sig   naproxen (NAPROSYN) 500 MG tablet Take 1 tablet (500 mg total) by mouth 2 (two) times daily as needed.   famotidine (PEPCID) 20 MG tablet Take 1 tablet (20 mg total) by mouth 2 (two) times daily.   HYDROcodone-acetaminophen (NORCO/VICODIN) 5-325 MG tablet Take 1-2 tablets by mouth every 4 (four) hours as needed. Take with food   ibuprofen (ADVIL) 800 MG tablet Take 1 tablet (800 mg total) by mouth 3 (three) times daily. Take with food   levonorgestrel (MIRENA, 52 MG,) 20 MCG/24HR IUD 1 Intra Uterine Device by Intrauterine route once.    [DISCONTINUED] azithromycin (ZITHROMAX) 250 MG tablet Take 2 tablets (500 mg) on day 1, then take 1 tablet (250 mg) on days 2-5   [DISCONTINUED] benzonatate (TESSALON) 100 MG capsule Take 1 capsule (100 mg total) by mouth  2 (two) times daily as needed for cough.   [DISCONTINUED] clindamycin (CLEOCIN) 300 MG capsule Take 1 capsule (300 mg total) by mouth 4 (four) times daily. X 7 days   [DISCONTINUED] escitalopram (LEXAPRO) 20 MG tablet Take 20 mg by mouth daily.   [DISCONTINUED] trimethoprim-polymyxin b (POLYTRIM) ophthalmic solution Place 1 drop into the right eye every 4 (four) hours.   No facility-administered encounter medications on file as of 02/14/2022.    Follow-up: Return in about 1 week (around 02/21/2022) for pap smear.   Alvira Monday, FNP

## 2022-02-15 ENCOUNTER — Other Ambulatory Visit: Payer: Self-pay | Admitting: Family Medicine

## 2022-02-15 DIAGNOSIS — E559 Vitamin D deficiency, unspecified: Secondary | ICD-10-CM

## 2022-02-15 LAB — CMP14+EGFR
ALT: 16 IU/L (ref 0–32)
AST: 18 IU/L (ref 0–40)
Albumin/Globulin Ratio: 1.8 (ref 1.2–2.2)
Albumin: 4.5 g/dL (ref 3.9–4.9)
Alkaline Phosphatase: 62 IU/L (ref 44–121)
BUN/Creatinine Ratio: 11 (ref 9–23)
BUN: 8 mg/dL (ref 6–20)
Bilirubin Total: 0.3 mg/dL (ref 0.0–1.2)
CO2: 24 mmol/L (ref 20–29)
Calcium: 9.6 mg/dL (ref 8.7–10.2)
Chloride: 103 mmol/L (ref 96–106)
Creatinine, Ser: 0.74 mg/dL (ref 0.57–1.00)
Globulin, Total: 2.5 g/dL (ref 1.5–4.5)
Glucose: 79 mg/dL (ref 70–99)
Potassium: 4.4 mmol/L (ref 3.5–5.2)
Sodium: 140 mmol/L (ref 134–144)
Total Protein: 7 g/dL (ref 6.0–8.5)
eGFR: 107 mL/min/{1.73_m2} (ref >59)

## 2022-02-15 LAB — LIPID PANEL
Chol/HDL Ratio: 5.4 ratio — ABNORMAL HIGH (ref 0.0–4.4)
Cholesterol, Total: 215 mg/dL — ABNORMAL HIGH (ref 100–199)
HDL: 40 mg/dL (ref >39)
LDL Chol Calc (NIH): 159 mg/dL — ABNORMAL HIGH (ref 0–99)
Triglycerides: 87 mg/dL (ref 0–149)
VLDL Cholesterol Cal: 16 mg/dL (ref 5–40)

## 2022-02-15 LAB — CBC WITH DIFFERENTIAL/PLATELET
Basophils Absolute: 0 10*3/uL (ref 0.0–0.2)
Basos: 1 %
EOS (ABSOLUTE): 0.1 10*3/uL (ref 0.0–0.4)
Eos: 3 %
Hematocrit: 42.2 % (ref 34.0–46.6)
Hemoglobin: 14.2 g/dL (ref 11.1–15.9)
Immature Grans (Abs): 0 10*3/uL (ref 0.0–0.1)
Immature Granulocytes: 0 %
Lymphocytes Absolute: 2.1 10*3/uL (ref 0.7–3.1)
Lymphs: 54 %
MCH: 31.8 pg (ref 26.6–33.0)
MCHC: 33.6 g/dL (ref 31.5–35.7)
MCV: 94 fL (ref 79–97)
Monocytes Absolute: 0.4 10*3/uL (ref 0.1–0.9)
Monocytes: 9 %
Neutrophils Absolute: 1.3 10*3/uL — ABNORMAL LOW (ref 1.4–7.0)
Neutrophils: 33 %
Platelets: 304 10*3/uL (ref 150–450)
RBC: 4.47 x10E6/uL (ref 3.77–5.28)
RDW: 12.1 % (ref 11.7–15.4)
WBC: 3.9 10*3/uL (ref 3.4–10.8)

## 2022-02-15 LAB — TSH+FREE T4
Free T4: 1.08 ng/dL (ref 0.82–1.77)
TSH: 1.06 u[IU]/mL (ref 0.450–4.500)

## 2022-02-15 LAB — HEMOGLOBIN A1C
Est. average glucose Bld gHb Est-mCnc: 111 mg/dL
Hgb A1c MFr Bld: 5.5 % (ref 4.8–5.6)

## 2022-02-15 LAB — HEPATITIS C ANTIBODY: Hep C Virus Ab: NONREACTIVE

## 2022-02-15 LAB — HIV ANTIBODY (ROUTINE TESTING W REFLEX): HIV Screen 4th Generation wRfx: NONREACTIVE

## 2022-02-15 LAB — VITAMIN D 25 HYDROXY (VIT D DEFICIENCY, FRACTURES): Vit D, 25-Hydroxy: 11.4 ng/mL — ABNORMAL LOW (ref 30.0–100.0)

## 2022-02-15 MED ORDER — VITAMIN D (ERGOCALCIFEROL) 1.25 MG (50000 UNIT) PO CAPS: 50000.0000 [IU] | ORAL_CAPSULE | ORAL | 2 refills | Status: DC

## 2022-02-26 ENCOUNTER — Ambulatory Visit: Payer: Medicaid Other | Admitting: Family Medicine

## 2022-03-09 DIAGNOSIS — Z419 Encounter for procedure for purposes other than remedying health state, unspecified: Secondary | ICD-10-CM | POA: Diagnosis not present

## 2022-03-21 ENCOUNTER — Other Ambulatory Visit (HOSPITAL_COMMUNITY)
Admission: RE | Admit: 2022-03-21 | Discharge: 2022-03-21 | Disposition: A | Discharge status: Home / Self Care | Payer: Medicaid Other | Source: Ambulatory Visit | Attending: Family Medicine | Admitting: Family Medicine

## 2022-03-21 ENCOUNTER — Encounter: Payer: Self-pay | Admitting: Family Medicine

## 2022-03-21 ENCOUNTER — Ambulatory Visit (INDEPENDENT_AMBULATORY_CARE_PROVIDER_SITE_OTHER): Payer: Medicaid Other | Admitting: Family Medicine

## 2022-03-21 VITALS — BP 113/70 | HR 87 | Ht 64.0 in | Wt 204.0 lb

## 2022-03-21 DIAGNOSIS — Z124 Encounter for screening for malignant neoplasm of cervix: Secondary | ICD-10-CM | POA: Diagnosis not present

## 2022-03-21 NOTE — Progress Notes (Signed)
Established Patient Office Visit  Subjective:  Patient ID: [name], female    DOB: Sep 17, 1985  Age: 36 y.o. MRN: [id number]  CC:  Chief Complaint  Patient presents with   Gynecologic Exam    Pap today.    HPI [name] is a 36 y.o. female with past medical history of lumbar pain with radiation down left leg presents for Pap examination. For the details of today's visit, please refer to the assessment and plan.     Past Medical History:  Diagnosis Date   No pertinent past medical history     Past Surgical History:  Procedure Laterality Date   CESAREAN SECTION N/A 05/19/2012   Procedure: Primary Cesarean Section;  Surgeon: Cheri Fowler, MD;  Location: [facility];  Service: Obstetrics;  Laterality: N/A;  Primary Cesarean Section   FOOT SURGERY  as child   [employer]    Family History  Problem Relation Age of Onset   Diabetes Mellitus II Mother    Diabetes Father    Diabetes Sister    Diabetes Maternal Grandmother    Diabetes Maternal Grandfather    Diabetes Paternal Grandmother     Social History   Socioeconomic History   Marital status: Significant Other    Spouse name: Not on file   Number of children: Not on file   Years of education: Not on file   Highest education level: Not on file  Occupational History   Not on file  Tobacco Use   Smoking status: Never   Smokeless tobacco: Never  Vaping Use   Vaping Use: Never used  Substance and Sexual Activity   Alcohol use: No   Drug use: Yes    Types: Marijuana   Sexual activity: Yes  Other Topics Concern   Not on file  Social History Narrative   Not on file   Social Determinants of Health   Financial Resource Strain: Not on file  Food Insecurity: Not on file  Transportation Needs: Not on file  Physical Activity: Not on file  Stress: Not on file  Social Connections: Not on file  Intimate Partner Violence: Not on file    Outpatient Medications Prior to Visit  Medication Sig  Dispense Refill   naproxen (NAPROSYN) 500 MG tablet Take 1 tablet (500 mg total) by mouth 2 (two) times daily as needed. 30 tablet 0   Vitamin D, Ergocalciferol, (DRISDOL) 1.25 MG (50000 UNIT) CAPS capsule Take 1 capsule (50,000 Units total) by mouth every 7 (seven) days. 5 capsule 2   famotidine (PEPCID) 20 MG tablet Take 1 tablet (20 mg total) by mouth 2 (two) times daily. 18 tablet 0   HYDROcodone-acetaminophen (NORCO/VICODIN) 5-325 MG tablet Take 1-2 tablets by mouth every 4 (four) hours as needed. Take with food 12 tablet 0   ibuprofen (ADVIL) 800 MG tablet Take 1 tablet (800 mg total) by mouth 3 (three) times daily. Take with food 21 tablet 0   levonorgestrel (MIRENA, 52 MG,) 20 MCG/24HR IUD 1 Intra Uterine Device by Intrauterine route once.     No facility-administered medications prior to visit.    Allergies  Allergen Reactions   Bee Venom Hives, Shortness Of Breath, Swelling and Rash   Mango Flavor Hives, Itching and Rash   Peanut-Containing Drug Products Hives, Itching and Rash   Penicillins Rash    Has patient had a PCN reaction causing immediate rash, facial/tongue/throat swelling, SOB or lightheadedness with hypotension: No Has patient had a PCN reaction causing severe  rash involving mucus membranes or skin necrosis: No Has patient had a PCN reaction that required hospitalization: No Has patient had a PCN reaction occurring within the last 10 years: No If all of the above answers are "NO", then may proceed with Cephalosporin use.     ROS Review of Systems  Constitutional:  Negative for chills and fever.  Eyes:  Negative for visual disturbance.  Respiratory:  Negative for chest tightness and shortness of breath.   Genitourinary:  Negative for vaginal discharge and vaginal pain.  Neurological:  Negative for dizziness and headaches.      Objective:    Physical Exam HENT:     Head: Normocephalic.     Mouth/Throat:     Mouth: Mucous membranes are moist.   Cardiovascular:     Rate and Rhythm: Normal rate.     Heart sounds: Normal heart sounds.  Pulmonary:     Effort: Pulmonary effort is normal.     Breath sounds: Normal breath sounds.  Genitourinary:    Exam position: Lithotomy position.     Tanner stage (genital): 5.     Labia:        Right: No rash, tenderness, lesion or injury.        Left: No rash, tenderness, lesion or injury.      Comments: Vaginal wall: pink and rugated, smooth and non-tender; absence of lesions, edema, and erythema. Labia Majora and Minora: present bilaterally, moist, soft tissue, and homogeneous; free of edema and ulcerations. Clitoris is anatomically present, above the urethral, and free of lesions, masses, and ulceration.   Neurological:     Mental Status: She is alert.     BP 113/70   Pulse 87   Ht _0  (1.626 m)   Wt 204 lb 0.6 oz (92.6 kg)   LMP 02/28/2022   SpO2 97%   BMI 35.02 kg/m  Wt Readings from Last 3 Encounters:  03/21/22 204 lb 0.6 oz (92.6 kg)  02/14/22 199 lb 0.6 oz (90.3 kg)  05/06/21 188 lb (85.3 kg)    Lab Results  Component Value Date   TSH 1.060 02/14/2022   Lab Results  Component Value Date   WBC 3.9 02/14/2022   HGB 14.2 02/14/2022   HCT 42.2 02/14/2022   MCV 94 02/14/2022   PLT 304 02/14/2022   Lab Results  Component Value Date   NA 140 02/14/2022   K 4.4 02/14/2022   CO2 24 02/14/2022   GLUCOSE 79 02/14/2022   BUN 8 02/14/2022   CREATININE 0.74 02/14/2022   BILITOT 0.3 02/14/2022   ALKPHOS 62 02/14/2022   AST 18 02/14/2022   ALT 16 02/14/2022   PROT 7.0 02/14/2022   ALBUMIN 4.5 02/14/2022   CALCIUM 9.6 02/14/2022   ANIONGAP 7 04/21/2018   EGFR 107 02/14/2022   Lab Results  Component Value Date   CHOL 215 (H) 02/14/2022   Lab Results  Component Value Date   HDL 40 02/14/2022   Lab Results  Component Value Date   LDLCALC 159 (H) 02/14/2022   Lab Results  Component Value Date   TRIG 87 02/14/2022   Lab Results  Component Value Date    CHOLHDL 5.4 (H) 02/14/2022   Lab Results  Component Value Date   HGBA1C 5.5 02/14/2022      Assessment & Plan:  Pap smear for cervical cancer screening Assessment & Plan: - According to the [employer], cytology and HPV co-testing (preferred) every 5 years or cytology alone (acceptable)  every 3 years. - Pap due 2026    Cervical cancer screening -     Cytology - PAP    Follow-up: Return in about 3 months (around 06/20/2022).   Alvira Monday, FNP

## 2022-03-21 NOTE — Patient Instructions (Addendum)
I appreciate the opportunity to provide care to you today!    Follow up:  3 months  You will be contacted with the results of your Pap examination on MyChart    Please continue to a heart-healthy diet and increase your physical activities. Try to exercise for at least three times a week.      It was a pleasure to see you and I look forward to continuing to work together on your health and well-being. Please do not hesitate to call the office if you need care or have questions about your care.   Have a wonderful day and week. With Gratitude, Gilmore Laroche MSN, FNP-BC

## 2022-03-21 NOTE — Assessment & Plan Note (Signed)
-   According to the [employer], cytology and HPV co-testing (preferred) every 5 years or cytology alone (acceptable) every 3 years. - Pap due 2026  

## 2022-03-27 LAB — CYTOLOGY - PAP
Comment: NEGATIVE
Diagnosis: NEGATIVE
High risk HPV: NEGATIVE

## 2022-03-27 NOTE — Progress Notes (Signed)
Please inform the patient that her pap with HPV was negative

## 2022-03-30 ENCOUNTER — Telehealth: Payer: Medicaid Other | Admitting: Physician Assistant

## 2022-03-30 DIAGNOSIS — R6889 Other general symptoms and signs: Secondary | ICD-10-CM

## 2022-03-30 MED ORDER — BENZONATATE 100 MG PO CAPS: 100.0000 mg | ORAL_CAPSULE | Freq: Three times a day (TID) | ORAL | 0 refills | Status: DC | PRN

## 2022-03-30 MED ORDER — FLUTICASONE PROPIONATE 50 MCG/ACT NA SUSP: 2.0000 | Freq: Every day | NASAL | 0 refills | Status: DC

## 2022-03-30 MED ORDER — NAPROXEN 500 MG PO TABS: 500.0000 mg | ORAL_TABLET | Freq: Two times a day (BID) | ORAL | 0 refills | Status: DC

## 2022-03-30 MED ORDER — OSELTAMIVIR PHOSPHATE 75 MG PO CAPS: 75.0000 mg | ORAL_CAPSULE | Freq: Two times a day (BID) | ORAL | 0 refills | Status: DC

## 2022-03-30 MED ORDER — PROMETHAZINE-DM 6.25-15 MG/5ML PO SYRP: 5.0000 mL | ORAL_SOLUTION | Freq: Four times a day (QID) | ORAL | 0 refills | Status: DC | PRN

## 2022-03-30 NOTE — Progress Notes (Signed)
Virtual Visit Consent   [name], you are scheduled for a virtual visit with a [facility] provider today. Just as with appointments in the office, your consent must be obtained to participate. Your consent will be active for this visit and any virtual visit you may have with one of our providers in the next 365 days. If you have a MyChart account, a copy of this consent can be sent to you electronically.  As this is a virtual visit, video technology does not allow for your provider to perform a traditional examination. This may limit your provider's ability to fully assess your condition. If your provider identifies any concerns that need to be evaluated in person or the need to arrange testing (such as labs, EKG, etc.), we will make arrangements to do so. Although advances in technology are sophisticated, we cannot ensure that it will always work on either your end or our end. If the connection with a video visit is poor, the visit may have to be switched to a telephone visit. With either a video or telephone visit, we are not always able to ensure that we have a secure connection.  By engaging in this virtual visit, you consent to the provision of healthcare and authorize for your insurance to be billed (if applicable) for the services provided during this visit. Depending on your insurance coverage, you may receive a charge related to this service.  I need to obtain your verbal consent now. Are you willing to proceed with your visit today? [name] has provided verbal consent on 03/30/2022 for a virtual visit (video or telephone). Mar Daring, PA-C  Date: 03/30/2022 6:59 PM  Virtual Visit via Video Note   I, Mar Daring, connected with  [name]  ([id number], May 21, 1985) on 03/30/22 at  6:45 PM EST by a video-enabled telemedicine application and verified that I am speaking with the correct person using two identifiers.  Location: Patient: Virtual Visit  Location Patient: Home Provider: Virtual Visit Location Provider: Home Office   I discussed the limitations of evaluation and management by telemedicine and the availability of in person appointments. The patient expressed understanding and agreed to proceed.    History of Present Illness: [name] is a 36 y.o. who identifies as a female who was assigned female at birth, and is being seen today for URI symptoms.  HPI: URI  This is a new problem. The current episode started yesterday. The problem has been gradually worsening. The maximum temperature recorded prior to her arrival was 101 - 101.9 F (101.8). The fever has been present for 1 to 2 days. Associated symptoms include congestion, coughing, headaches, rhinorrhea, sinus pain, sneezing and a sore throat (mild). Pertinent negatives include no diarrhea, ear pain, nausea, plugged ear sensation or vomiting. Associated symptoms comments: chills. Treatments tried: dayquil, nyquil. The treatment provided no relief.   Negative at home covid 19 testing.   Problems:  Patient Active Problem List   Diagnosis Date Noted   Pap smear for cervical cancer screening 03/21/2022   Lumbar pain with radiation down left leg 02/14/2022   S/P cesarean section 05/19/2012    Allergies:  Allergies  Allergen Reactions   Bee Venom Hives, Shortness Of Breath, Swelling and Rash   Mango Flavor Hives, Itching and Rash   Peanut-Containing Drug Products Hives, Itching and Rash   Penicillins Rash    Has patient had a PCN reaction causing immediate rash, facial/tongue/throat swelling, SOB or lightheadedness with  hypotension: No Has patient had a PCN reaction causing severe rash involving mucus membranes or skin necrosis: No Has patient had a PCN reaction that required hospitalization: No Has patient had a PCN reaction occurring within the last 10 years: No If all of the above answers are "NO", then may proceed with Cephalosporin use.    Medications:   Current Outpatient Medications:    benzonatate (TESSALON) 100 MG capsule, Take 1 capsule (100 mg total) by mouth 3 (three) times daily as needed., Disp: 30 capsule, Rfl: 0   fluticasone (FLONASE) 50 MCG/ACT nasal spray, Place 2 sprays into both nostrils daily., Disp: 16 g, Rfl: 0   naproxen (NAPROSYN) 500 MG tablet, Take 1 tablet (500 mg total) by mouth 2 (two) times daily with a meal., Disp: 30 tablet, Rfl: 0   oseltamivir (TAMIFLU) 75 MG capsule, Take 1 capsule (75 mg total) by mouth 2 (two) times daily., Disp: 10 capsule, Rfl: 0   promethazine-dextromethorphan (PROMETHAZINE-DM) 6.25-15 MG/5ML syrup, Take 5 mLs by mouth 4 (four) times daily as needed., Disp: 118 mL, Rfl: 0   famotidine (PEPCID) 20 MG tablet, Take 1 tablet (20 mg total) by mouth 2 (two) times daily., Disp: 18 tablet, Rfl: 0   HYDROcodone-acetaminophen (NORCO/VICODIN) 5-325 MG tablet, Take 1-2 tablets by mouth every 4 (four) hours as needed. Take with food, Disp: 12 tablet, Rfl: 0   levonorgestrel (MIRENA, 52 MG,) 20 MCG/24HR IUD, 1 Intra Uterine Device by Intrauterine route once., Disp: , Rfl:    Vitamin D, Ergocalciferol, (DRISDOL) 1.25 MG (50000 UNIT) CAPS capsule, Take 1 capsule (50,000 Units total) by mouth every 7 (seven) days., Disp: 5 capsule, Rfl: 2  Observations/Objective: Patient is well-developed, well-nourished in no acute distress.  Resting comfortably at home.  Head is normocephalic, atraumatic.  No labored breathing.  Speech is clear and coherent with logical content.  Patient is alert and oriented at baseline.  Cough is deep and harsh   Assessment and Plan: 1. Flu-like symptoms - oseltamivir (TAMIFLU) 75 MG capsule; Take 1 capsule (75 mg total) by mouth 2 (two) times daily.  Dispense: 10 capsule; Refill: 0 - promethazine-dextromethorphan (PROMETHAZINE-DM) 6.25-15 MG/5ML syrup; Take 5 mLs by mouth 4 (four) times daily as needed.  Dispense: 118 mL; Refill: 0 - benzonatate (TESSALON) 100 MG capsule; Take 1  capsule (100 mg total) by mouth 3 (three) times daily as needed.  Dispense: 30 capsule; Refill: 0 - naproxen (NAPROSYN) 500 MG tablet; Take 1 tablet (500 mg total) by mouth 2 (two) times daily with a meal.  Dispense: 30 tablet; Refill: 0 - fluticasone (FLONASE) 50 MCG/ACT nasal spray; Place 2 sprays into both nostrils daily.  Dispense: 16 g; Refill: 0  - Suspected viral URI with negative covid testing - Possible flu as flu A and B are prevalent in the community at this time - Limited testing availability that would delay appropriate treatment waiting on results - Tamiflu prescribed for possible flu - Work note provided - Push fluids - Symptomatic management OTC of choice as needed - Seek in person evaluation if symptoms worsen or fail to improve   Follow Up Instructions: I discussed the assessment and treatment plan with the patient. The patient was provided an opportunity to ask questions and all were answered. The patient agreed with the plan and demonstrated an understanding of the instructions.  A copy of instructions were sent to the patient via MyChart unless otherwise noted below.    The patient was advised to call back or seek  an in-person evaluation if the symptoms worsen or if the condition fails to improve as anticipated.  Time:  I spent 10 minutes with the patient via telehealth technology discussing the above problems/concerns.    Margaretann Loveless, PA-C

## 2022-03-30 NOTE — Patient Instructions (Signed)
[name], thank you for joining Margaretann Loveless, PA-C for today's virtual visit.  While this provider is not your primary care provider (PCP), if your PCP is located in our provider database this encounter information will be shared with them immediately following your visit.   A [facility] MyChart account gives you access to today's visit and all your visits, tests, and labs performed at [facility] " click here if you don't have a [facility] MyChart account or go to [email or web address]  Consent: (Patient) [name] provided verbal consent for this virtual visit at the beginning of the encounter.  Current Medications:  Current Outpatient Medications:    benzonatate (TESSALON) 100 MG capsule, Take 1 capsule (100 mg total) by mouth 3 (three) times daily as needed., Disp: 30 capsule, Rfl: 0   fluticasone (FLONASE) 50 MCG/ACT nasal spray, Place 2 sprays into both nostrils daily., Disp: 16 g, Rfl: 0   naproxen (NAPROSYN) 500 MG tablet, Take 1 tablet (500 mg total) by mouth 2 (two) times daily with a meal., Disp: 30 tablet, Rfl: 0   oseltamivir (TAMIFLU) 75 MG capsule, Take 1 capsule (75 mg total) by mouth 2 (two) times daily., Disp: 10 capsule, Rfl: 0   promethazine-dextromethorphan (PROMETHAZINE-DM) 6.25-15 MG/5ML syrup, Take 5 mLs by mouth 4 (four) times daily as needed., Disp: 118 mL, Rfl: 0   famotidine (PEPCID) 20 MG tablet, Take 1 tablet (20 mg total) by mouth 2 (two) times daily., Disp: 18 tablet, Rfl: 0   HYDROcodone-acetaminophen (NORCO/VICODIN) 5-325 MG tablet, Take 1-2 tablets by mouth every 4 (four) hours as needed. Take with food, Disp: 12 tablet, Rfl: 0   levonorgestrel (MIRENA, 52 MG,) 20 MCG/24HR IUD, 1 Intra Uterine Device by Intrauterine route once., Disp: , Rfl:    Vitamin D, Ergocalciferol, (DRISDOL) 1.25 MG (50000 UNIT) CAPS capsule, Take 1 capsule (50,000 Units total) by mouth every 7 (seven) days., Disp: 5 capsule, Rfl: 2   Medications  ordered in this encounter:  Meds ordered this encounter  Medications   oseltamivir (TAMIFLU) 75 MG capsule    Sig: Take 1 capsule (75 mg total) by mouth 2 (two) times daily.    Dispense:  10 capsule    Refill:  0    Order Specific Question:   Supervising Provider    Answer:   Merrilee Jansky [id number]   promethazine-dextromethorphan (PROMETHAZINE-DM) 6.25-15 MG/5ML syrup    Sig: Take 5 mLs by mouth 4 (four) times daily as needed.    Dispense:  118 mL    Refill:  0    Order Specific Question:   Supervising Provider    Answer:   Merrilee Jansky [[id number]]   benzonatate (TESSALON) 100 MG capsule    Sig: Take 1 capsule (100 mg total) by mouth 3 (three) times daily as needed.    Dispense:  30 capsule    Refill:  0    Order Specific Question:   Supervising Provider    Answer:   Merrilee Jansky [[id number]]   naproxen (NAPROSYN) 500 MG tablet    Sig: Take 1 tablet (500 mg total) by mouth 2 (two) times daily with a meal.    Dispense:  30 tablet    Refill:  0    Order Specific Question:   Supervising Provider    Answer:   Merrilee Jansky [[id number]]   fluticasone (FLONASE) 50 MCG/ACT nasal spray    Sig: Place 2 sprays into both nostrils daily.    Dispense:  16 g    Refill:  0    Order Specific Question:   Supervising Provider    Answer:   Merrilee Jansky [[id number]]     *If you need refills on other medications prior to your next appointment, please contact your pharmacy*  Follow-Up: Call back or seek an in-person evaluation if the symptoms worsen or if the condition fails to improve as anticipated.  [facility] [phone]  Other Instructions  Influenza, Adult Influenza, also called "the flu," is a viral infection that mainly affects the respiratory tract. This includes the lungs, nose, and throat. The flu spreads easily from person to person (is contagious). It causes common cold symptoms, along with high fever and body aches. What are the causes? This  condition is caused by the influenza virus. You can get the virus by: Breathing in droplets that are in the air from an infected person's cough or sneeze. Touching something that has the virus on it (has been contaminated) and then touching your mouth, nose, or eyes. What increases the risk? The following factors may make you more likely to get the flu: Not washing or sanitizing your hands often. Having close contact with many people during cold and flu season. Touching your mouth, eyes, or nose without first washing or sanitizing your hands. Not getting an annual flu shot. You may have a higher risk for the flu, including serious problems, such as a lung infection (pneumonia), if you: Are older than 65. Are pregnant. Have a weakened disease-fighting system (immune system). This includes people who have HIV or AIDS, are on chemotherapy, or are taking medicines that reduce (suppress) the immune system. Have a long-term (chronic) illness, such as heart disease, kidney disease, diabetes, or lung disease. Have a liver disorder. Are severely overweight (morbidly obese). Have anemia. Have asthma. What are the signs or symptoms? Symptoms of this condition usually begin suddenly and last 4-14 days. These may include: Fever and chills. Headaches, body aches, or muscle aches. Sore throat. Cough. Runny or stuffy (congested) nose. Chest discomfort. Poor appetite. Weakness or fatigue. Dizziness. Nausea or vomiting. How is this diagnosed? This condition may be diagnosed based on: Your symptoms and medical history. A physical exam. Swabbing your nose or throat and testing the fluid for the influenza virus. How is this treated? If the flu is diagnosed early, you can be treated with antiviral medicine that is given by mouth (orally) or through an IV. This can help reduce how severe the illness is and how long it lasts. Taking care of yourself at home can help relieve symptoms. Your health care  provider may recommend: Taking over-the-counter medicines. Drinking plenty of fluids. In many cases, the flu goes away on its own. If you have severe symptoms or complications, you may be treated in a hospital. Follow these instructions at home: Activity Rest as needed and get plenty of sleep. Stay home from work or school as told by your health care provider. Unless you are visiting your health care provider, avoid leaving home until your fever has been gone for 24 hours without taking medicine. Eating and drinking Take an oral rehydration solution (ORS). This is a drink that is sold at pharmacies and retail stores. Drink enough fluid to keep your urine pale yellow. Drink clear fluids in small amounts as you are able. Clear fluids include water, ice chips, fruit juice mixed with water, and low-calorie sports drinks. Eat bland, easy-to-digest foods in small amounts as you are able.  These foods include bananas, applesauce, rice, lean meats, toast, and crackers. Avoid drinking fluids that contain a lot of sugar or caffeine, such as energy drinks, regular sports drinks, and soda. Avoid alcohol. Avoid spicy or fatty foods. General instructions     Take over-the-counter and prescription medicines only as told by your health care provider. Use a cool mist humidifier to add humidity to the air in your home. This can make it easier to breathe. When using a cool mist humidifier, clean it daily. Empty the water and replace it with clean water. Cover your mouth and nose when you cough or sneeze. Wash your hands with soap and water often and for at least 20 seconds, especially after you cough or sneeze. If soap and water are not available, use alcohol-based hand sanitizer. Keep all follow-up visits. This is important. How is this prevented?  Get an annual flu shot. This is usually available in late summer, fall, or winter. Ask your health care provider when you should get your flu shot. Avoid  contact with people who are sick during cold and flu season. This is generally fall and winter. Contact a health care provider if: You develop new symptoms. You have: Chest pain. Diarrhea. A fever. Your cough gets worse. You produce more mucus. You feel nauseous or you vomit. Get help right away if you: Develop shortness of breath or have difficulty breathing. Have skin or nails that turn a bluish color. Have severe pain or stiffness in your neck. Develop a sudden headache or sudden pain in your face or ear. Cannot eat or drink without vomiting. These symptoms may represent a serious problem that is an emergency. Do not wait to see if the symptoms will go away. Get medical help right away. Call your local emergency services (911 in the [place] Do not drive yourself to the hospital. Summary Influenza, also called "the flu," is a viral infection that primarily affects your respiratory tract. Symptoms of the flu usually begin suddenly and last 4-14 days. Getting an annual flu shot is the best way to prevent getting the flu. Stay home from work or school as told by your health care provider. Unless you are visiting your health care provider, avoid leaving home until your fever has been gone for 24 hours without taking medicine. Keep all follow-up visits. This is important. This information is not intended to replace advice given to you by your health care provider. Make sure you discuss any questions you have with your health care provider. Document Revised: 11/13/2019 Document Reviewed: 11/13/2019 Elsevier Patient Education  2023 [employer]    If you have been instructed to have an in-person evaluation today at a local Urgent Care facility, please use the link below. It will take you to a list of all of our available [facility], including address, phone number and hours of operation. Please do not delay care.  [facility]  If you or a family member do not  have a primary care provider, use the link below to schedule a visit and establish care. When you choose a [facility] primary care physician or advanced practice provider, you gain a long-term partner in health. Find a Primary Care Provider  Learn more about 's in-office and virtual care options:  - Get Care Now

## 2022-04-06 ENCOUNTER — Other Ambulatory Visit: Payer: Self-pay

## 2022-04-09 DIAGNOSIS — Z419 Encounter for procedure for purposes other than remedying health state, unspecified: Secondary | ICD-10-CM | POA: Diagnosis not present

## 2022-05-10 DIAGNOSIS — Z419 Encounter for procedure for purposes other than remedying health state, unspecified: Secondary | ICD-10-CM | POA: Diagnosis not present

## 2022-06-08 ENCOUNTER — Other Ambulatory Visit: Payer: Self-pay | Admitting: Family Medicine

## 2022-06-08 DIAGNOSIS — E559 Vitamin D deficiency, unspecified: Secondary | ICD-10-CM

## 2022-06-08 DIAGNOSIS — Z419 Encounter for procedure for purposes other than remedying health state, unspecified: Secondary | ICD-10-CM | POA: Diagnosis not present

## 2022-06-17 ENCOUNTER — Telehealth: Payer: Medicaid Other | Admitting: Family

## 2022-06-17 DIAGNOSIS — M5442 Lumbago with sciatica, left side: Secondary | ICD-10-CM

## 2022-06-17 MED ORDER — NAPROXEN 500 MG PO TABS: 500.0000 mg | ORAL_TABLET | Freq: Two times a day (BID) | ORAL | 0 refills | Status: DC

## 2022-06-17 MED ORDER — PREDNISONE 10 MG (21) PO TBPK: ORAL_TABLET | ORAL | 0 refills | Status: DC

## 2022-06-17 MED ORDER — BACLOFEN 10 MG PO TABS: 10.0000 mg | ORAL_TABLET | Freq: Three times a day (TID) | ORAL | 0 refills | Status: DC

## 2022-06-17 NOTE — Progress Notes (Signed)

## 2022-06-20 ENCOUNTER — Ambulatory Visit (INDEPENDENT_AMBULATORY_CARE_PROVIDER_SITE_OTHER): Payer: Medicaid Other | Admitting: Family Medicine

## 2022-06-20 ENCOUNTER — Encounter: Payer: Self-pay | Admitting: Family Medicine

## 2022-06-20 VITALS — BP 135/81 | HR 91 | Ht 64.0 in | Wt 203.0 lb

## 2022-06-20 DIAGNOSIS — E038 Other specified hypothyroidism: Secondary | ICD-10-CM

## 2022-06-20 DIAGNOSIS — R7301 Impaired fasting glucose: Secondary | ICD-10-CM | POA: Diagnosis not present

## 2022-06-20 DIAGNOSIS — E7849 Other hyperlipidemia: Secondary | ICD-10-CM

## 2022-06-20 DIAGNOSIS — M79605 Pain in left leg: Secondary | ICD-10-CM | POA: Diagnosis not present

## 2022-06-20 DIAGNOSIS — E559 Vitamin D deficiency, unspecified: Secondary | ICD-10-CM | POA: Diagnosis not present

## 2022-06-20 DIAGNOSIS — M545 Low back pain, unspecified: Secondary | ICD-10-CM

## 2022-06-20 NOTE — Assessment & Plan Note (Signed)
She recently had a E-Visit visit on 06/17/2022 for lumbar pain with left-sided sciatica She was treated with prednisone taper 10 mg, baclofen 10 mg 3 times daily as needed naproxen 500 mg twice daily She has 2 days left on her prednisone She reports relief of her symptoms with the treatment regimen given No complaints or concerns today No red flag symptoms reported

## 2022-06-20 NOTE — Progress Notes (Signed)
Established Patient Office Visit  Subjective:  Patient ID: [name], female    DOB: 06-09-1985  Age: 37 y.o. MRN: [id number]  CC:  Chief Complaint  Patient presents with   Follow-up    3 month f/u, pt reports evisit due to back pain received medication has been helping.     HPI [name] is a 37 y.o. female with past medical history of lumbar pain with left-sided sciatica presents for f/u. For the details of today's visit, please refer to the assessment and plan.     Past Medical History:  Diagnosis Date   No pertinent past medical history     Past Surgical History:  Procedure Laterality Date   CESAREAN SECTION N/A 05/19/2012   Procedure: Primary Cesarean Section;  Surgeon: Cheri Fowler, MD;  Location: [facility];  Service: Obstetrics;  Laterality: N/A;  Primary Cesarean Section   FOOT SURGERY  as child   arch corrections    Family History  Problem Relation Age of Onset   Diabetes Mellitus II Mother    Diabetes Father    Diabetes Sister    Diabetes Maternal Grandmother    Diabetes Maternal Grandfather    Diabetes Paternal Grandmother     Social History   Socioeconomic History   Marital status: Significant Other    Spouse name: Not on file   Number of children: Not on file   Years of education: Not on file   Highest education level: Not on file  Occupational History   Not on file  Tobacco Use   Smoking status: Never   Smokeless tobacco: Never  Vaping Use   Vaping Use: Never used  Substance and Sexual Activity   Alcohol use: No   Drug use: Yes    Types: Marijuana   Sexual activity: Yes  Other Topics Concern   Not on file  Social History Narrative   Not on file   Social Determinants of Health   Financial Resource Strain: Not on file  Food Insecurity: Not on file  Transportation Needs: Not on file  Physical Activity: Not on file  Stress: Not on file  Social Connections: Not on file  Intimate Partner Violence: Not on file     Outpatient Medications Prior to Visit  Medication Sig Dispense Refill   baclofen (LIORESAL) 10 MG tablet Take 1 tablet (10 mg total) by mouth 3 (three) times daily. 30 each 0   fluticasone (FLONASE) 50 MCG/ACT nasal spray Place 2 sprays into both nostrils daily. 16 g 0   naproxen (NAPROSYN) 500 MG tablet Take 1 tablet (500 mg total) by mouth 2 (two) times daily with a meal. 30 tablet 0   predniSONE (STERAPRED UNI-PAK 21 TAB) 10 MG (21) TBPK tablet Use as directed 21 tablet 0   Vitamin D, Ergocalciferol, (DRISDOL) 1.25 MG (50000 UNIT) CAPS capsule TAKE 1 CAPSULE BY MOUTH EVERY 7 DAYS 5 capsule 2   benzonatate (TESSALON) 100 MG capsule Take 1 capsule (100 mg total) by mouth 3 (three) times daily as needed. 30 capsule 0   famotidine (PEPCID) 20 MG tablet Take 1 tablet (20 mg total) by mouth 2 (two) times daily. 18 tablet 0   HYDROcodone-acetaminophen (NORCO/VICODIN) 5-325 MG tablet Take 1-2 tablets by mouth every 4 (four) hours as needed. Take with food 12 tablet 0   levonorgestrel (MIRENA, 52 MG,) 20 MCG/24HR IUD 1 Intra Uterine Device by Intrauterine route once.     oseltamivir (TAMIFLU) 75 MG capsule Take 1 capsule (75  mg total) by mouth 2 (two) times daily. 10 capsule 0   promethazine-dextromethorphan (PROMETHAZINE-DM) 6.25-15 MG/5ML syrup Take 5 mLs by mouth 4 (four) times daily as needed. 118 mL 0   No facility-administered medications prior to visit.    Allergies  Allergen Reactions   Bee Venom Hives, Shortness Of Breath, Swelling and Rash   Mango Flavor Hives, Itching and Rash   Peanut-Containing Drug Products Hives, Itching and Rash   Penicillins Rash    Has patient had a PCN reaction causing immediate rash, facial/tongue/throat swelling, SOB or lightheadedness with hypotension: No Has patient had a PCN reaction causing severe rash involving mucus membranes or skin necrosis: No Has patient had a PCN reaction that required hospitalization: No Has patient had a PCN reaction  occurring within the last 10 years: No If all of the above answers are "NO", then may proceed with Cephalosporin use.     ROS Review of Systems  Constitutional:  Negative for chills and fever.  Eyes:  Negative for visual disturbance.  Respiratory:  Negative for chest tightness and shortness of breath.   Neurological:  Negative for dizziness and headaches.      Objective:    Physical Exam HENT:     Head: Normocephalic.     Mouth/Throat:     Mouth: Mucous membranes are moist.  Cardiovascular:     Rate and Rhythm: Normal rate.     Heart sounds: Normal heart sounds.  Pulmonary:     Effort: Pulmonary effort is normal.     Breath sounds: Normal breath sounds.  Neurological:     Mental Status: She is alert.     BP 135/81   Pulse 91   Ht '5\' 4"'$  (1.626 m)   Wt 203 lb (92.1 kg)   SpO2 98%   BMI 34.84 kg/m  Wt Readings from Last 3 Encounters:  06/20/22 203 lb (92.1 kg)  03/21/22 204 lb 0.6 oz (92.6 kg)  02/14/22 199 lb 0.6 oz (90.3 kg)    Lab Results  Component Value Date   TSH 1.060 02/14/2022   Lab Results  Component Value Date   WBC 3.9 02/14/2022   HGB 14.2 02/14/2022   HCT 42.2 02/14/2022   MCV 94 02/14/2022   PLT 304 02/14/2022   Lab Results  Component Value Date   NA 140 02/14/2022   K 4.4 02/14/2022   CO2 24 02/14/2022   GLUCOSE 79 02/14/2022   BUN 8 02/14/2022   CREATININE 0.74 02/14/2022   BILITOT 0.3 02/14/2022   ALKPHOS 62 02/14/2022   AST 18 02/14/2022   ALT 16 02/14/2022   PROT 7.0 02/14/2022   ALBUMIN 4.5 02/14/2022   CALCIUM 9.6 02/14/2022   ANIONGAP 7 04/21/2018   EGFR 107 02/14/2022   Lab Results  Component Value Date   CHOL 215 (H) 02/14/2022   Lab Results  Component Value Date   HDL 40 02/14/2022   Lab Results  Component Value Date   LDLCALC 159 (H) 02/14/2022   Lab Results  Component Value Date   TRIG 87 02/14/2022   Lab Results  Component Value Date   CHOLHDL 5.4 (H) 02/14/2022   Lab Results  Component Value  Date   HGBA1C 5.5 02/14/2022      Assessment & Plan:  Lumbar pain with radiation down left leg Assessment & Plan: She recently had a E-Visit visit on 06/17/2022 for lumbar pain with left-sided sciatica She was treated with prednisone taper 10 mg, baclofen 10 mg 3 times daily  as needed naproxen 500 mg twice daily She has 2 days left on her prednisone She reports relief of her symptoms with the treatment regimen given No complaints or concerns today No red flag symptoms reported    IFG (impaired fasting glucose) -     Hemoglobin A1c  Vitamin D deficiency -     VITAMIN D 25 Hydroxy (Vit-D Deficiency, Fractures)  Other specified hypothyroidism -     TSH + free T4  Other hyperlipidemia -     Lipid panel -     CMP14+EGFR -     CBC with Differential/Platelet    Follow-up: Return in about 6 months (around 12/21/2022).   Alvira Monday, FNP

## 2022-06-20 NOTE — Patient Instructions (Signed)
I appreciate the opportunity to provide care to you today!    Follow up:  6 months  Labs: please stop by the lab during the week to get your blood drawn (CBC, CMP, TSH, Lipid profile, HgA1c, Vit D)     Please continue to a heart-healthy diet and increase your physical activities. Try to exercise for 67mns at least five days a week.      It was a pleasure to see you and I look forward to continuing to work together on your health and well-being. Please do not hesitate to call the office if you need care or have questions about your care.   Have a wonderful day and week. With Gratitude, GAlvira MondayMSN, FNP-BC

## 2022-06-21 DIAGNOSIS — E038 Other specified hypothyroidism: Secondary | ICD-10-CM | POA: Diagnosis not present

## 2022-06-21 DIAGNOSIS — E7849 Other hyperlipidemia: Secondary | ICD-10-CM | POA: Diagnosis not present

## 2022-06-21 DIAGNOSIS — R7301 Impaired fasting glucose: Secondary | ICD-10-CM | POA: Diagnosis not present

## 2022-06-21 DIAGNOSIS — E559 Vitamin D deficiency, unspecified: Secondary | ICD-10-CM | POA: Diagnosis not present

## 2022-06-22 LAB — CBC WITH DIFFERENTIAL/PLATELET
Basophils Absolute: 0 10*3/uL (ref 0.0–0.2)
Basos: 0 %
EOS (ABSOLUTE): 0.1 10*3/uL (ref 0.0–0.4)
Eos: 1 %
Hematocrit: 42.2 % (ref 34.0–46.6)
Hemoglobin: 14.1 g/dL (ref 11.1–15.9)
Immature Grans (Abs): 0 10*3/uL (ref 0.0–0.1)
Immature Granulocytes: 0 %
Lymphocytes Absolute: 3.5 10*3/uL — ABNORMAL HIGH (ref 0.7–3.1)
Lymphs: 38 %
MCH: 31.8 pg (ref 26.6–33.0)
MCHC: 33.4 g/dL (ref 31.5–35.7)
MCV: 95 fL (ref 79–97)
Monocytes Absolute: 0.7 10*3/uL (ref 0.1–0.9)
Monocytes: 7 %
Neutrophils Absolute: 4.9 10*3/uL (ref 1.4–7.0)
Neutrophils: 54 %
Platelets: 355 10*3/uL (ref 150–450)
RBC: 4.43 x10E6/uL (ref 3.77–5.28)
RDW: 13.1 % (ref 11.7–15.4)
WBC: 9.2 10*3/uL (ref 3.4–10.8)

## 2022-06-22 LAB — CMP14+EGFR
ALT: 18 IU/L (ref 0–32)
AST: 16 IU/L (ref 0–40)
Albumin/Globulin Ratio: 1.7 (ref 1.2–2.2)
Albumin: 4.3 g/dL (ref 3.9–4.9)
Alkaline Phosphatase: 63 IU/L (ref 44–121)
BUN/Creatinine Ratio: 8 — ABNORMAL LOW (ref 9–23)
BUN: 6 mg/dL (ref 6–20)
Bilirubin Total: 0.2 mg/dL (ref 0.0–1.2)
CO2: 27 mmol/L (ref 20–29)
Calcium: 9.7 mg/dL (ref 8.7–10.2)
Chloride: 100 mmol/L (ref 96–106)
Creatinine, Ser: 0.73 mg/dL (ref 0.57–1.00)
Globulin, Total: 2.6 g/dL (ref 1.5–4.5)
Glucose: 83 mg/dL (ref 70–99)
Potassium: 3.8 mmol/L (ref 3.5–5.2)
Sodium: 140 mmol/L (ref 134–144)
Total Protein: 6.9 g/dL (ref 6.0–8.5)
eGFR: 109 mL/min/{1.73_m2} (ref >59)

## 2022-06-22 LAB — LIPID PANEL
Chol/HDL Ratio: 4 ratio (ref 0.0–4.4)
Cholesterol, Total: 182 mg/dL (ref 100–199)
HDL: 46 mg/dL (ref >39)
LDL Chol Calc (NIH): 113 mg/dL — ABNORMAL HIGH (ref 0–99)
Triglycerides: 127 mg/dL (ref 0–149)
VLDL Cholesterol Cal: 23 mg/dL (ref 5–40)

## 2022-06-22 LAB — HEMOGLOBIN A1C
Est. average glucose Bld gHb Est-mCnc: 114 mg/dL
Hgb A1c MFr Bld: 5.6 % (ref 4.8–5.6)

## 2022-06-22 LAB — VITAMIN D 25 HYDROXY (VIT D DEFICIENCY, FRACTURES): Vit D, 25-Hydroxy: 35.2 ng/mL (ref 30.0–100.0)

## 2022-06-22 LAB — TSH+FREE T4
Free T4: 1.09 ng/dL (ref 0.82–1.77)
TSH: 1.16 u[IU]/mL (ref 0.450–4.500)

## 2022-07-09 DIAGNOSIS — Z419 Encounter for procedure for purposes other than remedying health state, unspecified: Secondary | ICD-10-CM | POA: Diagnosis not present

## 2022-07-10 ENCOUNTER — Other Ambulatory Visit: Payer: Self-pay | Admitting: Family

## 2022-07-10 ENCOUNTER — Encounter: Payer: Self-pay | Admitting: Family Medicine

## 2022-07-10 ENCOUNTER — Other Ambulatory Visit: Payer: Self-pay

## 2022-07-10 DIAGNOSIS — M545 Low back pain, unspecified: Secondary | ICD-10-CM

## 2022-07-10 MED ORDER — BACLOFEN 10 MG PO TABS
10.0000 mg | ORAL_TABLET | Freq: Three times a day (TID) | ORAL | 0 refills | Status: DC
Start: 2022-07-10 — End: 2022-09-13

## 2022-07-10 MED ORDER — NAPROXEN 500 MG PO TABS
500.0000 mg | ORAL_TABLET | Freq: Two times a day (BID) | ORAL | 0 refills | Status: DC
Start: 2022-07-10 — End: 2022-09-13

## 2022-07-11 ENCOUNTER — Other Ambulatory Visit: Payer: Self-pay | Admitting: Family Medicine

## 2022-07-11 DIAGNOSIS — M545 Low back pain, unspecified: Secondary | ICD-10-CM

## 2022-07-20 ENCOUNTER — Other Ambulatory Visit: Payer: Self-pay

## 2022-08-08 DIAGNOSIS — Z419 Encounter for procedure for purposes other than remedying health state, unspecified: Secondary | ICD-10-CM | POA: Diagnosis not present

## 2022-09-01 ENCOUNTER — Telehealth: Payer: Medicaid Other | Admitting: Family

## 2022-09-01 DIAGNOSIS — R1030 Lower abdominal pain, unspecified: Secondary | ICD-10-CM | POA: Diagnosis not present

## 2022-09-01 NOTE — Progress Notes (Signed)
Virtual Visit Consent   RAMYAH Hicks, you are scheduled for a virtual visit with a [facility] provider today. Just as with appointments in the office, your consent must be obtained to participate. Your consent will be active for this visit and any virtual visit you may have with one of our providers in the next 365 days. If you have a MyChart account, a copy of this consent can be sent to you electronically.  As this is a virtual visit, video technology does not allow for your provider to perform a traditional examination. This may limit your provider's ability to fully assess your condition. If your provider identifies any concerns that need to be evaluated in person or the need to arrange testing (such as labs, EKG, etc.), we will make arrangements to do so. Although advances in technology are sophisticated, we cannot ensure that it will always work on either your end or our end. If the connection with a video visit is poor, the visit may have to be switched to a telephone visit. With either a video or telephone visit, we are not always able to ensure that we have a secure connection.  By engaging in this virtual visit, you consent to the provision of healthcare and authorize for your insurance to be billed (if applicable) for the services provided during this visit. Depending on your insurance coverage, you may receive a charge related to this service.  I need to obtain your verbal consent now. Are you willing to proceed with your visit today? [name] has provided verbal consent on 09/01/2022 for a virtual visit (video or telephone). Teresa Rodney, FNP  Date: 09/01/2022 9:44 AM  Virtual Visit via Video Note   I, [name], connected with  [name]  ([id number], June 23, 1985) on 09/01/22 at  9:30 AM EDT by a video-enabled telemedicine application and verified that I am speaking with the correct person using two identifiers.  Location: Patient: Virtual Visit Location Patient:  Home Provider: Virtual Visit Location Provider: Home Office   I discussed the limitations of evaluation and management by telemedicine and the availability of in person appointments. The patient expressed understanding and agreed to proceed.    History of Present Illness: [name] is a 37 y.o. who identifies as a female who was assigned female at birth, and is being seen today for abdominal pain that started last night. Reports mild lower abdominal camping that is coming and going. Reports her last menstrual cycle completed yesterday. Denies any fever, vomiting, diarrhea, or constipation.   HPI: Abdominal Pain This is a new problem. The current episode started yesterday. The problem occurs intermittently. The pain is located in the periumbilical region. The pain is at a severity of 7/10. The pain is mild. The quality of the pain is cramping. Associated symptoms include flatus and nausea. Pertinent negatives include no belching, constipation, diarrhea, dysuria, fever, frequency, hematuria or vomiting. Nothing aggravates the pain. Treatments tried: naprosyn. The treatment provided no relief.    Problems:  Patient Active Problem List   Diagnosis Date Noted   Pap smear for cervical cancer screening 03/21/2022   Lumbar pain with radiation down left leg 02/14/2022   S/P cesarean section 05/19/2012    Allergies:  Allergies  Allergen Reactions   Bee Venom Hives, Shortness Of Breath, Swelling and Rash   Mango Flavor Hives, Itching and Rash   Peanut-Containing Drug Products Hives, Itching and Rash   Penicillins Rash    Has patient had a  PCN reaction causing immediate rash, facial/tongue/throat swelling, SOB or lightheadedness with hypotension: No Has patient had a PCN reaction causing severe rash involving mucus membranes or skin necrosis: No Has patient had a PCN reaction that required hospitalization: No Has patient had a PCN reaction occurring within the last 10 years: No If all of the  above answers are "NO", then may proceed with Cephalosporin use.    Medications:  Current Outpatient Medications:    baclofen (LIORESAL) 10 MG tablet, Take 1 tablet (10 mg total) by mouth 3 (three) times daily., Disp: 30 each, Rfl: 0   fluticasone (FLONASE) 50 MCG/ACT nasal spray, Place 2 sprays into both nostrils daily., Disp: 16 g, Rfl: 0   naproxen (NAPROSYN) 500 MG tablet, Take 1 tablet (500 mg total) by mouth 2 (two) times daily with a meal., Disp: 30 tablet, Rfl: 0   Vitamin D, Ergocalciferol, (DRISDOL) 1.25 MG (50000 UNIT) CAPS capsule, TAKE 1 CAPSULE BY MOUTH EVERY 7 DAYS, Disp: 5 capsule, Rfl: 2  Observations/Objective: Patient is well-developed, well-nourished in no acute distress.  Resting comfortably  at home.  Head is normocephalic, atraumatic.  No labored breathing.  Speech is clear and coherent with logical content.  Patient is alert and oriented at baseline.  No acute distress, mild lower abdominal pain with pushing  Assessment and Plan: 1. Lower abdominal pain  Discussed given generalized pain will need to be seen in person. Pain has improved with naprosyn, so recommend waiting a few hours and if pain resolves can follow up with PCP as there is no acute distress.  Discussed red flags to go to ED Work note given    Follow Up Instructions: I discussed the assessment and treatment plan with the patient. The patient was provided an opportunity to ask questions and all were answered. The patient agreed with the plan and demonstrated an understanding of the instructions.  A copy of instructions were sent to the patient via MyChart unless otherwise noted below.     The patient was advised to call back or seek an in-person evaluation if the symptoms worsen or if the condition fails to improve as anticipated.  Time:  I spent 12 minutes with the patient via telehealth technology discussing the above problems/concerns.    [place], [place]

## 2022-09-08 DIAGNOSIS — Z419 Encounter for procedure for purposes other than remedying health state, unspecified: Secondary | ICD-10-CM | POA: Diagnosis not present

## 2022-09-12 ENCOUNTER — Encounter: Payer: Self-pay | Admitting: Family Medicine

## 2022-09-13 ENCOUNTER — Other Ambulatory Visit: Payer: Self-pay

## 2022-09-13 DIAGNOSIS — M545 Low back pain, unspecified: Secondary | ICD-10-CM

## 2022-09-13 MED ORDER — BACLOFEN 10 MG PO TABS
10.0000 mg | ORAL_TABLET | Freq: Three times a day (TID) | ORAL | 0 refills | Status: DC
Start: 2022-09-13 — End: 2023-02-11

## 2022-09-13 MED ORDER — NAPROXEN 500 MG PO TABS
500.0000 mg | ORAL_TABLET | Freq: Two times a day (BID) | ORAL | 0 refills | Status: DC
Start: 2022-09-13 — End: 2022-11-03

## 2022-10-08 DIAGNOSIS — Z419 Encounter for procedure for purposes other than remedying health state, unspecified: Secondary | ICD-10-CM | POA: Diagnosis not present

## 2022-10-12 ENCOUNTER — Encounter: Payer: Self-pay | Admitting: Nurse Practitioner

## 2022-10-12 ENCOUNTER — Telehealth: Payer: Medicaid Other | Admitting: Nurse Practitioner

## 2022-10-12 DIAGNOSIS — R110 Nausea: Secondary | ICD-10-CM | POA: Diagnosis not present

## 2022-10-12 MED ORDER — ONDANSETRON 4 MG PO TBDP
4.0000 mg | ORAL_TABLET | Freq: Three times a day (TID) | ORAL | 0 refills | Status: DC | PRN
Start: 2022-10-12 — End: 2023-03-06

## 2022-10-12 NOTE — Progress Notes (Signed)
Virtual Visit Consent   [name], you are scheduled for a virtual visit with a [facility] provider today. Just as with appointments in the office, your consent must be obtained to participate. Your consent will be active for this visit and any virtual visit you may have with one of our providers in the next 365 days. If you have a MyChart account, a copy of this consent can be sent to you electronically.  As this is a virtual visit, video technology does not allow for your provider to perform a traditional examination. This may limit your provider's ability to fully assess your condition. If your provider identifies any concerns that need to be evaluated in person or the need to arrange testing (such as labs, EKG, etc.), we will make arrangements to do so. Although advances in technology are sophisticated, we cannot ensure that it will always work on either your end or our end. If the connection with a video visit is poor, the visit may have to be switched to a telephone visit. With either a video or telephone visit, we are not always able to ensure that we have a secure connection.  By engaging in this virtual visit, you consent to the provision of healthcare and authorize for your insurance to be billed (if applicable) for the services provided during this visit. Depending on your insurance coverage, you may receive a charge related to this service.  I need to obtain your verbal consent now. Are you willing to proceed with your visit today? CEAZIA Hicks has provided verbal consent on 10/12/2022 for a virtual visit (video or telephone). Viviano Simas, FNP  Date: 10/12/2022 7:51 AM  Virtual Visit via Video Note   I, Viviano Simas, connected with  JAQUETTE Hicks  ([id number], 1985/09/15) on 10/12/22 at  8:00 AM EDT by a video-enabled telemedicine application and verified that I am speaking with the correct person using two identifiers.  Location: Patient: Virtual Visit Location Patient:  Home Provider: Virtual Visit Location Provider: Home Office   I discussed the limitations of evaluation and management by telemedicine and the availability of in person appointments. The patient expressed understanding and agreed to proceed.    History of Present Illness: [name] is a 37 y.o. who identifies as a female who was assigned female at birth, and is being seen today for nausea.   She woke up today feeling nauseated  Mouth is watering  Feels she may have eaten something last night that didn't agree with her   Denies vomiting or diarrhea   Denies fever   Denies other symptoms of headache or body aches  She denies any relation to her menstraul cycle   The nausea was present when she woke up  Did not wake her up in the night   Denies possible pregnancy  Has been exposed to others at work with GI symptoms recently   Problems:  Patient Active Problem List   Diagnosis Date Noted   Pap smear for cervical cancer screening 03/21/2022   Lumbar pain with radiation down left leg 02/14/2022   S/P cesarean section 05/19/2012    Allergies:  Allergies  Allergen Reactions   Bee Venom Hives, Shortness Of Breath, Swelling and Rash   Mango Flavor Hives, Itching and Rash   Peanut-Containing Drug Products Hives, Itching and Rash   Penicillins Rash    Has patient had a PCN reaction causing immediate rash, facial/tongue/throat swelling, SOB or lightheadedness with hypotension: No Has patient had  a PCN reaction causing severe rash involving mucus membranes or skin necrosis: No Has patient had a PCN reaction that required hospitalization: No Has patient had a PCN reaction occurring within the last 10 years: No If all of the above answers are "NO", then may proceed with Cephalosporin use.    Medications:  Current Outpatient Medications:    baclofen (LIORESAL) 10 MG tablet, Take 1 tablet (10 mg total) by mouth 3 (three) times daily., Disp: 30 each, Rfl: 0   fluticasone  (FLONASE) 50 MCG/ACT nasal spray, Place 2 sprays into both nostrils daily., Disp: 16 g, Rfl: 0   naproxen (NAPROSYN) 500 MG tablet, Take 1 tablet (500 mg total) by mouth 2 (two) times daily with a meal., Disp: 30 tablet, Rfl: 0   Vitamin D, Ergocalciferol, (DRISDOL) 1.25 MG (50000 UNIT) CAPS capsule, TAKE 1 CAPSULE BY MOUTH EVERY 7 DAYS, Disp: 5 capsule, Rfl: 2  Observations/Objective: Patient is well-developed, well-nourished in no acute distress.  Resting comfortably  at home.  Head is normocephalic, atraumatic.  No labored breathing.  Speech is clear and coherent with logical content.  Patient is alert and oriented at baseline.    Assessment and Plan: 1. Nausea  Advised BRAT/bland diet  Follow up for new or worsening symptoms as discussed Push fluids and rest   Meds ordered this encounter  Medications   ondansetron (ZOFRAN-ODT) 4 MG disintegrating tablet    Sig: Take 1 tablet (4 mg total) by mouth every 8 (eight) hours as needed for nausea or vomiting.    Dispense:  20 tablet    Refill:  0        Follow Up Instructions: I discussed the assessment and treatment plan with the patient. The patient was provided an opportunity to ask questions and all were answered. The patient agreed with the plan and demonstrated an understanding of the instructions.  A copy of instructions were sent to the patient via MyChart unless otherwise noted below.    The patient was advised to call back or seek an in-person evaluation if the symptoms worsen or if the condition fails to improve as anticipated.  Time:  I spent 15 minutes with the patient via telehealth technology discussing the above problems/concerns.    Viviano Simas, FNP

## 2022-11-02 ENCOUNTER — Telehealth: Payer: Medicaid Other | Admitting: Physician Assistant

## 2022-11-02 DIAGNOSIS — R6889 Other general symptoms and signs: Secondary | ICD-10-CM | POA: Diagnosis not present

## 2022-11-03 MED ORDER — FLUTICASONE PROPIONATE 50 MCG/ACT NA SUSP
2.0000 | Freq: Every day | NASAL | 0 refills | Status: DC
Start: 2022-11-03 — End: 2023-06-01

## 2022-11-03 MED ORDER — NAPROXEN 500 MG PO TABS
500.0000 mg | ORAL_TABLET | Freq: Two times a day (BID) | ORAL | 0 refills | Status: DC
Start: 2022-11-03 — End: 2023-02-11

## 2022-11-03 NOTE — Progress Notes (Signed)
E visit for Flu like symptoms   We are sorry that you are not feeling well.  Here is how we plan to help! Based on what you have shared with me it looks like you may have flu-like symptoms that should be watched but do not seem to indicate anti-viral treatment.  Influenza or "the flu" is   an infection caused by a respiratory virus. The flu virus is highly contagious and persons who did not receive their yearly flu vaccination may "catch" the flu from close contact.  We have anti-viral medications to treat the viruses that cause this infection. They are not a "cure" and only shorten the course of the infection. These prescriptions are most effective when they are given within the first 2 days of "flu" symptoms. Antiviral medication are indicated if you have a high risk of complications from the flu. You should  also consider an antiviral medication if you are in close contact with someone who is at risk. These medications can help patients avoid complications from the flu  but have side effects that you should know. Possible side effects from Tamiflu or oseltamivir include nausea, vomiting, diarrhea, dizziness, headaches, eye redness, sleep problems or other respiratory symptoms. You should not take Tamiflu if you have an allergy to oseltamivir or any to the ingredients in Tamiflu.  Based upon your symptoms and potential risk factors I recommend that you follow the flu symptoms recommendation that I have listed below.  Your current symptoms could be consistent with COVID.  Please complete a Covid test either at home or check with your local pharmacy to see if they provide testing.    You have tested positive for COVID-19, meaning that you were infected with the novel coronavirus and could give the virus to others.  Most people with COVID-19 have mild illness and can recover at home without medical care. Do not leave your home, except to get medical care. Do not visit public areas and do not go to  places where you are unable to wear a mask. It is important that you stay home  to take care for yourself and to help protect other people in your home and community.      Isolation Instructions:   You are to isolate at home until you have been fever free for at least 24 hours without a fever-reducing medication, and symptoms have been steadily improving for 24 hours. At that time,  you can end isolation but need to mask for an additional 5 days.  If you must be around other household members who do not have symptoms, you need to make sure that both you and the family members are masking consistently with a high-quality mask.  If you note any worsening of symptoms despite treatment, please seek an in-person evaluation ASAP. If you note any significant shortness of breath or any chest pain, please seek ER evaluation. Please do not delay care!  Go to the nearest hospital ED for assessment if fever/cough/breathlessness are severe or illness seems like a threat to life.    The following symptoms may appear 2-14 days after exposure: Fever Cough Shortness of breath or difficulty breathing Chills Repeated shaking with chills Muscle pain Headache Sore throat New loss of taste or smell Fatigue Congestion or runny nose Nausea or vomiting Diarrhea  You can use medication such as prescription anti-inflammatory called Naprosyn 500 mg. Take twice daily as needed for fever or body aches for 2 weeks and prescription for Fluticasone nasal spray  2 sprays in each nostril one time per day  You may also take acetaminophen (Tylenol) as needed for fever.   ANYONE WHO HAS FLU SYMPTOMS SHOULD: Stay home. The flu is highly contagious and going out or to work exposes others! Be sure to drink plenty of fluids. Water is fine as well as fruit juices, sodas and electrolyte beverages. You may want to stay away from caffeine or alcohol. If you are nauseated, try taking small sips of liquids. How do you know if you  are getting enough fluid? Your urine should be a pale yellow or almost colorless. Get rest. Taking a steamy shower or using a humidifier may help nasal congestion and ease sore throat pain. Using a saline nasal spray works much the same way. Cough drops, hard candies and sore throat lozenges may ease your cough. Line up a caregiver. Have someone check on you regularly.   GET HELP RIGHT AWAY IF: You cannot keep down liquids or your medications. You become short of breath Your fell like you are going to pass out or loose consciousness. Your symptoms persist after you have completed your treatment plan MAKE SURE YOU  Understand these instructions. Will watch your condition. Will get help right away if you are not doing well or get worse.  Your e-visit answers were reviewed by a board certified advanced clinical practitioner to complete your personal care plan.  Depending on the condition, your plan could have included both over the counter or prescription medications.  If there is a problem please reply  once you have received a response from your provider.  Your safety is important to [place].  If you have drug allergies check your prescription carefully.    You can use MyChart to ask questions about today's visit, request a non-urgent call back, or ask for a work or school excuse for 24 hours related to this e-Visit. If it has been greater than 24 hours you will need to follow up with your provider, or enter a new e-Visit to address those concerns.  You will get an e-mail in the next two days asking about your experience.  I hope that your e-visit has been valuable and will speed your recovery. Thank you for using e-visits.  I have spent 5 minutes in review of e-visit questionnaire, review and updating patient chart, medical decision making and response to patient.   Margaretann Loveless, PA-C

## 2022-11-08 ENCOUNTER — Encounter: Payer: Self-pay | Admitting: Family Medicine

## 2022-11-08 DIAGNOSIS — Z419 Encounter for procedure for purposes other than remedying health state, unspecified: Secondary | ICD-10-CM | POA: Diagnosis not present

## 2022-11-12 ENCOUNTER — Emergency Department (HOSPITAL_COMMUNITY)
Admission: EM | Admit: 2022-11-12 | Discharge: 2022-11-12 | Disposition: A | Discharge status: Home / Self Care | Payer: Medicaid Other | Source: Home / Self Care | Attending: Emergency Medicine | Admitting: Emergency Medicine

## 2022-11-12 ENCOUNTER — Other Ambulatory Visit: Payer: Self-pay

## 2022-11-12 ENCOUNTER — Encounter (HOSPITAL_COMMUNITY): Payer: Self-pay

## 2022-11-12 DIAGNOSIS — Z91010 Allergy to peanuts: Secondary | ICD-10-CM | POA: Insufficient documentation

## 2022-11-12 DIAGNOSIS — R110 Nausea: Secondary | ICD-10-CM | POA: Diagnosis not present

## 2022-11-12 DIAGNOSIS — N3000 Acute cystitis without hematuria: Secondary | ICD-10-CM | POA: Diagnosis not present

## 2022-11-12 DIAGNOSIS — R102 Pelvic and perineal pain: Secondary | ICD-10-CM | POA: Diagnosis not present

## 2022-11-12 DIAGNOSIS — R109 Unspecified abdominal pain: Secondary | ICD-10-CM | POA: Diagnosis present

## 2022-11-12 LAB — CBC
HCT: 40.7 % (ref 36.0–46.0)
Hemoglobin: 13.8 g/dL (ref 12.0–15.0)
MCH: 31.8 pg (ref 26.0–34.0)
MCHC: 33.9 g/dL (ref 30.0–36.0)
MCV: 93.8 fL (ref 80.0–100.0)
Platelets: 472 10*3/uL — ABNORMAL HIGH (ref 150–400)
RBC: 4.34 MIL/uL (ref 3.87–5.11)
RDW: 12.1 % (ref 11.5–15.5)
WBC: 9.1 10*3/uL (ref 4.0–10.5)
nRBC: 0 % (ref 0.0–0.2)

## 2022-11-12 LAB — COMPREHENSIVE METABOLIC PANEL
ALT: 20 U/L (ref 0–44)
AST: 14 U/L — ABNORMAL LOW (ref 15–41)
Albumin: 3.9 g/dL (ref 3.5–5.0)
Alkaline Phosphatase: 71 U/L (ref 38–126)
Anion gap: 8 (ref 5–15)
BUN: 7 mg/dL (ref 6–20)
CO2: 27 mmol/L (ref 22–32)
Calcium: 8.9 mg/dL (ref 8.9–10.3)
Chloride: 97 mmol/L — ABNORMAL LOW (ref 98–111)
Creatinine, Ser: 0.82 mg/dL (ref 0.44–1.00)
GFR, Estimated: >60 mL/min (ref >60)
Glucose, Bld: 85 mg/dL (ref 70–99)
Potassium: 3.7 mmol/L (ref 3.5–5.1)
Sodium: 132 mmol/L — ABNORMAL LOW (ref 135–145)
Total Bilirubin: 0.3 mg/dL (ref 0.3–1.2)
Total Protein: 7.6 g/dL (ref 6.5–8.1)

## 2022-11-12 LAB — URINALYSIS, ROUTINE W REFLEX MICROSCOPIC
Bilirubin Urine: NEGATIVE
Glucose, UA: NEGATIVE mg/dL
Hgb urine dipstick: NEGATIVE
Ketones, ur: 5 mg/dL — AB
Nitrite: POSITIVE — AB
Protein, ur: NEGATIVE mg/dL
Specific Gravity, Urine: 1.009 (ref 1.005–1.030)
WBC, UA: >50 WBC/hpf (ref 0–5)
pH: 6 (ref 5.0–8.0)

## 2022-11-12 LAB — POC URINE PREG, ED: Preg Test, Ur: NEGATIVE

## 2022-11-12 LAB — LIPASE, BLOOD: Lipase: 29 U/L (ref 11–51)

## 2022-11-12 MED ORDER — CEPHALEXIN 500 MG PO CAPS
1000.0000 mg | ORAL_CAPSULE | Freq: Once | ORAL | Status: AC
  Administered 2022-11-12: 1000 mg via ORAL
  Filled 2022-11-12: qty 2

## 2022-11-12 MED ORDER — CEPHALEXIN 500 MG PO CAPS
500.0000 mg | ORAL_CAPSULE | Freq: Four times a day (QID) | ORAL | 0 refills | Status: DC
Start: 2022-11-12 — End: 2023-03-06

## 2022-11-12 NOTE — Discharge Instructions (Addendum)
Evaluation today revealed that you likely have a UTI.  Starting on Keflex.  Recommend you follow-up with your PCP.  Recommend you follow-up with your OB/GYN for vaginal symptoms.  If you have abnormal vaginal bleeding or discharge, flank tenderness or any other abdominal pain or any other concern please return emergency department further evaluation.

## 2022-11-12 NOTE — ED Triage Notes (Signed)
Pt comes in with complaint of abdominal pain starting above her umbilicus down to her groin area. Pt states she did an e-visit end of the month and was rx naproxen and nausea medicine which helped but now she feels "pressure" in her vagina area.

## 2022-11-12 NOTE — ED Provider Notes (Signed)
[facility] AT [facility] Provider Note   CSN: [id number] Arrival date & time: 11/12/22  1639     History  Chief Complaint  Patient presents with   Abdominal Pain   HPI VIANCA GARDENHIRE is a 37 y.o. female s/p c-section in 2014 presenting for abdominal pain.  Started about a week and a half ago.  Located just below the umbilicus and extends to the lower abdomen.  Endorses intermittent nausea but no vomiting or diarrhea.  Denies fever at home.  Denies urinary symptoms.  Did states she has some vaginal "pressure" with intercourse which is new within the last week.  Denies abnormal vaginal discharge or bleeding.   Abdominal Pain      Home Medications Prior to Admission medications   Medication Sig Start Date End Date Taking? Authorizing Provider  cephALEXin (KEFLEX) 500 MG capsule Take 1 capsule (500 mg total) by mouth 4 (four) times daily. 11/12/22  Yes Gareth Eagle, PA-C  baclofen (LIORESAL) 10 MG tablet Take 1 tablet (10 mg total) by mouth 3 (three) times daily. 09/13/22   Gilmore Laroche, FNP  fluticasone (FLONASE) 50 MCG/ACT nasal spray Place 2 sprays into both nostrils daily. 11/03/22   Margaretann Loveless, PA-C  naproxen (NAPROSYN) 500 MG tablet Take 1 tablet (500 mg total) by mouth 2 (two) times daily with a meal. 11/03/22   Burnette, Alessandra Bevels, PA-C  ondansetron (ZOFRAN-ODT) 4 MG disintegrating tablet Take 1 tablet (4 mg total) by mouth every 8 (eight) hours as needed for nausea or vomiting. 10/12/22   Viviano Simas, FNP  Vitamin D, Ergocalciferol, (DRISDOL) 1.25 MG (50000 UNIT) CAPS capsule TAKE 1 CAPSULE BY MOUTH EVERY 7 DAYS 06/08/22   Gilmore Laroche, FNP      Allergies    Bee venom, Mango flavor, Peanut-containing drug products, and Penicillins    Review of Systems   Review of Systems  Gastrointestinal:  Positive for abdominal pain.    Physical Exam   Vitals:   11/12/22 1731  BP: (!) 133/91  Pulse: 94  Resp: 16  Temp: 98.7 F (37.1  C)  SpO2: 99%    CONSTITUTIONAL:  well-appearing, NAD NEURO:  Alert and oriented x 3, CN 3-12 grossly intact EYES:  eyes equal and reactive ENT/NECK:  Supple, no stridor  CARDIO:  Regular rate and rhythm, appears well-perfused  PULM:  No respiratory distress, CTAB GI/GU:  non-distended, soft, suprapubic tenderness, no cva tenderness MSK/SPINE:  No gross deformities, no edema, moves all extremities  SKIN:  no rash, atraumatic  *Additional and/or pertinent findings included in MDM below  ED Results / Procedures / Treatments   Labs (all labs ordered are listed, but only abnormal results are displayed) Labs Reviewed  COMPREHENSIVE METABOLIC PANEL - Abnormal; Notable for the following components:      Result Value   Sodium 132 (*)    Chloride 97 (*)    AST 14 (*)    All other components within normal limits  CBC - Abnormal; Notable for the following components:   Platelets 472 (*)    All other components within normal limits  URINALYSIS, ROUTINE W REFLEX MICROSCOPIC - Abnormal; Notable for the following components:   APPearance HAZY (*)    Ketones, ur 5 (*)    Nitrite POSITIVE (*)    Leukocytes,Ua LARGE (*)    Bacteria, UA FEW (*)    All other components within normal limits  LIPASE, BLOOD  POC URINE PREG, ED  EKG None  Radiology No results found.  Procedures Procedures    Medications Ordered in ED Medications  cephALEXin (KEFLEX) capsule 1,000 mg (has no administration in time range)    ED Course/ Medical Decision Making/ A&P                                 Medical Decision Making Amount and/or Complexity of Data Reviewed Labs: ordered.   Initial Impression and Ddx 37 year old well-appearing female presenting for abdominal pain.  Exam notable for suprapubic tenderness.  DDx includes appendicitis, diverticulitis, UTI, pyelonephritis, ovarian torsion and ectopic pregnancy Patient PMH that increases complexity of ED encounter: s/p  c-section  Interpretation of Diagnostics - I independent reviewed and interpreted the labs as followed: Positive nitrites, pyuria and bacteriuria   Patient Reassessment and Ultimate Disposition/Management In setting of suprapubic tenderness with findings on urinalysis, primary concern is UTI.  Treated with Keflex.  Have low suspicion for pyelo given no CVA tenderness and no fever or leukocytosis.  Advised her to follow-up with her OB/GYN for ongoing UTI and vaginal symptoms during intercourse.  Discussed return precautions.  Vital stable.  Discharged home in good condition.  Patient management required discussion with the following services or consulting groups:  None  Complexity of Problems Addressed Acute complicated illness or Injury  Additional Data Reviewed and Analyzed Further history obtained from: Past medical history and medications listed in the EMR and Prior ED visit notes  Patient Encounter Risk Assessment Prescriptions         Final Clinical Impression(s) / ED Diagnoses Final diagnoses:  Acute cystitis without hematuria    Rx / DC Orders ED Discharge Orders          Ordered    cephALEXin (KEFLEX) 500 MG capsule  4 times daily        11/12/22 2048              Gareth Eagle, PA-C 11/12/22 2049    Derwood Kaplan, MD 11/13/22 1512

## 2022-11-13 ENCOUNTER — Other Ambulatory Visit: Payer: Self-pay | Admitting: Family Medicine

## 2022-11-13 DIAGNOSIS — E559 Vitamin D deficiency, unspecified: Secondary | ICD-10-CM

## 2022-12-09 DIAGNOSIS — Z419 Encounter for procedure for purposes other than remedying health state, unspecified: Secondary | ICD-10-CM | POA: Diagnosis not present

## 2022-12-19 ENCOUNTER — Ambulatory Visit: Payer: Medicaid Other | Admitting: Family Medicine

## 2022-12-22 ENCOUNTER — Telehealth: Payer: Medicaid Other | Admitting: Family Medicine

## 2022-12-22 DIAGNOSIS — J301 Allergic rhinitis due to pollen: Secondary | ICD-10-CM | POA: Diagnosis not present

## 2022-12-22 MED ORDER — PREDNISONE 10 MG (21) PO TBPK: ORAL_TABLET | ORAL | 0 refills | Status: DC

## 2022-12-22 NOTE — Progress Notes (Signed)
Virtual Visit Consent   [name], you are scheduled for a virtual visit with a [facility] provider today. Just as with appointments in the office, your consent must be obtained to participate. Your consent will be active for this visit and any virtual visit you may have with one of our providers in the next 365 days. If you have a MyChart account, a copy of this consent can be sent to you electronically.  As this is a virtual visit, video technology does not allow for your provider to perform a traditional examination. This may limit your provider's ability to fully assess your condition. If your provider identifies any concerns that need to be evaluated in person or the need to arrange testing (such as labs, EKG, etc.), we will make arrangements to do so. Although advances in technology are sophisticated, we cannot ensure that it will always work on either your end or our end. If the connection with a video visit is poor, the visit may have to be switched to a telephone visit. With either a video or telephone visit, we are not always able to ensure that we have a secure connection.  By engaging in this virtual visit, you consent to the provision of healthcare and authorize for your insurance to be billed (if applicable) for the services provided during this visit. Depending on your insurance coverage, you may receive a charge related to this service.  I need to obtain your verbal consent now. Are you willing to proceed with your visit today? [name] has provided verbal consent on 12/22/2022 for a virtual visit (video or telephone). Georgana Curio, FNP  Date: 12/22/2022 5:37 PM  Virtual Visit via Video Note   I, Georgana Curio, connected with  [name]  ([id number], [birth year]) on 12/22/22 at  6:00 PM EDT by a video-enabled telemedicine application and verified that I am speaking with the correct person using two identifiers.  Location: Patient: Virtual Visit Location Patient:  Home Provider: Virtual Visit Location Provider: Home Office   I discussed the limitations of evaluation and management by telemedicine and the availability of in person appointments. The patient expressed understanding and agreed to proceed.    History of Present Illness: [name] is a 37 y.o. who identifies as a female who was assigned female at birth, and is being seen today for allergies, nasal congestion, burning in nose and post nasal. No fever. No exposure to covid. Marland Kitchen  HPI: HPI  Problems:  Patient Active Problem List   Diagnosis Date Noted   Pap smear for cervical cancer screening 03/21/2022   Lumbar pain with radiation down left leg 02/14/2022   S/P cesarean section 05/19/2012    Allergies:  Allergies  Allergen Reactions   Bee Venom Hives, Shortness Of Breath, Swelling and Rash   Mango Flavor Hives, Itching and Rash   Peanut-Containing Drug Products Hives, Itching and Rash   Penicillins Rash    Has patient had a PCN reaction causing immediate rash, facial/tongue/throat swelling, SOB or lightheadedness with hypotension: No Has patient had a PCN reaction causing severe rash involving mucus membranes or skin necrosis: No Has patient had a PCN reaction that required hospitalization: No Has patient had a PCN reaction occurring within the last 10 years: No If all of the above answers are "NO", then may proceed with Cephalosporin use.    Medications:  Current Outpatient Medications:    predniSONE (STERAPRED UNI-PAK 21 TAB) 10 MG (21) TBPK tablet, Prednisone 10 mg-  6 day dose pack as directed., Disp: 1 each, Rfl: 0   baclofen (LIORESAL) 10 MG tablet, Take 1 tablet (10 mg total) by mouth 3 (three) times daily., Disp: 30 each, Rfl: 0   cephALEXin (KEFLEX) 500 MG capsule, Take 1 capsule (500 mg total) by mouth 4 (four) times daily., Disp: 20 capsule, Rfl: 0   fluticasone (FLONASE) 50 MCG/ACT nasal spray, Place 2 sprays into both nostrils daily., Disp: 16 g, Rfl: 0   naproxen  (NAPROSYN) 500 MG tablet, Take 1 tablet (500 mg total) by mouth 2 (two) times daily with a meal., Disp: 30 tablet, Rfl: 0   ondansetron (ZOFRAN-ODT) 4 MG disintegrating tablet, Take 1 tablet (4 mg total) by mouth every 8 (eight) hours as needed for nausea or vomiting., Disp: 20 tablet, Rfl: 0   Vitamin D, Ergocalciferol, (DRISDOL) 1.25 MG (50000 UNIT) CAPS capsule, TAKE 1 CAPSULE BY MOUTH EVERY 7 DAYS, Disp: 5 capsule, Rfl: 2  Observations/Objective: Patient is well-developed, well-nourished in no acute distress.  Resting comfortably  at home.  Head is normocephalic, atraumatic.  No labored breathing.  Speech is clear and coherent with logical content.  Patient is alert and oriented at baseline.    Assessment and Plan: 1. Allergic rhinitis due to pollen, unspecified seasonality  Urgent care if sx persist or worsen.   Follow Up Instructions: I discussed the assessment and treatment plan with the patient. The patient was provided an opportunity to ask questions and all were answered. The patient agreed with the plan and demonstrated an understanding of the instructions.  A copy of instructions were sent to the patient via MyChart unless otherwise noted below.     The patient was advised to call back or seek an in-person evaluation if the symptoms worsen or if the condition fails to improve as anticipated.  Time:  I spent 10 minutes with the patient via telehealth technology discussing the above problems/concerns.    Georgana Curio, FNP

## 2022-12-22 NOTE — Patient Instructions (Signed)

## 2022-12-22 NOTE — Progress Notes (Signed)
No charge- see previous notes. DWB

## 2022-12-26 ENCOUNTER — Telehealth: Payer: Medicaid Other | Admitting: Family Medicine

## 2022-12-26 DIAGNOSIS — J069 Acute upper respiratory infection, unspecified: Secondary | ICD-10-CM

## 2022-12-26 MED ORDER — PSEUDOEPH-BROMPHEN-DM 30-2-10 MG/5ML PO SYRP
5.0000 mL | ORAL_SOLUTION | Freq: Three times a day (TID) | ORAL | 0 refills | Status: DC | PRN
Start: 2022-12-26 — End: 2023-03-06

## 2022-12-26 MED ORDER — AZITHROMYCIN 250 MG PO TABS: ORAL_TABLET | ORAL | 0 refills | Status: AC

## 2022-12-26 NOTE — Progress Notes (Signed)
Virtual Visit Consent   JERLEEN BABULA, you are scheduled for a virtual visit with a [facility] provider today. Just as with appointments in the office, your consent must be obtained to participate. Your consent will be active for this visit and any virtual visit you may have with one of our providers in the next 365 days. If you have a MyChart account, a copy of this consent can be sent to you electronically.  As this is a virtual visit, video technology does not allow for your provider to perform a traditional examination. This may limit your provider's ability to fully assess your condition. If your provider identifies any concerns that need to be evaluated in person or the need to arrange testing (such as labs, EKG, etc.), we will make arrangements to do so. Although advances in technology are sophisticated, we cannot ensure that it will always work on either your end or our end. If the connection with a video visit is poor, the visit may have to be switched to a telephone visit. With either a video or telephone visit, we are not always able to ensure that we have a secure connection.  By engaging in this virtual visit, you consent to the provision of healthcare and authorize for your insurance to be billed (if applicable) for the services provided during this visit. Depending on your insurance coverage, you may receive a charge related to this service.  I need to obtain your verbal consent now. Are you willing to proceed with your visit today? [name] has provided verbal consent on 12/26/2022 for a virtual visit (video or telephone). Freddy Finner, NP  Date: 12/26/2022 11:16 AM  Virtual Visit via Video Note   I, Freddy Finner, connected with  [name]  ([id number], 1986-03-30) on 12/26/22 at 11:15 AM EDT by a video-enabled telemedicine application and verified that I am speaking with the correct person using two identifiers.  Location: Patient: Virtual Visit Location  Patient: Home Provider: Virtual Visit Location Provider: Home Office   I discussed the limitations of evaluation and management by telemedicine and the availability of in person appointments. The patient expressed understanding and agreed to proceed.    History of Present Illness: [name] is a 37 y.o. who identifies as a female who was assigned female at birth, and is being seen today for congestion  Onset was Saturday- nasal passage burning and back of the throat near the top was burning as well.  Associated symptoms are alternating nasal congestion, mild cough with drainage, but not consistently, bilateral watery eyes Modifying factors are flonase and prednisone (that was ordered on 9/14 for sx) Denies chest pain, shortness of breath, fevers, chills, ear pain   Exposure to sick contacts- known husband has had congestion  COVID test: negative taken on Sunday    Problems:  Patient Active Problem List   Diagnosis Date Noted   Pap smear for cervical cancer screening 03/21/2022   Lumbar pain with radiation down left leg 02/14/2022   S/P cesarean section 05/19/2012    Allergies:  Allergies  Allergen Reactions   Bee Venom Hives, Shortness Of Breath, Swelling and Rash   Mango Flavor Hives, Itching and Rash   Peanut-Containing Drug Products Hives, Itching and Rash   Penicillins Rash    Has patient had a PCN reaction causing immediate rash, facial/tongue/throat swelling, SOB or lightheadedness with hypotension: No Has patient had a PCN reaction causing severe rash involving mucus membranes or skin necrosis:  No Has patient had a PCN reaction that required hospitalization: No Has patient had a PCN reaction occurring within the last 10 years: No If all of the above answers are "NO", then may proceed with Cephalosporin use.    Medications:  Current Outpatient Medications:    baclofen (LIORESAL) 10 MG tablet, Take 1 tablet (10 mg total) by mouth 3 (three) times daily., Disp: 30  each, Rfl: 0   cephALEXin (KEFLEX) 500 MG capsule, Take 1 capsule (500 mg total) by mouth 4 (four) times daily., Disp: 20 capsule, Rfl: 0   fluticasone (FLONASE) 50 MCG/ACT nasal spray, Place 2 sprays into both nostrils daily., Disp: 16 g, Rfl: 0   naproxen (NAPROSYN) 500 MG tablet, Take 1 tablet (500 mg total) by mouth 2 (two) times daily with a meal., Disp: 30 tablet, Rfl: 0   ondansetron (ZOFRAN-ODT) 4 MG disintegrating tablet, Take 1 tablet (4 mg total) by mouth every 8 (eight) hours as needed for nausea or vomiting., Disp: 20 tablet, Rfl: 0   predniSONE (STERAPRED UNI-PAK 21 TAB) 10 MG (21) TBPK tablet, Prednisone 10 mg- 6 day dose pack as directed., Disp: 1 each, Rfl: 0   Vitamin D, Ergocalciferol, (DRISDOL) 1.25 MG (50000 UNIT) CAPS capsule, TAKE 1 CAPSULE BY MOUTH EVERY 7 DAYS, Disp: 5 capsule, Rfl: 2  Observations/Objective: Patient is well-developed, well-nourished in no acute distress.  Resting comfortably  at home.  Head is normocephalic, atraumatic.  No labored breathing.  Speech is clear and coherent with logical content.  Patient is alert and oriented at baseline.    Assessment and Plan: 1. URI with cough and congestion  - brompheniramine-pseudoephedrine-DM 30-2-10 MG/5ML syrup; Take 5 mLs by mouth 3 (three) times daily as needed.  Dispense: 120 mL; Refill: 0  URI recommendations: Delayd RX for Saturday if not improved. - Increased rest - Increasing Fluids - Acetaminophen / ibuprofen as needed for fever/pain.  - Salt water gargling, chloraseptic spray and throat lozenges - Mucinex if mucus is present and increasing.  - Saline nasal spray if congestion or if nasal passages feel dry. - Humidifying the air.   Reviewed side effects, risks and benefits of medication.    Patient acknowledged agreement and understanding of the plan.   Past Medical, Surgical, Social History, Allergies, and Medications have been Reviewed.   Follow Up Instructions: I discussed the  assessment and treatment plan with the patient. The patient was provided an opportunity to ask questions and all were answered. The patient agreed with the plan and demonstrated an understanding of the instructions.  A copy of instructions were sent to the patient via MyChart unless otherwise noted below.     The patient was advised to call back or seek an in-person evaluation if the symptoms worsen or if the condition fails to improve as anticipated.  Time:  I spent 9 minutes with the patient via telehealth technology discussing the above problems/concerns.    Freddy Finner, NP

## 2022-12-26 NOTE — Patient Instructions (Addendum)
  [name], thank you for joining Freddy Finner, NP for today's virtual visit.  While this provider is not your primary care provider (PCP), if your PCP is located in our provider database this encounter information will be shared with them immediately following your visit.   A [facility] MyChart account gives you access to today's visit and all your visits, tests, and labs performed at [facility] " click here if you don't have a [facility] MyChart account or go to [email or web address]  Consent: (Patient) [name] provided verbal consent for this virtual visit at the beginning of the encounter.  Current Medications:  Current Outpatient Medications:    baclofen (LIORESAL) 10 MG tablet, Take 1 tablet (10 mg total) by mouth 3 (three) times daily., Disp: 30 each, Rfl: 0   cephALEXin (KEFLEX) 500 MG capsule, Take 1 capsule (500 mg total) by mouth 4 (four) times daily., Disp: 20 capsule, Rfl: 0   fluticasone (FLONASE) 50 MCG/ACT nasal spray, Place 2 sprays into both nostrils daily., Disp: 16 g, Rfl: 0   naproxen (NAPROSYN) 500 MG tablet, Take 1 tablet (500 mg total) by mouth 2 (two) times daily with a meal., Disp: 30 tablet, Rfl: 0   ondansetron (ZOFRAN-ODT) 4 MG disintegrating tablet, Take 1 tablet (4 mg total) by mouth every 8 (eight) hours as needed for nausea or vomiting., Disp: 20 tablet, Rfl: 0   predniSONE (STERAPRED UNI-PAK 21 TAB) 10 MG (21) TBPK tablet, Prednisone 10 mg- 6 day dose pack as directed., Disp: 1 each, Rfl: 0   Vitamin D, Ergocalciferol, (DRISDOL) 1.25 MG (50000 UNIT) CAPS capsule, TAKE 1 CAPSULE BY MOUTH EVERY 7 DAYS, Disp: 5 capsule, Rfl: 2   Medications ordered in this encounter:  No orders of the defined types were placed in this encounter.    *If you need refills on other medications prior to your next appointment, please contact your pharmacy*  Follow-Up: Call back or seek an in-person evaluation if the symptoms worsen or if the  condition fails to improve as anticipated.  [facility] [phone]  Other Instructions  URI recommendations: - Increased rest - Increasing Fluids - Acetaminophen / ibuprofen as needed for fever/pain.  - Salt water gargling, chloraseptic spray and throat lozenges - Mucinex if mucus is present and increasing.  - Saline nasal spray if congestion or if nasal passages feel dry. - Humidifying the air.    If you have been instructed to have an in-person evaluation today at a local Urgent Care facility, please use the link below. It will take you to a list of all of our available [facility], including address, phone number and hours of operation. Please do not delay care.  [facility]  If you or a family member do not have a primary care provider, use the link below to schedule a visit and establish care. When you choose a [facility] primary care physician or advanced practice provider, you gain a long-term partner in health. Find a Primary Care Provider  Learn more about [facility]'s in-office and virtual care options: [facility] - Get Care Now

## 2023-01-08 DIAGNOSIS — Z419 Encounter for procedure for purposes other than remedying health state, unspecified: Secondary | ICD-10-CM | POA: Diagnosis not present

## 2023-01-16 ENCOUNTER — Ambulatory Visit (INDEPENDENT_AMBULATORY_CARE_PROVIDER_SITE_OTHER): Payer: Medicaid Other | Admitting: Family Medicine

## 2023-01-16 VITALS — BP 117/78 | HR 88 | Ht 64.0 in | Wt 201.0 lb

## 2023-01-16 DIAGNOSIS — E559 Vitamin D deficiency, unspecified: Secondary | ICD-10-CM

## 2023-01-16 DIAGNOSIS — B9789 Other viral agents as the cause of diseases classified elsewhere: Secondary | ICD-10-CM | POA: Diagnosis not present

## 2023-01-16 DIAGNOSIS — R7301 Impaired fasting glucose: Secondary | ICD-10-CM

## 2023-01-16 DIAGNOSIS — J028 Acute pharyngitis due to other specified organisms: Secondary | ICD-10-CM

## 2023-01-16 DIAGNOSIS — E038 Other specified hypothyroidism: Secondary | ICD-10-CM | POA: Diagnosis not present

## 2023-01-16 DIAGNOSIS — E7849 Other hyperlipidemia: Secondary | ICD-10-CM | POA: Diagnosis not present

## 2023-01-16 NOTE — Assessment & Plan Note (Addendum)
No reports of fever, chills, hoarseness, headaches, body aches, cough, facial pain or pressure  Encouraged to continue supportive care with warm salt gargles 3-4 times daily as needed

## 2023-01-16 NOTE — Patient Instructions (Signed)
I appreciate the opportunity to provide care to you today!    Follow up:  5 months  Labs: please stop by the lab during the week to get your blood drawn (CBC, CMP, TSH, Lipid profile, HgA1c, Vit D)   Attached with your AVS, you will find valuable resources for self-education. I highly recommend dedicating some time to thoroughly examine them.   Please continue to a heart-healthy diet and increase your physical activities. Try to exercise for 30mins at least five days a week.    It was a pleasure to see you and I look forward to continuing to work together on your health and well-being. Please do not hesitate to call the office if you need care or have questions about your care.  In case of emergency, please visit the Emergency Department for urgent care, or contact our clinic at [phone] to schedule an appointment. We're here to help you!   Have a wonderful day and week. With Gratitude, Gelisa Tieken MSN, FNP-BC  

## 2023-01-16 NOTE — Assessment & Plan Note (Signed)
Encouraged to continue taking her weekly vitamin D supplement 

## 2023-01-16 NOTE — Progress Notes (Signed)
Established Patient Office Visit  Subjective:  Patient ID: [name], female    DOB: Oct 24, 1985  Age: 38 y.o. MRN: [id number]  CC:  Chief Complaint  Patient presents with   Care Management    6 month f/u   Sore Throat    Pt reports scratchy throat getting better now , ongoing for a few days    HPI [name] is a 37 y.o. female with past medical history of vitamin D deficiency presents for f/u of  chronic medical conditions. For the details of today's visit, please refer to the assessment and plan.     Past Medical History:  Diagnosis Date   No pertinent past medical history     Past Surgical History:  Procedure Laterality Date   CESAREAN SECTION N/A 05/19/2012   Procedure: Primary Cesarean Section;  Surgeon: Lavina Hamman, MD;  Location: [facility];  Service: Obstetrics;  Laterality: N/A;  Primary Cesarean Section   FOOT SURGERY  as child   arch corrections    Family History  Problem Relation Age of Onset   Diabetes Mellitus II Mother    Diabetes Father    Diabetes Sister    Diabetes Maternal Grandmother    Diabetes Maternal Grandfather    Diabetes Paternal Grandmother     Social History   Socioeconomic History   Marital status: Significant Other    Spouse name: Not on file   Number of children: Not on file   Years of education: Not on file   Highest education level: 12th grade  Occupational History   Not on file  Tobacco Use   Smoking status: Never   Smokeless tobacco: Never  Vaping Use   Vaping status: Never Used  Substance and Sexual Activity   Alcohol use: No   Drug use: Not Currently    Types: Marijuana   Sexual activity: Yes  Other Topics Concern   Not on file  Social History Narrative   Not on file   Social Determinants of Health   Financial Resource Strain: Low Risk  (01/16/2023)   Overall Financial Resource Strain (CARDIA)    Difficulty of Paying Living Expenses: Not hard at all  Food Insecurity: No Food Insecurity (01/16/2023)    Hunger Vital Sign    Worried About Running Out of Food in the Last Year: Never true    Ran Out of Food in the Last Year: Never true  Transportation Needs: No Transportation Needs (01/16/2023)   PRAPARE - Administrator, Civil Service (Medical): No    Lack of Transportation (Non-Medical): No  Physical Activity: Insufficiently Active (01/16/2023)   Exercise Vital Sign    Days of Exercise per Week: 1 day    Minutes of Exercise per Session: 10 min  Stress: No Stress Concern Present (01/16/2023)   [employer] of Occupational Health - Occupational Stress Questionnaire    Feeling of Stress : Only a little  Social Connections: Moderately Isolated (01/16/2023)   Social Connection and Isolation Panel [NHANES]    Frequency of Communication with Friends and Family: More than three times a week    Frequency of Social Gatherings with Friends and Family: Once a week    Attends Religious Services: Never    Database administrator or Organizations: No    Attends Engineer, structural: Not on file    Marital Status: Living with partner  Intimate Partner Violence: Not on file    Outpatient Medications Prior to Visit  Medication  Sig Dispense Refill   baclofen (LIORESAL) 10 MG tablet Take 1 tablet (10 mg total) by mouth 3 (three) times daily. 30 each 0   brompheniramine-pseudoephedrine-DM 30-2-10 MG/5ML syrup Take 5 mLs by mouth 3 (three) times daily as needed. 120 mL 0   cephALEXin (KEFLEX) 500 MG capsule Take 1 capsule (500 mg total) by mouth 4 (four) times daily. 20 capsule 0   fluticasone (FLONASE) 50 MCG/ACT nasal spray Place 2 sprays into both nostrils daily. 16 g 0   naproxen (NAPROSYN) 500 MG tablet Take 1 tablet (500 mg total) by mouth 2 (two) times daily with a meal. 30 tablet 0   ondansetron (ZOFRAN-ODT) 4 MG disintegrating tablet Take 1 tablet (4 mg total) by mouth every 8 (eight) hours as needed for nausea or vomiting. 20 tablet 0   predniSONE (STERAPRED UNI-PAK 21  TAB) 10 MG (21) TBPK tablet Prednisone 10 mg- 6 day dose pack as directed. 1 each 0   Vitamin D, Ergocalciferol, (DRISDOL) 1.25 MG (50000 UNIT) CAPS capsule TAKE 1 CAPSULE BY MOUTH EVERY 7 DAYS 5 capsule 2   No facility-administered medications prior to visit.    Allergies  Allergen Reactions   Bee Venom Hives, Shortness Of Breath, Swelling and Rash   Mango Flavor Hives, Itching and Rash   Peanut-Containing Drug Products Hives, Itching and Rash   Penicillins Rash    Has patient had a PCN reaction causing immediate rash, facial/tongue/throat swelling, SOB or lightheadedness with hypotension: No Has patient had a PCN reaction causing severe rash involving mucus membranes or skin necrosis: No Has patient had a PCN reaction that required hospitalization: No Has patient had a PCN reaction occurring within the last 10 years: No If all of the above answers are "NO", then may proceed with Cephalosporin use.     ROS Review of Systems  Constitutional:  Negative for chills and fever.  Eyes:  Negative for visual disturbance.  Respiratory:  Negative for chest tightness and shortness of breath.   Neurological:  Negative for dizziness and headaches.      Objective:    Physical Exam HENT:     Head: Normocephalic.     Mouth/Throat:     Mouth: Mucous membranes are moist.  Cardiovascular:     Rate and Rhythm: Normal rate.     Heart sounds: Normal heart sounds.  Pulmonary:     Effort: Pulmonary effort is normal.     Breath sounds: Normal breath sounds.  Neurological:     Mental Status: She is alert.     BP 117/78   Pulse 88   Ht 5\' 4"  (1.626 m)   Wt 201 lb 0.6 oz (91.2 kg)   SpO2 98%   BMI 34.51 kg/m  Wt Readings from Last 3 Encounters:  01/16/23 201 lb 0.6 oz (91.2 kg)  11/12/22 201 lb (91.2 kg)  06/20/22 203 lb (92.1 kg)    Lab Results  Component Value Date   TSH 1.160 06/21/2022   Lab Results  Component Value Date   WBC 9.1 11/12/2022   HGB 13.8 11/12/2022   HCT  40.7 11/12/2022   MCV 93.8 11/12/2022   PLT 472 (H) 11/12/2022   Lab Results  Component Value Date   NA 132 (L) 11/12/2022   K 3.7 11/12/2022   CO2 27 11/12/2022   GLUCOSE 85 11/12/2022   BUN 7 11/12/2022   CREATININE 0.82 11/12/2022   BILITOT 0.3 11/12/2022   ALKPHOS 71 11/12/2022   AST 14 (L) 11/12/2022  ALT 20 11/12/2022   PROT 7.6 11/12/2022   ALBUMIN 3.9 11/12/2022   CALCIUM 8.9 11/12/2022   ANIONGAP 8 11/12/2022   EGFR 109 06/21/2022   Lab Results  Component Value Date   CHOL 182 06/21/2022   Lab Results  Component Value Date   HDL 46 06/21/2022   Lab Results  Component Value Date   LDLCALC 113 (H) 06/21/2022   Lab Results  Component Value Date   TRIG 127 06/21/2022   Lab Results  Component Value Date   CHOLHDL 4.0 06/21/2022   Lab Results  Component Value Date   HGBA1C 5.6 06/21/2022      Assessment & Plan:  Vitamin D deficiency Assessment & Plan: Encouraged to continue taking her weekly vitamin D supplement  Orders: -     VITAMIN D 25 Hydroxy (Vit-D Deficiency, Fractures)  Sore throat (viral) Assessment & Plan: No reports of fever, chills, hoarseness, headaches, body aches, cough, facial pain or pressure  Encouraged to continue supportive care with warm salt gargles 3-4 times daily as needed   IFG (impaired fasting glucose) -     Hemoglobin A1c  TSH (thyroid-stimulating hormone deficiency) -     TSH + free T4  Other hyperlipidemia -     Lipid panel -     CMP14+EGFR -     CBC with Differential/Platelet  Note: This chart has been completed using Engineer, civil (consulting) software, and while attempts have been made to ensure accuracy, certain words and phrases may not be transcribed as intended.    Follow-up: Return in about 5 months (around 06/16/2023).   Gilmore Laroche, FNP

## 2023-01-23 DIAGNOSIS — E038 Other specified hypothyroidism: Secondary | ICD-10-CM | POA: Diagnosis not present

## 2023-01-23 DIAGNOSIS — R7301 Impaired fasting glucose: Secondary | ICD-10-CM | POA: Diagnosis not present

## 2023-01-23 DIAGNOSIS — E7849 Other hyperlipidemia: Secondary | ICD-10-CM | POA: Diagnosis not present

## 2023-01-23 DIAGNOSIS — E559 Vitamin D deficiency, unspecified: Secondary | ICD-10-CM | POA: Diagnosis not present

## 2023-01-24 LAB — CMP14+EGFR
ALT: 15 [IU]/L (ref 0–32)
AST: 17 [IU]/L (ref 0–40)
Albumin: 4.5 g/dL (ref 3.9–4.9)
Alkaline Phosphatase: 68 [IU]/L (ref 44–121)
BUN/Creatinine Ratio: 8 — ABNORMAL LOW (ref 9–23)
BUN: 7 mg/dL (ref 6–20)
Bilirubin Total: 0.5 mg/dL (ref 0.0–1.2)
CO2: 27 mmol/L (ref 20–29)
Calcium: 9.9 mg/dL (ref 8.7–10.2)
Chloride: 100 mmol/L (ref 96–106)
Creatinine, Ser: 0.85 mg/dL (ref 0.57–1.00)
Globulin, Total: 2.4 g/dL (ref 1.5–4.5)
Glucose: 98 mg/dL (ref 70–99)
Potassium: 4.6 mmol/L (ref 3.5–5.2)
Sodium: 139 mmol/L (ref 134–144)
Total Protein: 6.9 g/dL (ref 6.0–8.5)
eGFR: 90 mL/min/{1.73_m2} (ref >59)

## 2023-01-24 LAB — CBC WITH DIFFERENTIAL/PLATELET
Basophils Absolute: 0 10*3/uL (ref 0.0–0.2)
Basos: 1 %
EOS (ABSOLUTE): 0.2 10*3/uL (ref 0.0–0.4)
Eos: 5 %
Hematocrit: 44.3 % (ref 34.0–46.6)
Hemoglobin: 14.6 g/dL (ref 11.1–15.9)
Immature Grans (Abs): 0 10*3/uL (ref 0.0–0.1)
Immature Granulocytes: 0 %
Lymphocytes Absolute: 2.6 10*3/uL (ref 0.7–3.1)
Lymphs: 48 %
MCH: 30.9 pg (ref 26.6–33.0)
MCHC: 33 g/dL (ref 31.5–35.7)
MCV: 94 fL (ref 79–97)
Monocytes Absolute: 0.5 10*3/uL (ref 0.1–0.9)
Monocytes: 10 %
Neutrophils Absolute: 1.9 10*3/uL (ref 1.4–7.0)
Neutrophils: 36 %
Platelets: 332 10*3/uL (ref 150–450)
RBC: 4.72 x10E6/uL (ref 3.77–5.28)
RDW: 13.8 % (ref 11.7–15.4)
WBC: 5.3 10*3/uL (ref 3.4–10.8)

## 2023-01-24 LAB — LIPID PANEL
Chol/HDL Ratio: 5.8 {ratio} — ABNORMAL HIGH (ref 0.0–4.4)
Cholesterol, Total: 215 mg/dL — ABNORMAL HIGH (ref 100–199)
HDL: 37 mg/dL — ABNORMAL LOW (ref >39)
LDL Chol Calc (NIH): 150 mg/dL — ABNORMAL HIGH (ref 0–99)
Triglycerides: 155 mg/dL — ABNORMAL HIGH (ref 0–149)
VLDL Cholesterol Cal: 28 mg/dL (ref 5–40)

## 2023-01-24 LAB — TSH+FREE T4
Free T4: 1.21 ng/dL (ref 0.82–1.77)
TSH: 1.34 u[IU]/mL (ref 0.450–4.500)

## 2023-01-24 LAB — VITAMIN D 25 HYDROXY (VIT D DEFICIENCY, FRACTURES): Vit D, 25-Hydroxy: 49.8 ng/mL (ref 30.0–100.0)

## 2023-01-24 LAB — HEMOGLOBIN A1C
Est. average glucose Bld gHb Est-mCnc: 120 mg/dL
Hgb A1c MFr Bld: 5.8 % — ABNORMAL HIGH (ref 4.8–5.6)

## 2023-01-31 ENCOUNTER — Encounter: Payer: Self-pay | Admitting: Family Medicine

## 2023-02-08 DIAGNOSIS — Z419 Encounter for procedure for purposes other than remedying health state, unspecified: Secondary | ICD-10-CM | POA: Diagnosis not present

## 2023-02-10 ENCOUNTER — Encounter: Payer: Self-pay | Admitting: Family Medicine

## 2023-02-11 ENCOUNTER — Other Ambulatory Visit: Payer: Self-pay | Admitting: Family Medicine

## 2023-02-11 ENCOUNTER — Other Ambulatory Visit: Payer: Self-pay

## 2023-02-11 DIAGNOSIS — M545 Low back pain, unspecified: Secondary | ICD-10-CM

## 2023-02-11 DIAGNOSIS — R6889 Other general symptoms and signs: Secondary | ICD-10-CM

## 2023-02-11 MED ORDER — BACLOFEN 10 MG PO TABS
10.0000 mg | ORAL_TABLET | Freq: Three times a day (TID) | ORAL | 0 refills | Status: DC
Start: 2023-02-11 — End: 2023-03-06

## 2023-02-11 MED ORDER — NAPROXEN 500 MG PO TABS
500.0000 mg | ORAL_TABLET | Freq: Two times a day (BID) | ORAL | 1 refills | Status: DC
Start: 2023-02-11 — End: 2023-09-03

## 2023-03-06 ENCOUNTER — Ambulatory Visit (INDEPENDENT_AMBULATORY_CARE_PROVIDER_SITE_OTHER): Payer: Medicaid Other | Admitting: Family Medicine

## 2023-03-06 VITALS — BP 109/75 | HR 80 | Ht 64.0 in | Wt 202.0 lb

## 2023-03-06 DIAGNOSIS — J302 Other seasonal allergic rhinitis: Secondary | ICD-10-CM

## 2023-03-06 DIAGNOSIS — J309 Allergic rhinitis, unspecified: Secondary | ICD-10-CM | POA: Insufficient documentation

## 2023-03-06 MED ORDER — MONTELUKAST SODIUM 10 MG PO TABS
10.0000 mg | ORAL_TABLET | Freq: Every day | ORAL | 3 refills | Status: DC
Start: 2023-03-06 — End: 2023-09-03

## 2023-03-06 MED ORDER — LEVOCETIRIZINE DIHYDROCHLORIDE 5 MG PO TABS
5.0000 mg | ORAL_TABLET | Freq: Every evening | ORAL | 2 refills | Status: DC
Start: 2023-03-06 — End: 2023-06-03

## 2023-03-06 NOTE — Assessment & Plan Note (Addendum)
Encouraged to start taking Xyzal 5 mg at bedtime and Singulair 10 mg daily for better control of your allergic rhinitis symptoms. Encouraged to increase her intake of anti-inflammatory foods rich in omega-3 fatty acids and antioxidant shots as fish, nuts, fruits and vegetables to reduce inflammation

## 2023-03-06 NOTE — Progress Notes (Signed)
Established Patient Office Visit  Subjective:  Patient ID: [name], female    DOB: Jan 15, 1986  Age: 37 y.o. MRN: [id number]  CC:  Chief Complaint  Patient presents with   Care Management    5 month f/u, pt reports seasonal alllergies worsening would like to discuss alternative medications    HPI [name] is a 37 y.o. female presents with a report that her allergic rhinitis symptoms have not resolved. She was seen on 12/22/2022 during a follow-up visit with FNP Tanda Rockers and was treated with a prednisone taper pack for allergic rhinitis due to pollen. The patient was then seen on 12/26/2022 by FNP Tereasa Coop for an upper respiratory infection with cough and congestion and was treated with azithromycin and brompheniramine-pseudoephedrine-DM. Today, she complains of persistent nasal itching, sneezing, and a runny nose. No fever, chills, or generalized malaise are reported.  Past Medical History:  Diagnosis Date   No pertinent past medical history     Past Surgical History:  Procedure Laterality Date   CESAREAN SECTION N/A 05/19/2012   Procedure: Primary Cesarean Section;  Surgeon: Lavina Hamman, MD;  Location: [facility];  Service: Obstetrics;  Laterality: N/A;  Primary Cesarean Section   FOOT SURGERY  as child   arch corrections    Family History  Problem Relation Age of Onset   Diabetes Mellitus II Mother    Diabetes Father    Diabetes Sister    Diabetes Maternal Grandmother    Diabetes Maternal Grandfather    Diabetes Paternal Grandmother     Social History   Socioeconomic History   Marital status: Significant Other    Spouse name: Not on file   Number of children: Not on file   Years of education: Not on file   Highest education level: 12th grade  Occupational History   Not on file  Tobacco Use   Smoking status: Never   Smokeless tobacco: Never  Vaping Use   Vaping status: Never Used  Substance and Sexual Activity   Alcohol use: No   Drug use:  Not Currently    Types: Marijuana   Sexual activity: Yes  Other Topics Concern   Not on file  Social History Narrative   Not on file   Social Determinants of Health   Financial Resource Strain: High Risk (03/06/2023)   Overall Financial Resource Strain (CARDIA)    Difficulty of Paying Living Expenses: Hard  Food Insecurity: Food Insecurity Present (03/06/2023)   Hunger Vital Sign    Worried About Running Out of Food in the Last Year: Sometimes true    Ran Out of Food in the Last Year: Never true  Transportation Needs: No Transportation Needs (03/06/2023)   PRAPARE - Administrator, Civil Service (Medical): No    Lack of Transportation (Non-Medical): No  Physical Activity: Inactive (03/06/2023)   Exercise Vital Sign    Days of Exercise per Week: 0 days    Minutes of Exercise per Session: 10 min  Stress: No Stress Concern Present (03/06/2023)   [employer] of Occupational Health - Occupational Stress Questionnaire    Feeling of Stress : Only a little  Social Connections: Moderately Isolated (03/06/2023)   Social Connection and Isolation Panel [NHANES]    Frequency of Communication with Friends and Family: More than three times a week    Frequency of Social Gatherings with Friends and Family: Once a week    Attends Religious Services: Never    Production manager of  Clubs or Organizations: No    Attends Engineer, structural: Not on file    Marital Status: Living with partner  Intimate Partner Violence: Not on file    Outpatient Medications Prior to Visit  Medication Sig Dispense Refill   fluticasone (FLONASE) 50 MCG/ACT nasal spray Place 2 sprays into both nostrils daily. 16 g 0   naproxen (NAPROSYN) 500 MG tablet Take 1 tablet (500 mg total) by mouth 2 (two) times daily with a meal. 60 tablet 1   Vitamin D, Ergocalciferol, (DRISDOL) 1.25 MG (50000 UNIT) CAPS capsule TAKE 1 CAPSULE BY MOUTH EVERY 7 DAYS 5 capsule 2   baclofen (LIORESAL) 10 MG tablet Take 1  tablet (10 mg total) by mouth 3 (three) times daily. 90 each 0   brompheniramine-pseudoephedrine-DM 30-2-10 MG/5ML syrup Take 5 mLs by mouth 3 (three) times daily as needed. 120 mL 0   cephALEXin (KEFLEX) 500 MG capsule Take 1 capsule (500 mg total) by mouth 4 (four) times daily. 20 capsule 0   ondansetron (ZOFRAN-ODT) 4 MG disintegrating tablet Take 1 tablet (4 mg total) by mouth every 8 (eight) hours as needed for nausea or vomiting. 20 tablet 0   predniSONE (STERAPRED UNI-PAK 21 TAB) 10 MG (21) TBPK tablet Prednisone 10 mg- 6 day dose pack as directed. 1 each 0   No facility-administered medications prior to visit.    Allergies  Allergen Reactions   Bee Venom Hives, Shortness Of Breath, Swelling and Rash   Mango Flavor Hives, Itching and Rash   Peanut-Containing Drug Products Hives, Itching and Rash   Penicillins Rash    Has patient had a PCN reaction causing immediate rash, facial/tongue/throat swelling, SOB or lightheadedness with hypotension: No Has patient had a PCN reaction causing severe rash involving mucus membranes or skin necrosis: No Has patient had a PCN reaction that required hospitalization: No Has patient had a PCN reaction occurring within the last 10 years: No If all of the above answers are "NO", then may proceed with Cephalosporin use.     ROS Review of Systems  Constitutional:  Negative for chills and fever.  HENT:  Positive for sneezing.   Eyes:  Negative for visual disturbance.  Respiratory:  Negative for cough, chest tightness and shortness of breath.   Neurological:  Negative for dizziness and headaches.      Objective:    Physical Exam HENT:     Head: Normocephalic.     Mouth/Throat:     Mouth: Mucous membranes are moist.  Cardiovascular:     Rate and Rhythm: Normal rate.     Heart sounds: Normal heart sounds.  Pulmonary:     Effort: Pulmonary effort is normal.     Breath sounds: Normal breath sounds.  Neurological:     Mental Status: She is  alert.     BP 109/75   Pulse 80   Ht 5\' 4"  (1.626 m)   Wt 202 lb (91.6 kg)   SpO2 98%   BMI 34.67 kg/m  Wt Readings from Last 3 Encounters:  03/06/23 202 lb (91.6 kg)  01/16/23 201 lb 0.6 oz (91.2 kg)  11/12/22 201 lb (91.2 kg)    Lab Results  Component Value Date   TSH 1.340 01/23/2023   Lab Results  Component Value Date   WBC 5.3 01/23/2023   HGB 14.6 01/23/2023   HCT 44.3 01/23/2023   MCV 94 01/23/2023   PLT 332 01/23/2023   Lab Results  Component Value Date   NA  139 01/23/2023   K 4.6 01/23/2023   CO2 27 01/23/2023   GLUCOSE 98 01/23/2023   BUN 7 01/23/2023   CREATININE 0.85 01/23/2023   BILITOT 0.5 01/23/2023   ALKPHOS 68 01/23/2023   AST 17 01/23/2023   ALT 15 01/23/2023   PROT 6.9 01/23/2023   ALBUMIN 4.5 01/23/2023   CALCIUM 9.9 01/23/2023   ANIONGAP 8 11/12/2022   EGFR 90 01/23/2023   Lab Results  Component Value Date   CHOL 215 (H) 01/23/2023   Lab Results  Component Value Date   HDL 37 (L) 01/23/2023   Lab Results  Component Value Date   LDLCALC 150 (H) 01/23/2023   Lab Results  Component Value Date   TRIG 155 (H) 01/23/2023   Lab Results  Component Value Date   CHOLHDL 5.8 (H) 01/23/2023   Lab Results  Component Value Date   HGBA1C 5.8 (H) 01/23/2023      Assessment & Plan:  Seasonal allergic rhinitis, unspecified trigger Assessment & Plan: Encouraged to start taking Xyzal 5 mg at bedtime and Singulair 10 mg daily for better control of your allergic rhinitis symptoms. Encouraged to increase her intake of anti-inflammatory foods rich in omega-3 fatty acids and antioxidant shots as fish, nuts, fruits and vegetables to reduce inflammation  Orders: -     Montelukast Sodium; Take 1 tablet (10 mg total) by mouth at bedtime.  Dispense: 30 tablet; Refill: 3 -     Levocetirizine Dihydrochloride; Take 1 tablet (5 mg total) by mouth every evening.  Dispense: 30 tablet; Refill: 2   Note: This chart has been completed using Licensed conveyancer software, and while attempts have been made to ensure accuracy, certain words and phrases may not be transcribed as intended.   Follow-up: Return in about 4 months (around 07/04/2023).   Gilmore Laroche, FNP

## 2023-03-06 NOTE — Patient Instructions (Addendum)
I appreciate the opportunity to provide care to you today!    Follow up:  4 months  Allergic Rhinitis Management: Start taking Xyzal 5 mg at bedtime and Singulair 10 mg daily for better control of your allergic rhinitis symptoms.    Attached with your AVS, you will find valuable resources for self-education. I highly recommend dedicating some time to thoroughly examine them.   Please continue to a heart-healthy diet and increase your physical activities. Try to exercise for at least five days a week.    It was a pleasure to see you and I look forward to continuing to work together on your health and well-being. Please do not hesitate to call the office if you need care or have questions about your care.  In case of emergency, please visit the Emergency Department for urgent care, or contact our clinic at [phone] to schedule an appointment. We're here to help you!   Have a wonderful day and week. With Gratitude, Gilmore Laroche MSN, FNP-BC

## 2023-03-10 DIAGNOSIS — Z419 Encounter for procedure for purposes other than remedying health state, unspecified: Secondary | ICD-10-CM | POA: Diagnosis not present

## 2023-04-10 DIAGNOSIS — Z419 Encounter for procedure for purposes other than remedying health state, unspecified: Secondary | ICD-10-CM | POA: Diagnosis not present

## 2023-04-14 ENCOUNTER — Emergency Department (HOSPITAL_COMMUNITY): Payer: Medicaid Other

## 2023-04-14 ENCOUNTER — Other Ambulatory Visit: Payer: Self-pay

## 2023-04-14 ENCOUNTER — Encounter (HOSPITAL_COMMUNITY): Payer: Self-pay

## 2023-04-14 ENCOUNTER — Emergency Department (HOSPITAL_COMMUNITY)
Admission: EM | Admit: 2023-04-14 | Discharge: 2023-04-14 | Disposition: A | Discharge status: Home / Self Care | Payer: Medicaid Other | Attending: Emergency Medicine | Admitting: Emergency Medicine

## 2023-04-14 DIAGNOSIS — K429 Umbilical hernia without obstruction or gangrene: Secondary | ICD-10-CM | POA: Diagnosis not present

## 2023-04-14 DIAGNOSIS — N83202 Unspecified ovarian cyst, left side: Secondary | ICD-10-CM | POA: Diagnosis not present

## 2023-04-14 DIAGNOSIS — R1084 Generalized abdominal pain: Secondary | ICD-10-CM | POA: Diagnosis not present

## 2023-04-14 DIAGNOSIS — R1033 Periumbilical pain: Secondary | ICD-10-CM | POA: Diagnosis present

## 2023-04-14 DIAGNOSIS — R300 Dysuria: Secondary | ICD-10-CM | POA: Insufficient documentation

## 2023-04-14 DIAGNOSIS — R109 Unspecified abdominal pain: Secondary | ICD-10-CM | POA: Diagnosis not present

## 2023-04-14 DIAGNOSIS — Z91010 Allergy to peanuts: Secondary | ICD-10-CM | POA: Insufficient documentation

## 2023-04-14 DIAGNOSIS — N730 Acute parametritis and pelvic cellulitis: Secondary | ICD-10-CM

## 2023-04-14 DIAGNOSIS — N739 Female pelvic inflammatory disease, unspecified: Secondary | ICD-10-CM | POA: Diagnosis not present

## 2023-04-14 HISTORY — DX: Sciatica, unspecified side: M54.30

## 2023-04-14 LAB — URINALYSIS, ROUTINE W REFLEX MICROSCOPIC
Bacteria, UA: NONE SEEN
Bilirubin Urine: NEGATIVE
Glucose, UA: NEGATIVE mg/dL
Hgb urine dipstick: NEGATIVE
Ketones, ur: NEGATIVE mg/dL
Nitrite: NEGATIVE
Protein, ur: NEGATIVE mg/dL
Specific Gravity, Urine: 1.01 (ref 1.005–1.030)
pH: 6 (ref 5.0–8.0)

## 2023-04-14 LAB — CBC WITH DIFFERENTIAL/PLATELET
Abs Immature Granulocytes: 0.02 10*3/uL (ref 0.00–0.07)
Basophils Absolute: 0 10*3/uL (ref 0.0–0.1)
Basophils Relative: 1 %
Eosinophils Absolute: 0.2 10*3/uL (ref 0.0–0.5)
Eosinophils Relative: 3 %
HCT: 42.2 % (ref 36.0–46.0)
Hemoglobin: 14.6 g/dL (ref 12.0–15.0)
Immature Granulocytes: 0 %
Lymphocytes Relative: 28 %
Lymphs Abs: 1.9 10*3/uL (ref 0.7–4.0)
MCH: 31.7 pg (ref 26.0–34.0)
MCHC: 34.6 g/dL (ref 30.0–36.0)
MCV: 91.7 fL (ref 80.0–100.0)
Monocytes Absolute: 0.4 10*3/uL (ref 0.1–1.0)
Monocytes Relative: 6 %
Neutro Abs: 4.2 10*3/uL (ref 1.7–7.7)
Neutrophils Relative %: 62 %
Platelets: 327 10*3/uL (ref 150–400)
RBC: 4.6 MIL/uL (ref 3.87–5.11)
RDW: 12.5 % (ref 11.5–15.5)
WBC: 6.6 10*3/uL (ref 4.0–10.5)
nRBC: 0 % (ref 0.0–0.2)

## 2023-04-14 LAB — COMPREHENSIVE METABOLIC PANEL
ALT: 15 U/L (ref 0–44)
AST: 16 U/L (ref 15–41)
Albumin: 4 g/dL (ref 3.5–5.0)
Alkaline Phosphatase: 54 U/L (ref 38–126)
Anion gap: 9 (ref 5–15)
BUN: 5 mg/dL — ABNORMAL LOW (ref 6–20)
CO2: 27 mmol/L (ref 22–32)
Calcium: 9.5 mg/dL (ref 8.9–10.3)
Chloride: 99 mmol/L (ref 98–111)
Creatinine, Ser: 0.71 mg/dL (ref 0.44–1.00)
GFR, Estimated: >60 mL/min (ref >60)
Glucose, Bld: 120 mg/dL — ABNORMAL HIGH (ref 70–99)
Potassium: 3.6 mmol/L (ref 3.5–5.1)
Sodium: 135 mmol/L (ref 135–145)
Total Bilirubin: 0.6 mg/dL (ref 0.0–1.2)
Total Protein: 7.7 g/dL (ref 6.5–8.1)

## 2023-04-14 LAB — WET PREP, GENITAL
Sperm: NONE SEEN
Trich, Wet Prep: NONE SEEN
WBC, Wet Prep HPF POC: >=10 — AB (ref <10)
Yeast Wet Prep HPF POC: NONE SEEN

## 2023-04-14 LAB — POC URINE PREG, ED: Preg Test, Ur: NEGATIVE

## 2023-04-14 LAB — LIPASE, BLOOD: Lipase: 38 U/L (ref 11–51)

## 2023-04-14 LAB — MAGNESIUM: Magnesium: 1.7 mg/dL (ref 1.7–2.4)

## 2023-04-14 MED ORDER — LACTATED RINGERS IV BOLUS
1000.0000 mL | Freq: Once | INTRAVENOUS | Status: AC
  Administered 2023-04-14: 1000 mL via INTRAVENOUS

## 2023-04-14 MED ORDER — SODIUM CHLORIDE 0.9 % IV SOLN
1.0000 g | Freq: Once | INTRAVENOUS | Status: AC
  Administered 2023-04-14: 1 g via INTRAVENOUS
  Filled 2023-04-14: qty 10

## 2023-04-14 MED ORDER — METRONIDAZOLE 500 MG PO TABS: 500.0000 mg | ORAL_TABLET | Freq: Two times a day (BID) | ORAL | 0 refills | Status: DC

## 2023-04-14 MED ORDER — OXYCODONE-ACETAMINOPHEN 5-325 MG PO TABS: 1.0000 | ORAL_TABLET | Freq: Four times a day (QID) | ORAL | 0 refills | Status: DC | PRN

## 2023-04-14 MED ORDER — ONDANSETRON HCL 4 MG/2ML IJ SOLN
4.0000 mg | Freq: Once | INTRAMUSCULAR | Status: AC
  Administered 2023-04-14: 4 mg via INTRAVENOUS
  Filled 2023-04-14: qty 2

## 2023-04-14 MED ORDER — ONDANSETRON 4 MG PO TBDP: 4.0000 mg | ORAL_TABLET | Freq: Three times a day (TID) | ORAL | 0 refills | Status: DC | PRN

## 2023-04-14 MED ORDER — SODIUM CHLORIDE 0.9 % IV BOLUS
1000.0000 mL | Freq: Once | INTRAVENOUS | Status: AC
Start: 2023-04-14 — End: 2023-04-14
  Administered 2023-04-14: 1000 mL via INTRAVENOUS

## 2023-04-14 MED ORDER — KETOROLAC TROMETHAMINE 15 MG/ML IJ SOLN
15.0000 mg | Freq: Once | INTRAMUSCULAR | Status: AC
  Administered 2023-04-14: 15 mg via INTRAVENOUS
  Filled 2023-04-14: qty 1

## 2023-04-14 MED ORDER — HYDROMORPHONE HCL 1 MG/ML IJ SOLN
1.0000 mg | Freq: Once | INTRAMUSCULAR | Status: AC
  Administered 2023-04-14: 1 mg via INTRAVENOUS
  Filled 2023-04-14: qty 1

## 2023-04-14 MED ORDER — IOHEXOL 300 MG/ML  SOLN
100.0000 mL | Freq: Once | INTRAMUSCULAR | Status: AC | PRN
  Administered 2023-04-14: 100 mL via INTRAVENOUS

## 2023-04-14 MED ORDER — DOXYCYCLINE HYCLATE 100 MG PO CAPS: 100.0000 mg | ORAL_CAPSULE | Freq: Two times a day (BID) | ORAL | 0 refills | Status: DC

## 2023-04-14 NOTE — ED Provider Notes (Signed)
 [facility] Provider Note   CSN: [id number] Arrival date & time: 04/14/23  9394     History  Chief Complaint  Patient presents with   Abdominal Pain    [name] is a 38 y.o. female.  HPI Patient presents for abdominal pain.  Medical history includes seasonal allergies.  Yesterday, she had some mild abdominal discomfort.  She denies any associated nausea.  She had 2 normal bowel movements yesterday.  LMP was 2 weeks ago.  This morning, shortly prior to arrival, pain worsened in severity.  She describes the location as periumbilical and across her lower abdomen.  Pain is worsened with any palpation or even bumps during the drive to the hospital.  She denies any recent vaginal bleeding or discharge.  She has had some mild discomfort with urination.    Home Medications Prior to Admission medications   Medication Sig Start Date End Date Taking? Authorizing Provider  fluticasone  (FLONASE ) 50 MCG/ACT nasal spray Place 2 sprays into both nostrils daily. 11/03/22   Vivienne Delon HERO, PA-C  levocetirizine (XYZAL ) 5 MG tablet Take 1 tablet (5 mg total) by mouth every evening. 03/06/23   Zarwolo, Gloria, FNP  montelukast  (SINGULAIR ) 10 MG tablet Take 1 tablet (10 mg total) by mouth at bedtime. 03/06/23   Zarwolo, Gloria, FNP  naproxen  (NAPROSYN ) 500 MG tablet Take 1 tablet (500 mg total) by mouth 2 (two) times daily with a meal. 02/11/23   Zarwolo, Gloria, FNP  Vitamin D , Ergocalciferol , (DRISDOL ) 1.25 MG (50000 UNIT) CAPS capsule TAKE 1 CAPSULE BY MOUTH EVERY 7 DAYS 11/14/22   Zarwolo, Gloria, FNP      Allergies    Bee venom, Mango flavoring agent (non-screening), Peanut-containing drug products, and Penicillins    Review of Systems   Review of Systems  Gastrointestinal:  Positive for abdominal pain.  All other systems reviewed and are negative.   Physical Exam Updated Vital Signs BP 133/65   Pulse (!) 107   Temp 100.2 F (37.9 C)  (Oral)   Resp 18   Ht 5' 4 (1.626 m)   Wt 92 kg   LMP 03/24/2023 (Exact Date)   SpO2 91%   BMI 34.81 kg/m  Physical Exam Vitals and nursing note reviewed.  Constitutional:      General: She is not in acute distress.    Appearance: She is well-developed. She is not ill-appearing, toxic-appearing or diaphoretic.  HENT:     Head: Normocephalic and atraumatic.     Mouth/Throat:     Mouth: Mucous membranes are moist.  Eyes:     Extraocular Movements: Extraocular movements intact.     Conjunctiva/sclera: Conjunctivae normal.  Cardiovascular:     Rate and Rhythm: Normal rate and regular rhythm.  Pulmonary:     Effort: Pulmonary effort is normal. No respiratory distress.  Abdominal:     Palpations: Abdomen is soft.     Tenderness: There is generalized abdominal tenderness. There is no guarding or rebound.  Musculoskeletal:        General: No swelling.     Cervical back: Neck supple.  Skin:    General: Skin is warm and dry.     Coloration: Skin is not cyanotic, jaundiced or pale.  Neurological:     General: No focal deficit present.     Mental Status: She is alert and oriented to person, place, and time.  Psychiatric:        Mood and Affect: Mood is anxious.  Affect is tearful.        Speech: Speech normal.        Behavior: Behavior normal. Behavior is cooperative.     ED Results / Procedures / Treatments   Labs (all labs ordered are listed, but only abnormal results are displayed) Labs Reviewed  COMPREHENSIVE METABOLIC PANEL - Abnormal; Notable for the following components:      Result Value   Glucose, Bld 120 (*)    BUN 5 (*)    All other components within normal limits  LIPASE, BLOOD  CBC WITH DIFFERENTIAL/PLATELET  MAGNESIUM   URINALYSIS, ROUTINE W REFLEX MICROSCOPIC  POC URINE PREG, ED    EKG None  Radiology No results found.  Procedures Procedures    Medications Ordered in ED Medications  HYDROmorphone  (DILAUDID ) injection 1 mg (1 mg Intravenous  Given 04/14/23 0629)  lactated ringers  bolus 1,000 mL (0 mLs Intravenous Stopped 04/14/23 9271)    ED Course/ Medical Decision Making/ A&P                                 Medical Decision Making Amount and/or Complexity of Data Reviewed Labs: ordered. Radiology: ordered.  Risk Prescription drug management.   This patient presents to the ED for concern of abdominal pain, this involves an extensive number of treatment options, and is a complaint that carries with it a high risk of complications and morbidity.  The differential diagnosis includes colitis, appendicitis, PID, constipation, SBO   Co morbidities that complicate the patient evaluation  Seasonal allergies   Additional history obtained:  Additional history obtained from patient's significant other External records from outside source obtained and reviewed including EMR   Lab Tests:  I Ordered, and personally interpreted labs.  The pertinent results include: Normal hemoglobin, no leukocytosis, normal kidney function, normal electrolytes, normal hepatobiliary enzymes   Imaging Studies ordered:  I ordered imaging studies including CT of abdomen and pelvis; pelvic ultrasound I independently visualized and interpreted imaging which showed (pending at time of signout) I agree with the radiologist interpretation   Cardiac Monitoring: / EKG:  The patient was maintained on a cardiac monitor.  I personally viewed and interpreted the cardiac monitored which showed an underlying rhythm of: Sinus rhythm  Problem List / ED Course / Critical interventions / Medication management  Patient presents for abdominal pain.  Although pain was present and mild yesterday, it worsened in severity this morning.  On arrival, patient is tearful and appears quite uncomfortable.  Abdomen is tender, even to light palpation.  Vital signs notable for low-grade temperature of 100.2 degrees.  She denies any prior abdominal surgeries.  She has not had  any recent vaginal bleeding or discharge.  Dilaudid  was ordered for analgesia.  Workup was initiated.  Initial lab work is unremarkable.  CT scan and pelvic ultrasound pending at time of signout.  Care of patient was signed out to oncoming ED provider. I ordered medication including IV fluids for hydration; Dilaudid  for analgesia Reevaluation of the patient after these medicines showed that the patient improved I have reviewed the patients home medicines and have made adjustments as needed   Social Determinants of Health:  Has PCP        Final Clinical Impression(s) / ED Diagnoses Final diagnoses:  Generalized abdominal pain    Rx / DC Orders ED Discharge Orders     None         Melvenia Motto,  MD 04/14/23 9267

## 2023-04-14 NOTE — ED Provider Notes (Signed)
  Physical Exam  BP 138/78 (BP Location: Right Arm)   Pulse 95   Temp (!) 101.2 F (38.4 C) (Oral)   Resp 18   Ht 5' 4 (1.626 m)   Wt 92 kg   LMP 03/24/2023 (Exact Date)   SpO2 96%   BMI 34.81 kg/m   Physical Exam  Procedures  Procedures  ED Course / MDM    Medical Decision Making Amount and/or Complexity of Data Reviewed Labs: ordered. Radiology: ordered.  Risk Prescription drug management.   Received in signout.  Pelvic pain.  Fever.  Ultrasound done after CT scan showed potentially cyst or hydrosalpinx.  White count reassuring.  Did develop fever of 101.2 here.  Pelvic exam done and did have some discharge.  White count is elevated.  Potential bacterial vaginosis.  Did not have cervical motion or adnexal tenderness however.  Ultrasound did not show tubo-ovarian abscess.  Urine does not Nestl show infection.  Will treat for PID.  Culture sent.  Discharge home.       Patsey Lot, MD 04/14/23 1323

## 2023-04-14 NOTE — ED Notes (Signed)
 Ultrasound complete, chaperoned.

## 2023-04-14 NOTE — ED Triage Notes (Signed)
 Pt arrived via POV c/o generalized abdominal pain that woke her from sleep at apprx 0300 today.

## 2023-04-14 NOTE — ED Notes (Signed)
 ED Provider at bedside.

## 2023-04-15 ENCOUNTER — Other Ambulatory Visit: Payer: Self-pay

## 2023-04-15 ENCOUNTER — Inpatient Hospital Stay (HOSPITAL_COMMUNITY)
Admission: EM | Admit: 2023-04-15 | Discharge: 2023-04-19 | DRG: 872 | Disposition: A | Discharge status: Home / Self Care | Payer: Medicaid Other | Attending: Family Medicine | Admitting: Family Medicine

## 2023-04-15 ENCOUNTER — Encounter (HOSPITAL_COMMUNITY): Payer: Self-pay

## 2023-04-15 DIAGNOSIS — E669 Obesity, unspecified: Secondary | ICD-10-CM | POA: Diagnosis present

## 2023-04-15 DIAGNOSIS — J42 Unspecified chronic bronchitis: Secondary | ICD-10-CM | POA: Diagnosis present

## 2023-04-15 DIAGNOSIS — N739 Female pelvic inflammatory disease, unspecified: Secondary | ICD-10-CM | POA: Diagnosis not present

## 2023-04-15 DIAGNOSIS — M5430 Sciatica, unspecified side: Secondary | ICD-10-CM | POA: Diagnosis present

## 2023-04-15 DIAGNOSIS — E876 Hypokalemia: Secondary | ICD-10-CM | POA: Diagnosis present

## 2023-04-15 DIAGNOSIS — Z833 Family history of diabetes mellitus: Secondary | ICD-10-CM

## 2023-04-15 DIAGNOSIS — E871 Hypo-osmolality and hyponatremia: Secondary | ICD-10-CM | POA: Diagnosis present

## 2023-04-15 DIAGNOSIS — Z6834 Body mass index (BMI) 34.0-34.9, adult: Secondary | ICD-10-CM

## 2023-04-15 DIAGNOSIS — Z91030 Bee allergy status: Secondary | ICD-10-CM

## 2023-04-15 DIAGNOSIS — E66811 Obesity, class 1: Secondary | ICD-10-CM | POA: Diagnosis present

## 2023-04-15 DIAGNOSIS — Z79899 Other long term (current) drug therapy: Secondary | ICD-10-CM

## 2023-04-15 DIAGNOSIS — A419 Sepsis, unspecified organism: Principal | ICD-10-CM | POA: Diagnosis present

## 2023-04-15 DIAGNOSIS — Z91010 Allergy to peanuts: Secondary | ICD-10-CM

## 2023-04-15 DIAGNOSIS — N7093 Salpingitis and oophoritis, unspecified: Secondary | ICD-10-CM | POA: Diagnosis present

## 2023-04-15 DIAGNOSIS — N83202 Unspecified ovarian cyst, left side: Secondary | ICD-10-CM | POA: Diagnosis present

## 2023-04-15 DIAGNOSIS — Z880 Allergy status to penicillin: Secondary | ICD-10-CM

## 2023-04-15 DIAGNOSIS — N730 Acute parametritis and pelvic cellulitis: Principal | ICD-10-CM | POA: Diagnosis present

## 2023-04-15 DIAGNOSIS — Z5986 Financial insecurity: Secondary | ICD-10-CM

## 2023-04-15 DIAGNOSIS — Z9109 Other allergy status, other than to drugs and biological substances: Secondary | ICD-10-CM

## 2023-04-15 DIAGNOSIS — R7303 Prediabetes: Secondary | ICD-10-CM | POA: Diagnosis present

## 2023-04-15 LAB — COMPREHENSIVE METABOLIC PANEL
ALT: 12 U/L (ref 0–44)
AST: 12 U/L — ABNORMAL LOW (ref 15–41)
Albumin: 3.5 g/dL (ref 3.5–5.0)
Alkaline Phosphatase: 58 U/L (ref 38–126)
Anion gap: 9 (ref 5–15)
BUN: <5 mg/dL — ABNORMAL LOW (ref 6–20)
CO2: 26 mmol/L (ref 22–32)
Calcium: 8.9 mg/dL (ref 8.9–10.3)
Chloride: 98 mmol/L (ref 98–111)
Creatinine, Ser: 0.79 mg/dL (ref 0.44–1.00)
GFR, Estimated: >60 mL/min (ref >60)
Glucose, Bld: 94 mg/dL (ref 70–99)
Potassium: 3 mmol/L — ABNORMAL LOW (ref 3.5–5.1)
Sodium: 133 mmol/L — ABNORMAL LOW (ref 135–145)
Total Bilirubin: 1.2 mg/dL (ref 0.0–1.2)
Total Protein: 7.5 g/dL (ref 6.5–8.1)

## 2023-04-15 LAB — CBC
HCT: 39.6 % (ref 36.0–46.0)
Hemoglobin: 13.8 g/dL (ref 12.0–15.0)
MCH: 32 pg (ref 26.0–34.0)
MCHC: 34.8 g/dL (ref 30.0–36.0)
MCV: 91.9 fL (ref 80.0–100.0)
Platelets: 321 10*3/uL (ref 150–400)
RBC: 4.31 MIL/uL (ref 3.87–5.11)
RDW: 12.6 % (ref 11.5–15.5)
WBC: 15.1 10*3/uL — ABNORMAL HIGH (ref 4.0–10.5)
nRBC: 0 % (ref 0.0–0.2)

## 2023-04-15 LAB — HIV ANTIBODY (ROUTINE TESTING W REFLEX): HIV Screen 4th Generation wRfx: NONREACTIVE

## 2023-04-15 LAB — GC/CHLAMYDIA PROBE AMP (~~LOC~~) NOT AT ARMC
Chlamydia: NEGATIVE
Comment: NEGATIVE
Comment: NORMAL
Neisseria Gonorrhea: NEGATIVE

## 2023-04-15 LAB — LIPASE, BLOOD: Lipase: 21 U/L (ref 11–51)

## 2023-04-15 LAB — RPR: RPR Ser Ql: NONREACTIVE

## 2023-04-15 MED ORDER — ACETAMINOPHEN 325 MG PO TABS
650.0000 mg | ORAL_TABLET | Freq: Once | ORAL | Status: AC | PRN
  Administered 2023-04-15: 650 mg via ORAL
  Filled 2023-04-15: qty 2

## 2023-04-15 NOTE — ED Triage Notes (Signed)
 Pt c/o generalized abdominal pain. Pt seen last night and picked up her prescription but the pain is not improving.

## 2023-04-15 NOTE — Patient Instructions (Signed)

## 2023-04-15 NOTE — Progress Notes (Deleted)
   Established Patient Office Visit   Subjective  Patient ID: [name], female    DOB: Dec 30, 1985  Age: 38 y.o. MRN: [id number]  No chief complaint on file.   She  has a past medical history of No pertinent past medical history and Sciatica.  HPI Patient presents to the clinic for ED follow up.  Patient presented to the ED with worsening abdominal pain, which started as mild discomfort on 04/14/23 which became more severe The pain was described as periumbilical and across the lower abdomen, aggravated by palpation and movement. The patient denies nausea, vomiting, or vaginal bleeding, but reports mild urinary discomfort. Her medical history is notable for seasonal allergies. CT findings revealed a left adnexal mass, which is suspicious for a ruptured ovarian cyst or pelvic inflammatory disease (PID), with a differential including ovarian torsion and ectopic pregnancy. Additionally, there is a possible hydrosalpinx on the right, and the appendix appears normal with no signs of bowel obstruction or perforation. A broad-based umbilical hernia is present, containing only fat without complications. Pelvic ultrasound showed that the left ovarian cyst is likely a corpus luteum, and a follow-up ultrasound in 6-12 weeks is recommended to monitor its resolution. Small free fluid was also noted in the pelvis.  ROS    Objective:     LMP 03/24/2023 (Exact Date)  {Vitals History (Optional):23777}  Physical Exam   No results found for any visits on 04/16/23.  The ASCVD Risk score (Arnett DK, et al., 2019) failed to calculate for the following reasons:   The 2019 ASCVD risk score is only valid for ages 79 to 13    Assessment & Plan:  There are no diagnoses linked to this encounter.  No follow-ups on file.   Hilario Kidd Wilhelmena Falter, FNP

## 2023-04-16 ENCOUNTER — Emergency Department (HOSPITAL_COMMUNITY): Payer: Medicaid Other

## 2023-04-16 ENCOUNTER — Encounter (HOSPITAL_COMMUNITY): Payer: Self-pay | Admitting: Internal Medicine

## 2023-04-16 ENCOUNTER — Inpatient Hospital Stay (HOSPITAL_COMMUNITY)
Admit: 2023-04-16 | Discharge: 2023-04-16 | Disposition: A | Discharge status: Home / Self Care | Payer: Medicaid Other | Attending: Internal Medicine

## 2023-04-16 ENCOUNTER — Encounter (INDEPENDENT_AMBULATORY_CARE_PROVIDER_SITE_OTHER): Payer: Self-pay | Admitting: Family Medicine

## 2023-04-16 ENCOUNTER — Other Ambulatory Visit: Payer: Self-pay

## 2023-04-16 DIAGNOSIS — M5430 Sciatica, unspecified side: Secondary | ICD-10-CM | POA: Diagnosis present

## 2023-04-16 DIAGNOSIS — N739 Female pelvic inflammatory disease, unspecified: Secondary | ICD-10-CM | POA: Diagnosis not present

## 2023-04-16 DIAGNOSIS — N83202 Unspecified ovarian cyst, left side: Secondary | ICD-10-CM

## 2023-04-16 DIAGNOSIS — R109 Unspecified abdominal pain: Secondary | ICD-10-CM | POA: Diagnosis not present

## 2023-04-16 DIAGNOSIS — Z5986 Financial insecurity: Secondary | ICD-10-CM | POA: Diagnosis not present

## 2023-04-16 DIAGNOSIS — N7093 Salpingitis and oophoritis, unspecified: Secondary | ICD-10-CM | POA: Diagnosis not present

## 2023-04-16 DIAGNOSIS — J42 Unspecified chronic bronchitis: Secondary | ICD-10-CM | POA: Diagnosis present

## 2023-04-16 DIAGNOSIS — Z91030 Bee allergy status: Secondary | ICD-10-CM | POA: Diagnosis not present

## 2023-04-16 DIAGNOSIS — Z880 Allergy status to penicillin: Secondary | ICD-10-CM | POA: Diagnosis not present

## 2023-04-16 DIAGNOSIS — Z833 Family history of diabetes mellitus: Secondary | ICD-10-CM | POA: Diagnosis not present

## 2023-04-16 DIAGNOSIS — Z6834 Body mass index (BMI) 34.0-34.9, adult: Secondary | ICD-10-CM | POA: Diagnosis not present

## 2023-04-16 DIAGNOSIS — E876 Hypokalemia: Secondary | ICD-10-CM | POA: Diagnosis not present

## 2023-04-16 DIAGNOSIS — N838 Other noninflammatory disorders of ovary, fallopian tube and broad ligament: Secondary | ICD-10-CM | POA: Diagnosis not present

## 2023-04-16 DIAGNOSIS — A419 Sepsis, unspecified organism: Secondary | ICD-10-CM | POA: Diagnosis present

## 2023-04-16 DIAGNOSIS — R7303 Prediabetes: Secondary | ICD-10-CM | POA: Diagnosis present

## 2023-04-16 DIAGNOSIS — Z91010 Allergy to peanuts: Secondary | ICD-10-CM | POA: Diagnosis not present

## 2023-04-16 DIAGNOSIS — E871 Hypo-osmolality and hyponatremia: Secondary | ICD-10-CM | POA: Diagnosis not present

## 2023-04-16 DIAGNOSIS — N730 Acute parametritis and pelvic cellulitis: Secondary | ICD-10-CM | POA: Diagnosis present

## 2023-04-16 DIAGNOSIS — E66811 Obesity, class 1: Secondary | ICD-10-CM | POA: Diagnosis present

## 2023-04-16 DIAGNOSIS — Z79899 Other long term (current) drug therapy: Secondary | ICD-10-CM | POA: Diagnosis not present

## 2023-04-16 DIAGNOSIS — Z9109 Other allergy status, other than to drugs and biological substances: Secondary | ICD-10-CM | POA: Diagnosis not present

## 2023-04-16 DIAGNOSIS — E669 Obesity, unspecified: Secondary | ICD-10-CM | POA: Diagnosis present

## 2023-04-16 LAB — BASIC METABOLIC PANEL
Anion gap: 10 (ref 5–15)
BUN: 5 mg/dL — ABNORMAL LOW (ref 6–20)
CO2: 28 mmol/L (ref 22–32)
Calcium: 8.9 mg/dL (ref 8.9–10.3)
Chloride: 95 mmol/L — ABNORMAL LOW (ref 98–111)
Creatinine, Ser: 0.73 mg/dL (ref 0.44–1.00)
GFR, Estimated: >60 mL/min (ref >60)
Glucose, Bld: 76 mg/dL (ref 70–99)
Potassium: 2.9 mmol/L — ABNORMAL LOW (ref 3.5–5.1)
Sodium: 133 mmol/L — ABNORMAL LOW (ref 135–145)

## 2023-04-16 LAB — URINALYSIS, ROUTINE W REFLEX MICROSCOPIC
Bilirubin Urine: NEGATIVE
Glucose, UA: NEGATIVE mg/dL
Hgb urine dipstick: NEGATIVE
Ketones, ur: 80 mg/dL — AB
Nitrite: NEGATIVE
Protein, ur: NEGATIVE mg/dL
Specific Gravity, Urine: 1.015 (ref 1.005–1.030)
pH: 6 (ref 5.0–8.0)

## 2023-04-16 LAB — CBC
HCT: 38.2 % (ref 36.0–46.0)
Hemoglobin: 13 g/dL (ref 12.0–15.0)
MCH: 31.6 pg (ref 26.0–34.0)
MCHC: 34 g/dL (ref 30.0–36.0)
MCV: 92.7 fL (ref 80.0–100.0)
Platelets: 325 10*3/uL (ref 150–400)
RBC: 4.12 MIL/uL (ref 3.87–5.11)
RDW: 12.8 % (ref 11.5–15.5)
WBC: 14.1 10*3/uL — ABNORMAL HIGH (ref 4.0–10.5)
nRBC: 0 % (ref 0.0–0.2)

## 2023-04-16 LAB — MAGNESIUM: Magnesium: 1.6 mg/dL — ABNORMAL LOW (ref 1.7–2.4)

## 2023-04-16 LAB — LACTIC ACID, PLASMA: Lactic Acid, Venous: 1 mmol/L (ref 0.5–1.9)

## 2023-04-16 LAB — PHOSPHORUS: Phosphorus: 3 mg/dL (ref 2.5–4.6)

## 2023-04-16 LAB — PREGNANCY, URINE: Preg Test, Ur: NEGATIVE

## 2023-04-16 MED ORDER — MAGNESIUM SULFATE 2 GM/50ML IV SOLN
2.0000 g | Freq: Once | INTRAVENOUS | Status: AC
  Administered 2023-04-16: 2 g via INTRAVENOUS
  Filled 2023-04-16: qty 50

## 2023-04-16 MED ORDER — SODIUM CHLORIDE 0.9 % IV SOLN
2.0000 g | Freq: Once | INTRAVENOUS | Status: AC
  Administered 2023-04-16: 2 g via INTRAVENOUS
  Filled 2023-04-16: qty 20

## 2023-04-16 MED ORDER — POLYETHYLENE GLYCOL 3350 17 G PO PACK: 17.0000 g | PACK | Freq: Every day | ORAL | Status: DC | PRN

## 2023-04-16 MED ORDER — SODIUM CHLORIDE 0.9% FLUSH
5.0000 mL | Freq: Three times a day (TID) | INTRAVENOUS | Status: DC
  Administered 2023-04-16 – 2023-04-19 (×10): 5 mL

## 2023-04-16 MED ORDER — MIDAZOLAM HCL 2 MG/2ML IJ SOLN
INTRAMUSCULAR | Status: AC | PRN
  Administered 2023-04-16 (×2): .5 mg via INTRAVENOUS
  Administered 2023-04-16 (×2): 1 mg via INTRAVENOUS

## 2023-04-16 MED ORDER — FENTANYL CITRATE (PF) 100 MCG/2ML IJ SOLN
INTRAMUSCULAR | Status: AC | PRN
  Administered 2023-04-16 (×5): 25 ug via INTRAVENOUS

## 2023-04-16 MED ORDER — DOXYCYCLINE HYCLATE 100 MG IV SOLR
100.0000 mg | Freq: Two times a day (BID) | INTRAVENOUS | Status: DC
  Administered 2023-04-16 – 2023-04-19 (×7): 100 mg via INTRAVENOUS
  Filled 2023-04-16 (×10): qty 100

## 2023-04-16 MED ORDER — POTASSIUM CHLORIDE 10 MEQ/100ML IV SOLN
10.0000 meq | INTRAVENOUS | Status: AC
  Administered 2023-04-16 (×2): 10 meq via INTRAVENOUS
  Filled 2023-04-16 (×2): qty 100

## 2023-04-16 MED ORDER — LIDOCAINE HCL 1 % IJ SOLN
10.0000 mL | Freq: Once | INTRAMUSCULAR | Status: AC
  Administered 2023-04-16: 10 mL via INTRADERMAL

## 2023-04-16 MED ORDER — OXYCODONE HCL 5 MG PO TABS
5.0000 mg | ORAL_TABLET | ORAL | Status: DC | PRN
  Administered 2023-04-16 – 2023-04-19 (×6): 5 mg via ORAL
  Filled 2023-04-16 (×6): qty 1

## 2023-04-16 MED ORDER — IOHEXOL 300 MG/ML  SOLN
100.0000 mL | Freq: Once | INTRAMUSCULAR | Status: AC | PRN
  Administered 2023-04-16: 100 mL via INTRAVENOUS

## 2023-04-16 MED ORDER — OXYCODONE HCL 5 MG PO TABS: 2.5000 mg | ORAL_TABLET | ORAL | Status: DC | PRN

## 2023-04-16 MED ORDER — DOXYCYCLINE HYCLATE 100 MG IV SOLR
100.0000 mg | Freq: Once | INTRAVENOUS | Status: AC
  Administered 2023-04-16: 100 mg via INTRAVENOUS
  Filled 2023-04-16 (×2): qty 100

## 2023-04-16 MED ORDER — ONDANSETRON HCL 4 MG/2ML IJ SOLN
4.0000 mg | Freq: Once | INTRAMUSCULAR | Status: AC | PRN
  Administered 2023-04-16: 4 mg via INTRAVENOUS
  Filled 2023-04-16: qty 2

## 2023-04-16 MED ORDER — LACTATED RINGERS IV BOLUS
1000.0000 mL | Freq: Once | INTRAVENOUS | Status: AC
  Administered 2023-04-16: 1000 mL via INTRAVENOUS

## 2023-04-16 MED ORDER — ENOXAPARIN SODIUM 40 MG/0.4ML IJ SOSY: 40.0000 mg | PREFILLED_SYRINGE | INTRAMUSCULAR | Status: DC

## 2023-04-16 MED ORDER — ACETAMINOPHEN 500 MG PO TABS: 1000.0000 mg | ORAL_TABLET | Freq: Four times a day (QID) | ORAL | Status: DC | PRN

## 2023-04-16 MED ORDER — MELATONIN 3 MG PO TABS: 6.0000 mg | ORAL_TABLET | Freq: Every evening | ORAL | Status: DC | PRN

## 2023-04-16 MED ORDER — MONTELUKAST SODIUM 10 MG PO TABS
10.0000 mg | ORAL_TABLET | Freq: Every day | ORAL | Status: DC
  Administered 2023-04-16 – 2023-04-18 (×3): 10 mg via ORAL
  Filled 2023-04-16 (×3): qty 1

## 2023-04-16 MED ORDER — KETOROLAC TROMETHAMINE 30 MG/ML IJ SOLN
30.0000 mg | Freq: Three times a day (TID) | INTRAMUSCULAR | Status: DC
  Administered 2023-04-16 – 2023-04-17 (×5): 30 mg via INTRAVENOUS
  Filled 2023-04-16 (×5): qty 1

## 2023-04-16 MED ORDER — ONDANSETRON HCL 4 MG/2ML IJ SOLN
4.0000 mg | Freq: Four times a day (QID) | INTRAMUSCULAR | Status: DC | PRN
  Administered 2023-04-17 – 2023-04-18 (×2): 4 mg via INTRAVENOUS
  Filled 2023-04-16 (×2): qty 2

## 2023-04-16 MED ORDER — METRONIDAZOLE 500 MG/100ML IV SOLN
500.0000 mg | Freq: Two times a day (BID) | INTRAVENOUS | Status: DC
  Administered 2023-04-16 – 2023-04-19 (×6): 500 mg via INTRAVENOUS
  Filled 2023-04-16 (×6): qty 100

## 2023-04-16 MED ORDER — POTASSIUM CHLORIDE CRYS ER 20 MEQ PO TBCR
40.0000 meq | EXTENDED_RELEASE_TABLET | Freq: Once | ORAL | Status: AC
  Administered 2023-04-16: 40 meq via ORAL
  Filled 2023-04-16: qty 2

## 2023-04-16 MED ORDER — FENTANYL CITRATE (PF) 100 MCG/2ML IJ SOLN
INTRAMUSCULAR | Status: AC
  Filled 2023-04-16: qty 4

## 2023-04-16 MED ORDER — SODIUM CHLORIDE 0.9% FLUSH
3.0000 mL | Freq: Two times a day (BID) | INTRAVENOUS | Status: DC
  Administered 2023-04-17 – 2023-04-19 (×5): 3 mL via INTRAVENOUS

## 2023-04-16 MED ORDER — MORPHINE SULFATE (PF) 4 MG/ML IV SOLN
4.0000 mg | Freq: Once | INTRAVENOUS | Status: AC
  Administered 2023-04-16: 4 mg via INTRAVENOUS
  Filled 2023-04-16: qty 1

## 2023-04-16 MED ORDER — METRONIDAZOLE 500 MG/100ML IV SOLN
500.0000 mg | Freq: Once | INTRAVENOUS | Status: AC
  Administered 2023-04-16: 500 mg via INTRAVENOUS
  Filled 2023-04-16: qty 100

## 2023-04-16 MED ORDER — HYDROMORPHONE HCL 1 MG/ML IJ SOLN
0.5000 mg | INTRAMUSCULAR | Status: DC | PRN
  Administered 2023-04-16 – 2023-04-17 (×2): 0.5 mg via INTRAVENOUS
  Filled 2023-04-16: qty 1
  Filled 2023-04-16: qty 0.5

## 2023-04-16 MED ORDER — MIDAZOLAM HCL 2 MG/2ML IJ SOLN
INTRAMUSCULAR | Status: AC
Start: 2023-04-16 — End: ?
  Filled 2023-04-16: qty 4

## 2023-04-16 MED ORDER — ENOXAPARIN SODIUM 40 MG/0.4ML IJ SOSY
40.0000 mg | PREFILLED_SYRINGE | INTRAMUSCULAR | Status: DC
  Administered 2023-04-16 – 2023-04-18 (×3): 40 mg via SUBCUTANEOUS
  Filled 2023-04-16 (×2): qty 0.4

## 2023-04-16 MED ORDER — SODIUM CHLORIDE 0.9 % IV SOLN
1.0000 g | INTRAVENOUS | Status: DC
  Administered 2023-04-17 – 2023-04-19 (×3): 1 g via INTRAVENOUS
  Filled 2023-04-16 (×2): qty 10

## 2023-04-16 NOTE — Sedation Documentation (Signed)
[facility] called to arrange transport.

## 2023-04-16 NOTE — Progress Notes (Signed)
   04/16/23 1407  TOC Brief Assessment  Insurance and Status Reviewed  Patient has primary care physician Yes  Home environment has been reviewed Single Family Home  Prior level of function: Independent  Prior/Current Home Services No current home services  Social Drivers of Health Review SDOH reviewed interventions complete (Added Financial resources in AVS)  Readmission risk has been reviewed Yes  Transition of care needs no transition of care needs at this time   Transition of Care Department [facility]) has reviewed patient and no TOC needs have been identified at this time. We will continue to monitor patient advancement through interdisciplinary progression rounds. If new patient transition needs arise, please place a TOC consult.  Added resources to AVS for financial barriers.

## 2023-04-16 NOTE — Discharge Instructions (Addendum)
 Rent/Utilities/Housing  Agency: Teacher, Adult Education Address: [address] [address] [place], [place] [address]  [address] [place], [place] [address]  Phone Number: [phone] or [phone] For the hearing-impaired - Dial 711 for [employer] [place] Services  Agency Name: Raynaldo Haws. Dept. of Health and Human Services Address: [address], [place], [place] [address] Phone: [phone] Website: [email or web address]  Services Offered: Temporary financial assistance, subsidized housing, and utility  assistance   Agency Name: [employer]  Address: [address], [place], [place] [address] Phone: [phone] ext. [phone] Email: Contact: info@newrha .org Website: footballpromos.co.[email or web address] Services Offered: Subsidized apartment rent based on income.   Agency Name: [facility] Address: [facility], [address] [place], [place] Phone: [phone]  Website: www.ccmeden.org Services Offered: Museum/gallery curator, utility assistance Technical Brewer for all of  [facility], [employer], [employer], [employer]  March 15, 2020 13 and [place] for [place] area only), rent assistance.   Agency Name: [facility] Address: [address] [place], [place], [place] [address] / [address],  [place] Phone: [phone] [place] / [phone] [place] Website: [email or web address] Services Offered: Civil Service Fast Streamer, food, showers, hygiene products utility payment  assistance, thrift shops, rental assistance, Support Groups  Food Agency Name: [facility] Dis[facility] Transit [facility] Address: [address], [place], [place] [address] Phone: [phone] Website: www.adtsrc.org Services Offered: Meals on [employer] and Meals with Friends.   Home care, at home assisted living, volunteer services, Center  for Active Retirement, transportation  Agency Name:  [facility] Address: Sites vary. Must call first. Food Pantry location: [address] [place], [place] [address] [employer] Phone: [phone] Website: none Services Offered: Museum/gallery curator, utility assistance if funds available Careers Adviser for all of [place], [employer], [employer], Office Manager and [employer] for [place] area only) Walk-in  current Id and current address verification required.  Wed-Thurs: 9:30-12:00 Agency Name: [facility] Address: [address] [place], [place] [address]  Phone: [phone] or [phone] Website: none Services Offered: Food assistance Agency Name: Ndea Kilroy Address: [address] [place], [place] [address]  Phone: [phone] or [phone]  Website: none Services Offered: Serves 1 hot meal a day at 11:00 am Monday-Sunday and 5 pm  on the second and fourth Sunday of each month Agency Name: [facility] Department of [facility]  Services/Social Services  Address: [phone], [place], [place] [address] Phone: [phone] Website: [email or web address]   [email or web address] Services Offered: Sales Executive, [facility] program Agency Name: [facility] Address: [address] [place], [place] [address] Phone: [phone] Website: [email or web address] Services Offered: Food pantry Tuesday, Wednesday and Thursday 9am-11:30am  (need appointment) and health clinic (9:00am-11:00am) Agency Name: [facility] Address: [address], [place] / [address], [place] Phone: [phone] [place] / [phone] [place] Website: opiniontrades.tn [email or web address].co.[email or web address] Services Offered: Civil Service Fast Streamer, food pantry, soup kitchen ([place]) emergency financial  assistance, thrift stores, showers & hygiene products [place]),  Christmas Assistance, spiritual help    Interventional Radiology Percutaneous Abscess Drain  Placement After Care   This sheet gives you information about how to care for yourself after your procedure. Your health care provider may also give you more specific instructions. Your drain was placed by an interventional radiologist with [facility]. If you have questions or concerns, contact [facility] at [phone].   What is a percutaneous drain?   A drain is a small  plastic tube (catheter) that goes into the fluid collection in your body through your skin.   How long will I need the drain?   How long the drain needs to stay in is determined by where the drain is, how much comes out of the drain each day and if you are having any other surgical procedures.   Interventional radiology will determine when it is time to remove the drain. It is important to follow up as directed so that the drain can be removed as soon as it is safe to do so.   What can I expect after the procedure?   After the procedure, it is common to have:   A small amount of bruising and discomfort in the area where the drainage tube (catheter) was placed.   Sleepiness and fatigue. This should go away after the medicines you were given have worn off.   Follow these instructions at home:   Insertion site care   Check your insertion site when you change the bandage. Check for:   More redness, swelling, or pain.   More fluid or blood.   Warmth.   Pus or a bad smell.   When caring for your insertion site:   Wash your hands with soap and water for at least 20 seconds before and after you change your bandage (dressing). If soap and water are not available, use hand sanitizer.   You do not need to change your dressing everyday if it is clean and dry. Change your dressing every 3 days or as needed when it is soiled, wet or becoming dislodged. You will need to change your dressing each time you shower.   Leave stitches (sutures), skin glue, or adhesive strips in place. These skin closures  may need to stay in place for 2 weeks or longer. If adhesive strip edges start to loosen and curl up, you may trim the loose edges. Do not remove adhesive strips completely unless your health care provider tells you to do so.   Catheter care   Flush the catheter once per day with 5 mL of 0.9% normal saline unless you are told otherwise by your healthcare provider. This helps to prevent clogs in the catheter.   To disconnect the drain, turn the clear plastic tube to the left. Attach the saline syringe by placing it on the white end of the drain and turning gently to the right. Once attached gently push the plunger to the 5 mL mark. After you are done flushing, disconnect the syringe by turning to the left and reattach your drainage container   If you have a bulb please be sure the bulb is charged after reconnecting it - to do this pinch the bulb between your thumb and first finger and close the stopper located on the top of the bulb.    Check for fluid leaking from around your catheter (instead of fluid draining through your catheter). This may be a sign that the drain is no longer working correctly.   Write down the following information every time you empty your bag:   The date and time.   The amount of drainage.   Activity   Rest at home for 1-2 days after your procedure.   For the first 48 hours do not lift anything more than 10 lbs (about a gallon of milk). You may perform moderate activities/exercise. Please avoid strenuous activities during this time.   Avoid any activities which may pull on your drain as this  can cause your drain to become dislodged.   If you were given a sedative during the procedure, it can affect you for several hours. Do not drive or operate machinery until your health care provider says that it is safe.   General instructions   For mild pain take over-the-counter medications as needed for pain such as Tylenol  or Advil . If you are experiencing severe pain  please call our office as this may indicate an issue with your drain.    If you were prescribed an antibiotic medicine, take it as told by your health care provider. Do not stop using the antibiotic even if you start to feel better.   You may shower 24 hours after the drain is placed. To do this cover the insertion site with a water tight material such as saran wrap and seal the edges with tape, you may also purchase waterproof dressings at your local drug store. Shower as usual and then remove the water tight dressing and any gauze/tape underneath it once you have exited the shower and dried off. Allow the area to air dry or pat dry with a clean towel. Once the skin is completely dry place a new gauze dressing. It is important to keep the site dry at all times to prevent infection.   Do not submerge the drain - this means you cannot take baths, swim, use a hot tub, etc. until the drain is removed.    Do not use any products that contain nicotine or tobacco, such as cigarettes, e-cigarettes, and chewing tobacco. If you need help quitting, ask your health care provider.   Keep all follow-up visits as told by your health care provider. This is important.   Contact a health care provider if:   You have less than 10 mL of drainage a day for 2-3 days in a row, or as directed by your health care provider.   You have any of these signs of infection:   More redness, swelling, or pain around your incision area.   More fluid or blood coming from your incision area.   Warmth coming from your incision area.   Pus or a bad smell coming from your incision area.   You have fluid leaking from around your catheter (instead of through your catheter).   You are unable to flush the drain.   You have a fever or chills.   You have pain that does not get better with medicine.   You have not been contacted to schedule a drain follow up appointment within 10 days of discharge from the hospital.   Please  call [facility] Radiology at [phone] with any questions or concerns.   Get help right away if:   Your catheter comes out.   You suddenly stop having drainage from your catheter.   You suddenly have blood in the fluid that is draining from your catheter.   You become dizzy or you faint.   You develop a rash.   You have nausea or vomiting.   You have difficulty breathing or you feel short of breath.   You develop chest pain.   You have problems with your speech or vision.   You have trouble balancing or moving your arms or legs.   Summary   It is common to have a small amount of bruising and discomfort in the area where the drainage tube (catheter) was placed. You may also have minor discomfort with movement while the drain is in place.  Flush the drain once per day with 5 mL of 0.9% normal saline (unless you were told otherwise by your healthcare provider).    Record the amount of drainage from the bag every time you empty it.   Change the dressing every 3 days or earlier if soiled/wet. Keep the skin dry under the dressing.   You may shower with the drain in place. Do not submerge the drain (no baths, swimming, hot tubs, etc.).   Contact [facility] at [phone] if you have more redness, swelling, or pain around your incision area or if you have pain that does not get better with medicine.   This information is not intended to replace advice given to you by your health care provider. Make sure you discuss any questions you have with your health care provider.   Document Revised: 06/29/2021 Document Reviewed: 03/21/2019   Elsevier Patient Education  2023 Elsevier Inc.         Interventional Radiology Drain Record   Empty your drain at least once per day. You may empty it as often as needed. Use this form to write down the amount of fluid that has collected in the drainage container. Bring this form with you to your follow-up visits. Please call  [facility] at [phone] with any questions or concerns prior to your appointment.   Drain #1 location: ___________________   Date __________ Time __________ Amount __________   Date __________ Time __________ Amount __________   Date __________ Time __________ Amount __________   Date __________ Time __________ Amount __________   Date __________ Time __________ Amount __________   Date __________ Time __________ Amount __________   Date __________ Time __________ Amount __________   Date __________ Time __________ Amount __________   Date __________ Time __________ Amount __________   Date __________ Time __________ Amount __________   Date __________ Time __________ Amount __________   Date __________ Time __________ Amount __________   Date __________ Time __________ Amount __________   Date __________ Time __________ Amount __________

## 2023-04-16 NOTE — ED Notes (Signed)
 Attempted to get pt urine , pt stated that she does not need to pee at this time.

## 2023-04-16 NOTE — H&P (Signed)
 History and Physical    [name] FMW:[id number] DOB: Aug 22, 1985 DOA: 04/15/2023  PCP: Zarwolo, Gloria, FNP   Patient coming from: Home   Chief Complaint:  Chief Complaint  Patient presents with   Abdominal Pain    HPI:  [name] is a 39 y.o. female with hx of prediabetes, allergies/chronic bronchitis, recent ED visit on 1/5 with worsening abdominal pain and found to have left adnexal mass, possible right hydrosalpinx, treated empirically for PID and discharged from the ED.  Returns due to worsening abdominal pain despite treatment.  Reports that initially her symptoms that began on Saturday 1/4 with more dull lower abdominal pain.  On Sunday becoming sharper and prompting her ED visit.  Since being home severely uncontrolled, worsening with any movement.  She has slight dysuria.  No vaginal bleeding or discharge, no other GI symptoms.   Review of Systems:  ROS complete and negative except as marked above   Allergies  Allergen Reactions   Bee Venom Hives, Shortness Of Breath, Swelling and Rash   Peanut (Diagnostic) Hives, Itching and Rash   Mangifera Indica Fruit Ext (Mango) [Mangifera Indica] Hives, Itching and Rash   Penicillins Rash    Prior to Admission medications   Medication Sig Start Date End Date Taking? Authorizing Provider  baclofen  (LIORESAL ) 10 MG tablet Take 10 mg by mouth every 8 (eight) hours as needed for muscle spasms.    [provider]  doxycycline  (VIBRAMYCIN ) 100 MG capsule Take 1 capsule (100 mg total) by mouth 2 (two) times daily. 04/14/23   Patsey Lot, MD  fluticasone  (FLONASE ) 50 MCG/ACT nasal spray Place 2 sprays into both nostrils daily. Patient taking differently: Place 2 sprays into both nostrils every other day. 11/03/22   Vivienne Delon HERO, PA-C  levocetirizine (XYZAL ) 5 MG tablet Take 1 tablet (5 mg total) by mouth every evening. 03/06/23   Zarwolo, Gloria, FNP  metroNIDAZOLE  (FLAGYL ) 500 MG tablet Take 1 tablet (500  mg total) by mouth 2 (two) times daily. 04/14/23   Patsey Lot, MD  montelukast  (SINGULAIR ) 10 MG tablet Take 1 tablet (10 mg total) by mouth at bedtime. 03/06/23   Zarwolo, Gloria, FNP  naproxen  (NAPROSYN ) 500 MG tablet Take 1 tablet (500 mg total) by mouth 2 (two) times daily with a meal. Patient taking differently: Take 500 mg by mouth 2 (two) times daily as needed for moderate pain (pain score 4-6). 02/11/23   Zarwolo, Gloria, FNP  ondansetron  (ZOFRAN -ODT) 4 MG disintegrating tablet Take 1 tablet (4 mg total) by mouth every 8 (eight) hours as needed for nausea or vomiting. 04/14/23   Patsey Lot, MD  oxyCODONE -acetaminophen  (PERCOCET/ROXICET) 5-325 MG tablet Take 1 tablet by mouth every 6 (six) hours as needed for severe pain (pain score 7-10). 04/14/23   Patsey Lot, MD  Vitamin D , Ergocalciferol , (DRISDOL ) 1.25 MG (50000 UNIT) CAPS capsule TAKE 1 CAPSULE BY MOUTH EVERY 7 DAYS Patient taking differently: Take 50,000 Units by mouth every Friday. 11/14/22   Zarwolo, Gloria, FNP    Past Medical History:  Diagnosis Date   No pertinent past medical history    Sciatica     Past Surgical History:  Procedure Laterality Date   CESAREAN SECTION N/A 05/19/2012   Procedure: Primary Cesarean Section;  Surgeon: Krystal Deaner, MD;  Location: [facility];  Service: Obstetrics;  Laterality: N/A;  Primary Cesarean Section   FOOT SURGERY  as child   arch corrections     reports that she has never smoked. She  has never used smokeless tobacco. She reports that she does not currently use drugs after having used the following drugs: Marijuana. She reports that she does not drink alcohol.  Family History  Problem Relation Age of Onset   Diabetes Mellitus II Mother    Diabetes Father    Diabetes Sister    Diabetes Maternal Grandmother    Diabetes Maternal Grandfather    Diabetes Paternal Grandmother      Physical Exam: Vitals:   04/16/23 0115 04/16/23 0130 04/16/23 0445 04/16/23 0520  BP:  121/75 128/77 125/78   Pulse: (!) 109 (!) 105 (!) 104   Resp:   18   Temp:    98.9 F (37.2 C)  TempSrc:    Oral  SpO2: 99% 95% 98%   Weight:      Height:        Gen: Awake, alert, NAD, well-appearing CV: Regular, normal S1, S2, no murmurs  Resp: Normal WOB, CTAB  Abd: Round, nondistended, hypoactive, moderate to severe tenderness in the lower abdomen, positive mild rebound tenderness.  No guarding or rigidity. MSK: Symmetric, no edema  Skin: No rashes or lesions to exposed skin  Neuro: Alert and interactive  Psych: euthymic, appropriate    Data review:   Labs reviewed, notable for:   WBC 15 Urine hCG negative UA contaminated with squamous cells Prior ED visit with GC/CT negative Wet prep positive clue cell  Micro:  Results for orders placed or performed during the hospital encounter of 04/15/23  Culture, blood (Routine X 2) w Reflex to ID Panel     Status: None (Preliminary result)   Collection Time: 04/16/23  3:45 AM   Specimen: BLOOD  Result Value Ref Range Status   Specimen Description BLOOD LEFT ANTECUBITAL  Final   Special Requests   Final    BOTTLES DRAWN AEROBIC AND ANAEROBIC Blood Culture adequate volume   Culture   Final    NO GROWTH <12 HOURS Performed at [facility], [address] [place], [place] [address]    Report Status PENDING  Incomplete  Culture, blood (Routine X 2) w Reflex to ID Panel     Status: None (Preliminary result)   Collection Time: 04/16/23  3:50 AM   Specimen: BLOOD  Result Value Ref Range Status   Specimen Description BLOOD RIGHT ANTECUBITAL  Final   Special Requests   Final    BOTTLES DRAWN AEROBIC AND ANAEROBIC Blood Culture adequate volume   Culture   Final    NO GROWTH <12 HOURS Performed at [facility], [address] [place], [place] [address]    Report Status PENDING  Incomplete    Imaging reviewed:  CT ABDOMEN PELVIS W CONTRAST Result Date: 04/16/2023 CLINICAL DATA:  Abdominal pain EXAM: CT ABDOMEN AND PELVIS WITH  CONTRAST TECHNIQUE: Multidetector CT imaging of the abdomen and pelvis was performed using the standard protocol following bolus administration of intravenous contrast. RADIATION DOSE REDUCTION: This exam was performed according to the departmental dose-optimization program which includes automated exposure control, adjustment of the mA and/or kV according to patient size and/or use of iterative reconstruction technique. CONTRAST:  OMNIPAQUE  IOHEXOL  300 MG/ML  SOLN COMPARISON:  04/14/2023 no acute abnormality. FINDINGS: Lower chest: No acute abnormality Hepatobiliary: No focal hepatic abnormality. Gallbladder unremarkable. Pancreas: No focal abnormality or ductal dilatation. Spleen: No focal abnormality.  Normal size. Adrenals/Urinary Tract: No adrenal abnormality. No focal renal abnormality. No stones or hydronephrosis. Urinary bladder is unremarkable. Stomach/Bowel: There is mild wall thickening within  the sigmoid colon adjacent to the complex free fluid described below. Favor this is secondarily inflamed. No bowel obstruction. Stomach and small bowel decompressed, unremarkable. Vascular/Lymphatic: No evidence of aneurysm or adenopathy. Reproductive: Uterus unremarkable. Complex process again seen in the left ovary. Multiple cystic areas within the left ovary and left ovary appears enlarged measuring up to 6.2 cm. Findings are similar to prior study. Other: Increasing complex free fluid in the pelvis in the cul-de-sac posterior to the uterus and along the right side of the uterus. There appears to be enhancing rim within this fluid collection. Fluid collection overall measures 10.3 x 4.3 cm. Musculoskeletal: No acute bony abnormality. IMPRESSION: Increasing complex free fluid collection in the pelvis adjacent to the right side and posterior aspect of the uterus. There appears to be an enhancing rim. This could reflect complex ascites, but focal fluid collection such as tubo-ovarian abscess cannot be  excluded. Adjacent sigmoid colon appears thick walled, likely secondarily inflamed. Complex process again noted in the left ovary which is unchanged since prior study. Electronically Signed   By: Franky Crease M.D.   On: 04/16/2023 02:09   US  PELVIC COMPLETE W TRANSVAGINAL AND TORSION R/O Result Date: 04/14/2023 CLINICAL DATA:  Abdominal pain. The patient's last menstrual period was 03/23/2024. EXAM: TRANSABDOMINAL AND TRANSVAGINAL ULTRASOUND OF PELVIS DOPPLER ULTRASOUND OF OVARIES TECHNIQUE: Both transabdominal and transvaginal ultrasound examinations of the pelvis were performed. Transabdominal technique was performed for global imaging of the pelvis including uterus, ovaries, adnexal regions, and pelvic cul-de-sac. It was necessary to proceed with endovaginal exam following the transabdominal exam to visualize the adnexa. Color and duplex Doppler ultrasound was utilized to evaluate blood flow to the ovaries. COMPARISON:  Same day CT abdomen and pelvis. FINDINGS: Uterus Measurements: 9.6 x 4.8 x 6.6 cm = volume: 160 mL. No fibroids or other mass visualized. Endometrium Thickness: 10 mm.  No focal abnormality visualized. Right ovary Measurements: 3.7 x 2.8 x 3.5 cm = volume: 19 mL. An ovarian cyst measures 2.0 x 1.9 x 2.1 cm. No imaging follow-up is recommended for this finding. Left ovary Measurements: 5.5 x 3.5 x 4.6 cm = volume: 46 mL. A cyst in the left ovary with a diffusely thick wall, crenulated appearance, and peripheral blood flow measures 3.7 x 2.2 x 2.8 cm. Pulsed Doppler evaluation of both ovaries demonstrates normal low-resistance arterial and venous waveforms. Other findings Small free fluid. IMPRESSION: Cyst in the left ovary likely represents a corpus luteum, however follow-up pelvic ultrasound is recommended in 6-12 weeks to document resolution. Small volume free fluid in the pelvis. Electronically Signed   By: Norman Hopper M.D.   On: 04/14/2023 10:42   CT ABDOMEN PELVIS W CONTRAST Result  Date: 04/14/2023 CLINICAL DATA:  Abdominal pain, acute, nonlocalized. LMP 2 weeks ago. EXAM: CT ABDOMEN AND PELVIS WITH CONTRAST TECHNIQUE: Multidetector CT imaging of the abdomen and pelvis was performed using the standard protocol following bolus administration of intravenous contrast. RADIATION DOSE REDUCTION: This exam was performed according to the departmental dose-optimization program which includes automated exposure control, adjustment of the mA and/or kV according to patient size and/or use of iterative reconstruction technique. CONTRAST:  OMNIPAQUE  IOHEXOL  300 MG/ML  SOLN COMPARISON:  None Available. FINDINGS: Lower chest: Clear lung bases. No significant pleural or pericardial effusion. Hepatobiliary: The liver is normal in density without suspicious focal abnormality. No evidence of gallstones, gallbladder wall thickening or biliary dilatation. Pancreas: Unremarkable. No pancreatic ductal dilatation or surrounding inflammatory changes. Spleen: Normal in size without  focal abnormality. Small splenules noted. Adrenals/Urinary Tract: Both adrenal glands appear normal. No evidence of urinary tract calculus, suspicious renal lesion or hydronephrosis. The bladder appears unremarkable for its degree of distention. Stomach/Bowel: No enteric contrast administered. The stomach appears unremarkable for its degree of distension. No evidence of bowel wall thickening, distention or surrounding inflammatory change. The appendix appears normal. Vascular/Lymphatic: There are no enlarged abdominal or pelvic lymph nodes. No significant vascular findings. Reproductive: Heterogeneous left adnexal mass measures 6.6 x 4.3 x 4.2 cm. There is surrounding soft tissue stranding. Possible hydrosalpinx on the right. The uterus appears unremarkable. There is a moderate amount free pelvic fluid. Other: Broad-based umbilical hernia containing only fat. No herniated bowel. No generalized abdominal ascites or pneumoperitoneum.  Musculoskeletal: No acute or significant osseous findings. Lumbar facet arthropathy noted. IMPRESSION: 1. Heterogeneous left adnexal mass with surrounding soft tissue stranding and moderate amount of free pelvic fluid. Findings are suspicious for a ruptured ovarian cyst or pelvic inflammatory disease. Differential includes ovarian torsion and ectopic pregnancy. Recommend pelvic ultrasound for further evaluation (already scheduled). 2. Possible hydrosalpinx on the right. 3. No evidence of bowel obstruction or perforation. The appendix appears normal. 4. Broad-based umbilical hernia containing only fat. Electronically Signed   By: Elsie Perone M.D.   On: 04/14/2023 09:20    ED Course:  Additional imaging was obtained per above.  Gynecology consulted who recommended for antibiotic therapy for tubo-ovarian abscess, feel patient okay to stay at [facility] for now and gynecology can see her here.  Possible need for IR intervention.   Assessment/Plan:  38 y.o. female with hx prediabetes, allergies/chronic bronchitis, recent ED visit on 1/5 with worsening abdominal pain and found to have left adnexal mass, possible right hydrosalpinx, treated empirically for PID and discharged from the ED. returned 1/6 evening with worsening abdominal pain, sepsis.  Found to have likely tubo-ovarian abscess.  Pelvic inflammatory disease, likely tubo-ovarian abscess Sepsis secondary to above, present on admission, improving Acute onset of lower abdominal pain progressive since 1/4.  GC/CT negative, wet prep positive clue cell, urine hcg neg previous CT with left adnexal mass up to 6.6 cm in max dimension with pelvic ultrasound 3.7 cm thick-walled cyst in the left ovary favored to be corpus luteum.  Possible right hydrosalpinx. On initial ED presentation 1/6 febrile to 39.1C, tachycardic, WBC 15, lactate 1. Exam with + mild rebound tenderness. Repeat CT abdomen pelvis today demonstrating rim-enhancing complex pelvic fluid  collection posterior to and on the right side of the uterus, adjacent sigmoid wall thickening likely reactive, Left ovary 6.2 cm with multiple cysts without significant changes.    - EDP consulted gynecology, recommending for empiric treatment of TOA. feel patient okay to stay at [facility] for now and gynecology can see her here.  Please contact gynecology in the morning.  Possible need for IR intervention. -Started on ceftriaxone  1 g IV every 24 hour, doxycycline  100 mg IV every 12 hours, Flagyl  500 mg IV every 8 hour -Follow-up blood cultures -Status post 1 L IV fluid, lactate 1.  Continue oral hydration - Symptomatic management: Toradol  30 mg IV every 8 hours scheduled x 6 doses, Tylenol  1 g prn, oxycodone  2.5/5 mg for moderate/severe, Dilaudid  0.5 mg IV every 4 hours for breakthrough  Hypokalemia - Repleted  Chronic medical problems: Prediabetes: A1c 5.8% in 10/'24 Allergies, chronic bronchitis: Continue home Singulair  Sciatica: She takes baclofen  as needed, confirmed she does not take regularly.  Hold for now  Body mass index is 34.81  kg/m. Obesity class I affecting medical care    DVT prophylaxis:  Lovenox  Code Status:  Full Code Diet:  Diet Orders (From admission, onward)     Start     Ordered   04/16/23 0336  Diet regular Room service appropriate? Yes; Fluid consistency: Thin  Diet effective now       Question Answer Comment  Room service appropriate? Yes   Fluid consistency: Thin      04/16/23 9662           Family Communication:  Yes discussed with mother, husband   Consults:  Gyn   Admission status:   Inpatient, Telemetry bed  Severity of Illness: The appropriate patient status for this patient is INPATIENT. Inpatient status is judged to be reasonable and necessary in order to provide the required intensity of service to ensure the patient's safety. The patient's presenting symptoms, physical exam findings, and initial radiographic and laboratory data in the  context of their chronic comorbidities is felt to place them at high risk for further clinical deterioration. Furthermore, it is not anticipated that the patient will be medically stable for discharge from the hospital within 2 midnights of admission.   * I certify that at the point of admission it is my clinical judgment that the patient will require inpatient hospital care spanning beyond 2 midnights from the point of admission due to high intensity of service, high risk for further deterioration and high frequency of surveillance required.*   Dorn Dawson, MD [facility]  How to contact the [facility] Attending or Consulting provider 7A - 7P or covering provider during after hours 7P -7A, for this patient.  Check the care team in [facility] and look for a) attending/consulting [facility] provider listed and b) the [facility] team listed Log into [email or web address] and use [facility]'s universal password to access. If you do not have the password, please contact the hospital operator. Locate the [facility] provider you are looking for under [facility] and page to a number that you can be directly reached. If you still have difficulty reaching the provider, please page the [facility] (Director on Call) for the Hospitalists listed on amion for assistance.  04/16/2023, 7:03 AM

## 2023-04-16 NOTE — Consult Note (Signed)
   OB/GYN Telephone Consult  [name] is a 38 y.o. [phone] presenting with generalized abdominal pain.   I was called for a consult regarding the care of this patient by [facility]                                                          .    The provider had a clinical question regarding recommendations for management of suspected PID/TOA.   The provider presented the following relevant clinical information and I performed a chart review on the patient and reviewed available documentation:  Patient initially presented 2 days ago with severe periumbilical & bilateral lower abdominal pain. She was febrile to T 101.2 with WBC 6.6. She did not have CMT or adnexal tenderness. UPT negative. Wet prep +clue cells & WBCs. Neg GC/CT, HIV/RPR. CT A/P with possible 6.6 x 4.3 x 4.2cm L adnexal mass with surrounding soft tissue stranding, possible R hydrosalpinx and normal appendix. Pelvic US  w/ 3.7cm thick walled cyst in L ovary favored to be corpus luteum. She was treated empirically for PID and discharged home in good condition.   She returned to [facility] yesterday evening with persistent abdominal pain despite taking oxycodone -acetaminophen . ED provider reports diffuse abdominal tenderness with rebound tenderness. She was febrile to 39.1*C at 6p. She is mildly tachycardia in the low 100s with normal blood pressures. Labs revealed interval development of leukocytosis with WBC 15.1. Hgb overall stable (13.8 from 14.6).  CT A/P shows similar appearance of left ovary, but now with increasing complex free fluid in the posterior CDS with rim enhancement measuring 10.3 x 4.3cm as well as wall thickening of the sigmoid suspected to be secondarily inflamed.    BP 128/77   Pulse (!) 105   Temp 99.8 F (37.7 C) (Oral)   Resp 19   Ht 5' 4 (1.626 m)   Wt 92 kg   LMP 03/24/2023 (Exact Date)   SpO2 95%   BMI 34.81 kg/m   Exam- performed by consulting provider   Recommendations:   -Recommend admission for PID and suspected TOA -Reasonable antibiotic regimens would include ceftriaxone  1g q24h & doxycycline  100mg  q12h & metronizadole 500mg  q12h OR cefoxitin 2g q6h with doxycycline  100mg  q12h -Would continue IV antibiotics until afebrile x 24-48 and clinically improving  -Consider IR consultation in AM for drainage of TOA/pelvic fluid collection  Thank you for this consult. Please call [phone] for the OB/GYN attending on service at [facility] if any gynecologic concerns arise.   I spent approximately 5 minutes directly consulting with the provider and verbally discussing this case. Additionally 20 minutes minutes was spent performing chart review and documentation.   Kieth Carolin, MD Attending Obstetrician & Gynecologist, [facility] for [facility], [facility]

## 2023-04-16 NOTE — Progress Notes (Signed)
 [name] is a 38 y.o. female with hx of prediabetes, allergies/chronic bronchitis, recent ED visit on 1/5 with worsening abdominal pain and found to have left adnexal mass, possible right hydrosalpinx, treated empirically for PID and discharged from the ED.  Returns due to worsening abdominal pain despite treatment.  She was admitted with suspected tubo-ovarian abscess/PID and started on antibiotics with Rocephin , doxycycline , and Flagyl  per gynecology recommendations.  IR consulted for drainage of fluid with plans for drainage today.  Patient seen and evaluated at bedside with no new complaints or concerns noted.  Pain currently well-controlled.  She has been admitted after midnight.  Total care time: 30 minutes.

## 2023-04-16 NOTE — Procedures (Signed)
 Interventional Radiology Procedure Note  Procedure: Image guided drain placement, right transgluteal.  8.25F pigtail drain.  Complications: None  EBL: None Sample: Culture sent  Recommendations: - Routine drain care, with sterile flushes, record output - follow up Cx - routine wound care - ok to advance diet per primary order  Signed,  Ami RAMAN. Alona, DO

## 2023-04-16 NOTE — Consult Note (Signed)
 Chief Complaint: Patient was seen in consultation today for  Chief Complaint  Patient presents with   Abdominal Pain   Referring Physician(s): Dr. Maree  Supervising Physician: Alona Corners  Patient Status: [name] - ED  History of Present Illness: [name] is a 38 y.o. female with no significant past medical history who presented to the [facility] ED 04/14/23 with worsening abdominal pain and found to have a left adnexal mass with possible right hydrosalpinx. She was treated empirically for PID and discharged from the ED.   She returned to the ED due to persistent abdominal pain. Physical exam revealed rebound tenderness and fever with labs showing leukocytosis and hypokalemia. A repeat CT scan showed an increasing fluid collection concerning for a tubo-ovarian abscess.   CT abdomen/pelvis 04/16/23 Reproductive: Uterus unremarkable. Complex process again seen in the left ovary. Multiple cystic areas within the left ovary and left ovary appears enlarged measuring up to 6.2 cm. Findings are similar to prior study. Other: Increasing complex free fluid in the pelvis in the cul-de-sac posterior to the uterus and along the right side of the uterus. There appears to be enhancing rim within this fluid collection. Fluid collection overall measures 10.3 x 4.3 cm. IMPRESSION: 1. Increasing complex free fluid collection in the pelvis adjacent to the right side and posterior aspect of the uterus. There appears to be an enhancing rim. This could reflect complex ascites, but focal fluid collection such as tubo-ovarian abscess cannot be excluded. Adjacent sigmoid colon appears thick walled, likely secondarily inflamed. 2. Complex process again noted in the left ovary which is unchanged since prior study.  Interventional Radiology has been asked to evaluate this patient for an image-guided pelvic fluid collection aspiration with possible drain placement. Imaging reviewed and  procedure approved by Dr. Jennefer.   Past Medical History:  Diagnosis Date   No pertinent past medical history    Sciatica     Past Surgical History:  Procedure Laterality Date   CESAREAN SECTION N/A 05/19/2012   Procedure: Primary Cesarean Section;  Surgeon: Krystal Deaner, MD;  Location: [facility];  Service: Obstetrics;  Laterality: N/A;  Primary Cesarean Section   FOOT SURGERY  as child   arch corrections    Allergies: Bee venom, Peanut (diagnostic), Mangifera indica fruit ext (mango) [mangifera indica], and Penicillins  Medications: Prior to Admission medications   Medication Sig Start Date End Date Taking? Authorizing Provider  doxycycline  (VIBRAMYCIN ) 100 MG capsule Take 1 capsule (100 mg total) by mouth 2 (two) times daily. 04/14/23  Yes Patsey Lot, MD  fluticasone  (FLONASE ) 50 MCG/ACT nasal spray Place 2 sprays into both nostrils daily. Patient taking differently: Place 2 sprays into both nostrils every other day. 11/03/22  Yes Vivienne Delon HERO, PA-C  levocetirizine (XYZAL ) 5 MG tablet Take 1 tablet (5 mg total) by mouth every evening. 03/06/23  Yes Zarwolo, Gloria, FNP  metroNIDAZOLE  (FLAGYL ) 500 MG tablet Take 1 tablet (500 mg total) by mouth 2 (two) times daily. 04/14/23  Yes Patsey Lot, MD  montelukast  (SINGULAIR ) 10 MG tablet Take 1 tablet (10 mg total) by mouth at bedtime. 03/06/23  Yes Zarwolo, Gloria, FNP  ondansetron  (ZOFRAN -ODT) 4 MG disintegrating tablet Take 1 tablet (4 mg total) by mouth every 8 (eight) hours as needed for nausea or vomiting. 04/14/23  Yes Patsey Lot, MD  oxyCODONE -acetaminophen  (PERCOCET/ROXICET) 5-325 MG tablet Take 1 tablet by mouth every 6 (six) hours as needed for severe pain (pain score 7-10). 04/14/23  Yes Patsey Lot,  MD  Vitamin D , Ergocalciferol , (DRISDOL ) 1.25 MG (50000 UNIT) CAPS capsule TAKE 1 CAPSULE BY MOUTH EVERY 7 DAYS Patient taking differently: Take 50,000 Units by mouth every Friday. 11/14/22  Yes Zarwolo, Gloria,  FNP  baclofen  (LIORESAL ) 10 MG tablet Take 10 mg by mouth every 8 (eight) hours as needed for muscle spasms.    [provider]  naproxen  (NAPROSYN ) 500 MG tablet Take 1 tablet (500 mg total) by mouth 2 (two) times daily with a meal. Patient taking differently: Take 500 mg by mouth 2 (two) times daily as needed for moderate pain (pain score 4-6). 02/11/23   Zarwolo, Gloria, FNP     Family History  Problem Relation Age of Onset   Diabetes Mellitus II Mother    Diabetes Father    Diabetes Sister    Diabetes Maternal Grandmother    Diabetes Maternal Grandfather    Diabetes Paternal Grandmother     Social History   Socioeconomic History   Marital status: Significant Other    Spouse name: Not on file   Number of children: Not on file   Years of education: Not on file   Highest education level: 12th grade  Occupational History   Not on file  Tobacco Use   Smoking status: Never   Smokeless tobacco: Never  Vaping Use   Vaping status: Never Used  Substance and Sexual Activity   Alcohol use: No   Drug use: Not Currently    Types: Marijuana   Sexual activity: Yes  Other Topics Concern   Not on file  Social History Narrative   Not on file   Social Drivers of Health   Financial Resource Strain: High Risk (03/06/2023)   Overall Financial Resource Strain (CARDIA)    Difficulty of Paying Living Expenses: Hard  Food Insecurity: Food Insecurity Present (03/06/2023)   Hunger Vital Sign    Worried About Running Out of Food in the Last Year: Sometimes true    Ran Out of Food in the Last Year: Never true  Transportation Needs: No Transportation Needs (03/06/2023)   PRAPARE - Administrator, Civil Service (Medical): No    Lack of Transportation (Non-Medical): No  Physical Activity: Inactive (03/06/2023)   Exercise Vital Sign    Days of Exercise per Week: 0 days    Minutes of Exercise per Session: 10 min  Stress: No Stress Concern Present (03/06/2023)   [employer] - Occupational Stress Questionnaire    Feeling of Stress : Only a little  Social Connections: Moderately Isolated (03/06/2023)   Social Connection and Isolation Panel [NHANES]    Frequency of Communication with Friends and Family: More than three times a week    Frequency of Social Gatherings with Friends and Family: Once a week    Attends Religious Services: Never    Database Administrator or Organizations: No    Attends Engineer, Structural: Not on file    Marital Status: Living with partner    Review of Systems: A 12 point ROS discussed and pertinent positives are indicated in the HPI above.  All other systems are negative.  Review of Systems  Constitutional:  Negative for appetite change and fatigue.  Respiratory:  Negative for cough and shortness of breath.   Cardiovascular:  Negative for chest pain and leg swelling.  Gastrointestinal:  Positive for abdominal pain. Negative for diarrhea, nausea and vomiting.  Musculoskeletal:  Negative for back pain.  Neurological:  Negative for dizziness  and headaches.    Vital Signs: BP 114/72 (BP Location: Left Arm)   Pulse 97   Temp 98 F (36.7 C) (Oral)   Resp 18   Ht 5' 4 (1.626 m)   Wt 202 lb 13.2 oz (92 kg)   LMP 03/24/2023 (Exact Date)   SpO2 98%   BMI 34.81 kg/m   Physical Exam Constitutional:      General: She is not in acute distress.    Appearance: She is not ill-appearing.  HENT:     Mouth/Throat:     Mouth: Mucous membranes are moist.     Pharynx: Oropharynx is clear.  Cardiovascular:     Rate and Rhythm: Normal rate and regular rhythm.     Pulses: Normal pulses.     Heart sounds: Normal heart sounds.  Pulmonary:     Effort: Pulmonary effort is normal.     Breath sounds: Normal breath sounds.  Abdominal:     General: Bowel sounds are normal.     Palpations: Abdomen is soft.     Tenderness: There is abdominal tenderness.  Musculoskeletal:     Right lower leg: No  edema.     Left lower leg: No edema.  Skin:    General: Skin is warm and dry.  Neurological:     Mental Status: She is alert. She is disoriented.  Psychiatric:        Mood and Affect: Mood normal.        Behavior: Behavior normal.        Thought Content: Thought content normal.        Judgment: Judgment normal.     Imaging: CT ABDOMEN PELVIS W CONTRAST Result Date: 04/16/2023 CLINICAL DATA:  Abdominal pain EXAM: CT ABDOMEN AND PELVIS WITH CONTRAST TECHNIQUE: Multidetector CT imaging of the abdomen and pelvis was performed using the standard protocol following bolus administration of intravenous contrast. RADIATION DOSE REDUCTION: This exam was performed according to the departmental dose-optimization program which includes automated exposure control, adjustment of the mA and/or kV according to patient size and/or use of iterative reconstruction technique. CONTRAST:  OMNIPAQUE  IOHEXOL  300 MG/ML  SOLN COMPARISON:  04/14/2023 no acute abnormality. FINDINGS: Lower chest: No acute abnormality Hepatobiliary: No focal hepatic abnormality. Gallbladder unremarkable. Pancreas: No focal abnormality or ductal dilatation. Spleen: No focal abnormality.  Normal size. Adrenals/Urinary Tract: No adrenal abnormality. No focal renal abnormality. No stones or hydronephrosis. Urinary bladder is unremarkable. Stomach/Bowel: There is mild wall thickening within the sigmoid colon adjacent to the complex free fluid described below. Favor this is secondarily inflamed. No bowel obstruction. Stomach and small bowel decompressed, unremarkable. Vascular/Lymphatic: No evidence of aneurysm or adenopathy. Reproductive: Uterus unremarkable. Complex process again seen in the left ovary. Multiple cystic areas within the left ovary and left ovary appears enlarged measuring up to 6.2 cm. Findings are similar to prior study. Other: Increasing complex free fluid in the pelvis in the cul-de-sac posterior to the uterus and along the  right side of the uterus. There appears to be enhancing rim within this fluid collection. Fluid collection overall measures 10.3 x 4.3 cm. Musculoskeletal: No acute bony abnormality. IMPRESSION: Increasing complex free fluid collection in the pelvis adjacent to the right side and posterior aspect of the uterus. There appears to be an enhancing rim. This could reflect complex ascites, but focal fluid collection such as tubo-ovarian abscess cannot be excluded. Adjacent sigmoid colon appears thick walled, likely secondarily inflamed. Complex process again noted in the left ovary which is  unchanged since prior study. Electronically Signed   By: Franky Crease M.D.   On: 04/16/2023 02:09   US  PELVIC COMPLETE W TRANSVAGINAL AND TORSION R/O Result Date: 04/14/2023 CLINICAL DATA:  Abdominal pain. The patient's last menstrual period was 03/23/2024. EXAM: TRANSABDOMINAL AND TRANSVAGINAL ULTRASOUND OF PELVIS DOPPLER ULTRASOUND OF OVARIES TECHNIQUE: Both transabdominal and transvaginal ultrasound examinations of the pelvis were performed. Transabdominal technique was performed for global imaging of the pelvis including uterus, ovaries, adnexal regions, and pelvic cul-de-sac. It was necessary to proceed with endovaginal exam following the transabdominal exam to visualize the adnexa. Color and duplex Doppler ultrasound was utilized to evaluate blood flow to the ovaries. COMPARISON:  Same day CT abdomen and pelvis. FINDINGS: Uterus Measurements: 9.6 x 4.8 x 6.6 cm = volume: 160 mL. No fibroids or other mass visualized. Endometrium Thickness: 10 mm.  No focal abnormality visualized. Right ovary Measurements: 3.7 x 2.8 x 3.5 cm = volume: 19 mL. An ovarian cyst measures 2.0 x 1.9 x 2.1 cm. No imaging follow-up is recommended for this finding. Left ovary Measurements: 5.5 x 3.5 x 4.6 cm = volume: 46 mL. A cyst in the left ovary with a diffusely thick wall, crenulated appearance, and peripheral blood flow measures 3.7 x 2.2 x 2.8 cm.  Pulsed Doppler evaluation of both ovaries demonstrates normal low-resistance arterial and venous waveforms. Other findings Small free fluid. IMPRESSION: Cyst in the left ovary likely represents a corpus luteum, however follow-up pelvic ultrasound is recommended in 6-12 weeks to document resolution. Small volume free fluid in the pelvis. Electronically Signed   By: Norman Hopper M.D.   On: 04/14/2023 10:42   CT ABDOMEN PELVIS W CONTRAST Result Date: 04/14/2023 CLINICAL DATA:  Abdominal pain, acute, nonlocalized. LMP 2 weeks ago. EXAM: CT ABDOMEN AND PELVIS WITH CONTRAST TECHNIQUE: Multidetector CT imaging of the abdomen and pelvis was performed using the standard protocol following bolus administration of intravenous contrast. RADIATION DOSE REDUCTION: This exam was performed according to the departmental dose-optimization program which includes automated exposure control, adjustment of the mA and/or kV according to patient size and/or use of iterative reconstruction technique. CONTRAST:  OMNIPAQUE  IOHEXOL  300 MG/ML  SOLN COMPARISON:  None Available. FINDINGS: Lower chest: Clear lung bases. No significant pleural or pericardial effusion. Hepatobiliary: The liver is normal in density without suspicious focal abnormality. No evidence of gallstones, gallbladder wall thickening or biliary dilatation. Pancreas: Unremarkable. No pancreatic ductal dilatation or surrounding inflammatory changes. Spleen: Normal in size without focal abnormality. Small splenules noted. Adrenals/Urinary Tract: Both adrenal glands appear normal. No evidence of urinary tract calculus, suspicious renal lesion or hydronephrosis. The bladder appears unremarkable for its degree of distention. Stomach/Bowel: No enteric contrast administered. The stomach appears unremarkable for its degree of distension. No evidence of bowel wall thickening, distention or surrounding inflammatory change. The appendix appears normal. Vascular/Lymphatic: There  are no enlarged abdominal or pelvic lymph nodes. No significant vascular findings. Reproductive: Heterogeneous left adnexal mass measures 6.6 x 4.3 x 4.2 cm. There is surrounding soft tissue stranding. Possible hydrosalpinx on the right. The uterus appears unremarkable. There is a moderate amount free pelvic fluid. Other: Broad-based umbilical hernia containing only fat. No herniated bowel. No generalized abdominal ascites or pneumoperitoneum. Musculoskeletal: No acute or significant osseous findings. Lumbar facet arthropathy noted. IMPRESSION: 1. Heterogeneous left adnexal mass with surrounding soft tissue stranding and moderate amount of free pelvic fluid. Findings are suspicious for a ruptured ovarian cyst or pelvic inflammatory disease. Differential includes ovarian torsion and ectopic pregnancy.  Recommend pelvic ultrasound for further evaluation (already scheduled). 2. Possible hydrosalpinx on the right. 3. No evidence of bowel obstruction or perforation. The appendix appears normal. 4. Broad-based umbilical hernia containing only fat. Electronically Signed   By: Elsie Perone M.D.   On: 04/14/2023 09:20    Labs:  CBC: Recent Labs    01/23/23 0853 04/14/23 0624 04/15/23 1955 04/16/23 0429  WBC 5.3 6.6 15.1* 14.1*  HGB 14.6 14.6 13.8 13.0  HCT 44.3 42.2 39.6 38.2  PLT 332 327 321 325    COAGS: No results for input(s): INR, APTT in the last 8760 hours.  BMP: Recent Labs    11/12/22 1751 01/23/23 0853 04/14/23 0624 04/15/23 1955 04/16/23 0429  NA 132* 139 135 133* 133*  K 3.7 4.6 3.6 3.0* 2.9*  CL 97* 100 99 98 95*  CO2 27 27 27 26 28   GLUCOSE 85 98 120* 94 76  BUN 7 7 5* <5* 5*  CALCIUM 8.9 9.9 9.5 8.9 8.9  CREATININE 0.82 0.85 0.71 0.79 0.73  GFRNONAA >60  --  >60 >60 >60    LIVER FUNCTION TESTS: Recent Labs    11/12/22 1751 01/23/23 0853 04/14/23 0624 04/15/23 1955  BILITOT 0.3 0.5 0.6 1.2  AST 14* 17 16 12*  ALT 20 15 15 12   ALKPHOS 71 68 54 58  PROT 7.6  6.9 7.7 7.5  ALBUMIN 3.9 4.5 4.0 3.5    TUMOR MARKERS: No results for input(s): AFPTM, CEA, CA199, CHROMGRNA in the last 8760 hours.  Assessment and Plan:  Pelvic fluid collection concerning for tubo-ovarian abscess: [name], 38 year old female, presents today to the [facility] Interventional Radiology department for an image-guided pelvic fluid collection aspiration with possible drain placement. The patient was transported to [facility] via [place] and she will return to [facility] post-procedure.   Risks and benefits discussed with the patient including bleeding, infection, damage to adjacent structures, bowel perforation/fistula connection, and sepsis.  All of the patient's questions were answered, patient is agreeable to proceed. She has been NPO. She has not recently received any blood-thinning medications.  Consent signed and in chart.  Thank you for this interesting consult.  I greatly enjoyed meeting [address] and look forward to participating in their care.  A copy of this report was sent to the requesting provider on this date.  Electronically Signed: Warren Dais, AGACNP-BC 04/16/2023, 10:02 AM   I spent a total of 20 Minutes    in face to face in clinical consultation, greater than 50% of which was counseling/coordinating care for pelvic abscess drain.

## 2023-04-16 NOTE — Progress Notes (Signed)
 No show

## 2023-04-16 NOTE — ED Provider Notes (Signed)
 [facility] EMERGENCY DEPARTMENT AT [facility] Provider Note   CSN: [id number] Arrival date & time: 04/15/23  1641     History  Chief Complaint  Patient presents with   Abdominal Pain    [name] is a 38 y.o. female.  The history is provided by the patient.  Abdominal Pain She was in the emergency department yesterday complaining of right sided abdominal pain and had CT and ultrasound which showed right-sided ovarian cyst, discharged with prescriptions for oxycodone -acetaminophen , doxycycline , metronidazole .  Today, she states her pain is unchanged and is not being adequately controlled by oxycodone -acetaminophen .  She also notes that any vibration makes her belly hurt.  She denies nausea or vomiting.   Home Medications Prior to Admission medications   Medication Sig Start Date End Date Taking? Authorizing Provider  baclofen  (LIORESAL ) 10 MG tablet Take 10 mg by mouth every 8 (eight) hours as needed for muscle spasms.    [provider]  doxycycline  (VIBRAMYCIN ) 100 MG capsule Take 1 capsule (100 mg total) by mouth 2 (two) times daily. 04/14/23   Patsey Lot, MD  fluticasone  (FLONASE ) 50 MCG/ACT nasal spray Place 2 sprays into both nostrils daily. Patient taking differently: Place 2 sprays into both nostrils every other day. 11/03/22   Vivienne Delon HERO, PA-C  levocetirizine (XYZAL ) 5 MG tablet Take 1 tablet (5 mg total) by mouth every evening. 03/06/23   Zarwolo, Gloria, FNP  metroNIDAZOLE  (FLAGYL ) 500 MG tablet Take 1 tablet (500 mg total) by mouth 2 (two) times daily. 04/14/23   Patsey Lot, MD  montelukast  (SINGULAIR ) 10 MG tablet Take 1 tablet (10 mg total) by mouth at bedtime. 03/06/23   Zarwolo, Gloria, FNP  naproxen  (NAPROSYN ) 500 MG tablet Take 1 tablet (500 mg total) by mouth 2 (two) times daily with a meal. Patient taking differently: Take 500 mg by mouth 2 (two) times daily as needed for moderate pain (pain score 4-6). 02/11/23   Zarwolo,  Gloria, FNP  ondansetron  (ZOFRAN -ODT) 4 MG disintegrating tablet Take 1 tablet (4 mg total) by mouth every 8 (eight) hours as needed for nausea or vomiting. 04/14/23   Patsey Lot, MD  oxyCODONE -acetaminophen  (PERCOCET/ROXICET) 5-325 MG tablet Take 1 tablet by mouth every 6 (six) hours as needed for severe pain (pain score 7-10). 04/14/23   Patsey Lot, MD  Vitamin D , Ergocalciferol , (DRISDOL ) 1.25 MG (50000 UNIT) CAPS capsule TAKE 1 CAPSULE BY MOUTH EVERY 7 DAYS Patient taking differently: Take 50,000 Units by mouth every Friday. 11/14/22   Zarwolo, Gloria, FNP      Allergies    Bee venom, Peanut (diagnostic), Mangifera indica fruit ext (mango) [mangifera indica], and Penicillins    Review of Systems   Review of Systems  Gastrointestinal:  Positive for abdominal pain.  All other systems reviewed and are negative.   Physical Exam Updated Vital Signs BP 122/78   Pulse (!) 108   Temp 99.8 F (37.7 C) (Oral)   Resp 19   Ht 5' 4 (1.626 m)   Wt 92 kg   LMP 03/24/2023 (Exact Date)   SpO2 98%   BMI 34.81 kg/m  Physical Exam Vitals and nursing note reviewed.   38 year old female, resting comfortably and in no acute distress. Vital signs are significant for slightly elevated heart rate. Oxygen saturation is 98%, which is normal. Head is normocephalic and atraumatic. PERRLA, EOMI.  Lungs are clear without rales, wheezes, or rhonchi. Chest is nontender. Heart has regular rate and rhythm without murmur.  Abdomen is soft, flat, with moderate tenderness diffusely.  There is no guarding, but there is rebound tenderness diffusely.  Peristalsis is present. Extremities have no cyanosis or edema, full range of motion is present. Skin is warm and dry without rash. Neurologic: Mental status is normal, moves all extremities equally.  ED Results / Procedures / Treatments   Labs (all labs ordered are listed, but only abnormal results are displayed) Labs Reviewed  COMPREHENSIVE METABOLIC  PANEL - Abnormal; Notable for the following components:      Result Value   Sodium 133 (*)    Potassium 3.0 (*)    BUN <5 (*)    AST 12 (*)    All other components within normal limits  CBC - Abnormal; Notable for the following components:   WBC 15.1 (*)    All other components within normal limits  LIPASE, BLOOD  URINALYSIS, ROUTINE W REFLEX MICROSCOPIC  PREGNANCY, URINE    EKG None  Radiology US  PELVIC COMPLETE W TRANSVAGINAL AND TORSION R/O Result Date: 04/14/2023 CLINICAL DATA:  Abdominal pain. The patient's last menstrual period was 03/23/2024. EXAM: TRANSABDOMINAL AND TRANSVAGINAL ULTRASOUND OF PELVIS DOPPLER ULTRASOUND OF OVARIES TECHNIQUE: Both transabdominal and transvaginal ultrasound examinations of the pelvis were performed. Transabdominal technique was performed for global imaging of the pelvis including uterus, ovaries, adnexal regions, and pelvic cul-de-sac. It was necessary to proceed with endovaginal exam following the transabdominal exam to visualize the adnexa. Color and duplex Doppler ultrasound was utilized to evaluate blood flow to the ovaries. COMPARISON:  Same day CT abdomen and pelvis. FINDINGS: Uterus Measurements: 9.6 x 4.8 x 6.6 cm = volume: 160 mL. No fibroids or other mass visualized. Endometrium Thickness: 10 mm.  No focal abnormality visualized. Right ovary Measurements: 3.7 x 2.8 x 3.5 cm = volume: 19 mL. An ovarian cyst measures 2.0 x 1.9 x 2.1 cm. No imaging follow-up is recommended for this finding. Left ovary Measurements: 5.5 x 3.5 x 4.6 cm = volume: 46 mL. A cyst in the left ovary with a diffusely thick wall, crenulated appearance, and peripheral blood flow measures 3.7 x 2.2 x 2.8 cm. Pulsed Doppler evaluation of both ovaries demonstrates normal low-resistance arterial and venous waveforms. Other findings Small free fluid. IMPRESSION: Cyst in the left ovary likely represents a corpus luteum, however follow-up pelvic ultrasound is recommended in 6-12 weeks to  document resolution. Small volume free fluid in the pelvis. Electronically Signed   By: Norman Hopper M.D.   On: 04/14/2023 10:42   CT ABDOMEN PELVIS W CONTRAST Result Date: 04/14/2023 CLINICAL DATA:  Abdominal pain, acute, nonlocalized. LMP 2 weeks ago. EXAM: CT ABDOMEN AND PELVIS WITH CONTRAST TECHNIQUE: Multidetector CT imaging of the abdomen and pelvis was performed using the standard protocol following bolus administration of intravenous contrast. RADIATION DOSE REDUCTION: This exam was performed according to the departmental dose-optimization program which includes automated exposure control, adjustment of the mA and/or kV according to patient size and/or use of iterative reconstruction technique. CONTRAST:  OMNIPAQUE  IOHEXOL  300 MG/ML  SOLN COMPARISON:  None Available. FINDINGS: Lower chest: Clear lung bases. No significant pleural or pericardial effusion. Hepatobiliary: The liver is normal in density without suspicious focal abnormality. No evidence of gallstones, gallbladder wall thickening or biliary dilatation. Pancreas: Unremarkable. No pancreatic ductal dilatation or surrounding inflammatory changes. Spleen: Normal in size without focal abnormality. Small splenules noted. Adrenals/Urinary Tract: Both adrenal glands appear normal. No evidence of urinary tract calculus, suspicious renal lesion or hydronephrosis. The bladder appears unremarkable for  its degree of distention. Stomach/Bowel: No enteric contrast administered. The stomach appears unremarkable for its degree of distension. No evidence of bowel wall thickening, distention or surrounding inflammatory change. The appendix appears normal. Vascular/Lymphatic: There are no enlarged abdominal or pelvic lymph nodes. No significant vascular findings. Reproductive: Heterogeneous left adnexal mass measures 6.6 x 4.3 x 4.2 cm. There is surrounding soft tissue stranding. Possible hydrosalpinx on the right. The uterus appears unremarkable. There is a  moderate amount free pelvic fluid. Other: Broad-based umbilical hernia containing only fat. No herniated bowel. No generalized abdominal ascites or pneumoperitoneum. Musculoskeletal: No acute or significant osseous findings. Lumbar facet arthropathy noted. IMPRESSION: 1. Heterogeneous left adnexal mass with surrounding soft tissue stranding and moderate amount of free pelvic fluid. Findings are suspicious for a ruptured ovarian cyst or pelvic inflammatory disease. Differential includes ovarian torsion and ectopic pregnancy. Recommend pelvic ultrasound for further evaluation (already scheduled). 2. Possible hydrosalpinx on the right. 3. No evidence of bowel obstruction or perforation. The appendix appears normal. 4. Broad-based umbilical hernia containing only fat. Electronically Signed   By: Elsie Perone M.D.   On: 04/14/2023 09:20    Procedures Procedures    Medications Ordered in ED Medications  acetaminophen  (TYLENOL ) tablet 650 mg (650 mg Oral Given 04/15/23 2338)    ED Course/ Medical Decision Making/ A&P                                 Medical Decision Making Amount and/or Complexity of Data Reviewed Labs: ordered. Radiology: ordered.  Risk OTC drugs. Prescription drug management. Decision regarding hospitalization.   Persistent abdominal pain with rebound tenderness.  I reviewed her past records, and ED note for visit 04/14/2023 does describe generalized abdominal tenderness but specifically states no rebound tenderness.  I am concerned that there has been significant progression of her underlying condition.  I reviewed her laboratory results, and my interpretation is mild hyponatremia which is not felt to be clinically significant, hypokalemia which is new and will require oral potassium supplementation, leukocytosis which is new.  Given new onset of leukocytosis and new onset of rebound tenderness, I feel it is very important to repeat her abdominal CT scan.  I have ordered  morphine  for pain.  Urinalysis has come back significant for ketonuria and 21-50 WBCs with rare bacteria.  I am not concerned about UTI as her medication for treatment of PID would adequately treat UTI.  CT scan shows an increasing complex free fluid collection in the pelvis adjacent in the right side and posterior aspect of the uterus with an enhancing rim which could represent complex ascites but tubo-ovarian abscess cannot be excluded, complex process in the left ovary is unchanged.  I have discussed the case with on-call gynecology and there is concern for tubo-ovarian abscess and recommendation is for hospital admission and IV antibiotics and formal gynecology consultation, consideration for possible needle aspiration by interventional radiology.  I have discussed the case with Dr. Keturah of [facility], who agrees to admit the patient.  Also, I have ordered oral potassium because of her hypokalemia.  Final Clinical Impression(s) / ED Diagnoses Final diagnoses:  PID (acute pelvic inflammatory disease)  Hypokalemia  Hyponatremia    Rx / DC Orders ED Discharge Orders     None         Raford Lenis, MD 04/16/23 [phone]

## 2023-04-17 DIAGNOSIS — N7093 Salpingitis and oophoritis, unspecified: Secondary | ICD-10-CM | POA: Diagnosis not present

## 2023-04-17 LAB — CBC
HCT: 32.9 % — ABNORMAL LOW (ref 36.0–46.0)
Hemoglobin: 11.2 g/dL — ABNORMAL LOW (ref 12.0–15.0)
MCH: 31.5 pg (ref 26.0–34.0)
MCHC: 34 g/dL (ref 30.0–36.0)
MCV: 92.4 fL (ref 80.0–100.0)
Platelets: 276 10*3/uL (ref 150–400)
RBC: 3.56 MIL/uL — ABNORMAL LOW (ref 3.87–5.11)
RDW: 12.5 % (ref 11.5–15.5)
WBC: 8.8 10*3/uL (ref 4.0–10.5)
nRBC: 0 % (ref 0.0–0.2)

## 2023-04-17 LAB — BASIC METABOLIC PANEL
Anion gap: 8 (ref 5–15)
BUN: 7 mg/dL (ref 6–20)
CO2: 27 mmol/L (ref 22–32)
Calcium: 8.4 mg/dL — ABNORMAL LOW (ref 8.9–10.3)
Chloride: 101 mmol/L (ref 98–111)
Creatinine, Ser: 0.66 mg/dL (ref 0.44–1.00)
GFR, Estimated: >60 mL/min (ref >60)
Glucose, Bld: 103 mg/dL — ABNORMAL HIGH (ref 70–99)
Potassium: 3.5 mmol/L (ref 3.5–5.1)
Sodium: 136 mmol/L (ref 135–145)

## 2023-04-17 LAB — MAGNESIUM: Magnesium: 1.8 mg/dL (ref 1.7–2.4)

## 2023-04-17 MED ORDER — ACETAMINOPHEN 325 MG PO TABS: 650.0000 mg | ORAL_TABLET | Freq: Four times a day (QID) | ORAL | Status: DC | PRN

## 2023-04-17 MED ORDER — KETOROLAC TROMETHAMINE 30 MG/ML IJ SOLN
15.0000 mg | Freq: Four times a day (QID) | INTRAMUSCULAR | Status: DC
  Administered 2023-04-17 – 2023-04-19 (×6): 15 mg via INTRAVENOUS
  Filled 2023-04-17 (×8): qty 1

## 2023-04-17 MED ORDER — PANTOPRAZOLE SODIUM 40 MG PO TBEC
40.0000 mg | DELAYED_RELEASE_TABLET | Freq: Every day | ORAL | Status: DC
  Administered 2023-04-17 – 2023-04-19 (×3): 40 mg via ORAL
  Filled 2023-04-17 (×3): qty 1

## 2023-04-17 MED ORDER — LORATADINE 10 MG PO TABS
10.0000 mg | ORAL_TABLET | Freq: Every day | ORAL | Status: DC
  Administered 2023-04-17 – 2023-04-19 (×3): 10 mg via ORAL
  Filled 2023-04-17 (×4): qty 1

## 2023-04-17 MED ORDER — KETOROLAC TROMETHAMINE 30 MG/ML IJ SOLN: 30.0000 mg | Freq: Four times a day (QID) | INTRAMUSCULAR | Status: DC

## 2023-04-17 MED ORDER — ACETAMINOPHEN 500 MG PO TABS
1000.0000 mg | ORAL_TABLET | Freq: Three times a day (TID) | ORAL | Status: DC
  Administered 2023-04-17 – 2023-04-19 (×6): 1000 mg via ORAL
  Filled 2023-04-17 (×6): qty 2

## 2023-04-17 NOTE — Progress Notes (Signed)
 PROGRESS NOTE    [name]  FMW:[id number] DOB: 09/20/1985 DOA: 04/15/2023 PCP: Zarwolo, Gloria, FNP  Chief Complaint  Patient presents with   Abdominal Pain    Brief Narrative:   38 y.o. female with hx prediabetes, allergies/chronic bronchitis, recent ED visit on 1/5 with worsening abdominal pain and found to have left adnexal mass, possible right hydrosalpinx, treated empirically for PID and discharged from the ED. returned 1/6 evening with worsening abdominal pain, sepsis.  Found to have likely tubo-ovarian abscess.  Assessment & Plan:   Principal Problem:   TOA (tubo-ovarian abscess)  Pelvic inflammatory disease, likely tubo-ovarian abscess Sepsis secondary to above, present on admission, improving Acute onset of lower abdominal pain progressive since 1/4.   GC/CT negative, wet prep positive clue cell, urine hcg neg p CT abdomen pelvis 1/7 with increasing complex fluid collection in the pelvis adjacent to the right side and posterior aspect of the uterus - concern for complex ascites vs TOA.  Complex process in L ovary.  EDP consulted gynecology, recommending for empiric treatment of TOA. feel patient okay to stay at [facility] for now and gynecology can see her here.   S/p right transgluteal drain placement, awaiting culture results  Discussed with gyn today, they noted Dr. Jayne should be coming to see, call out to Dr. Jayne Scheduled APAP, toradol , prn oxy.  Dilaudid  for breakthrough   Hypokalemia - Repleted   Chronic medical problems: Prediabetes: A1c 5.8% in 10/'24 Allergies, chronic bronchitis: Continue home Singulair  Sciatica: She takes baclofen  as needed, confirmed she does not take regularly.  Hold for now     DVT prophylaxis: lovenox  Code Status: full Family Communication: mom Disposition:   Status is: Inpatient Remains inpatient appropriate because: awaiting further improvement   Consultants:  Gyn IR  Procedures:   Image guided drain placement,  right transgluteal.  8.68F pigtail drain.   Antimicrobials:  Anti-infectives (From admission, onward)    Start     Dose/Rate Route Frequency Ordered Stop   04/17/23 0400  cefTRIAXone  (ROCEPHIN ) 1 g in sodium chloride  0.9 % 100 mL IVPB        1 g 200 mL/hr over 30 Minutes Intravenous Every 24 hours 04/16/23 0339     04/16/23 1800  doxycycline  (VIBRAMYCIN ) 100 mg in dextrose  5 % 250 mL IVPB        100 mg 125 mL/hr over 120 Minutes Intravenous Every 12 hours 04/16/23 0339     04/16/23 1600  metroNIDAZOLE  (FLAGYL ) IVPB 500 mg        500 mg 100 mL/hr over 60 Minutes Intravenous Every 12 hours 04/16/23 0339     04/16/23 0245  cefTRIAXone  (ROCEPHIN ) 2 g in sodium chloride  0.9 % 100 mL IVPB        2 g 200 mL/hr over 30 Minutes Intravenous  Once 04/16/23 0242 04/16/23 0454   04/16/23 0245  doxycycline  (VIBRAMYCIN ) 100 mg in dextrose  5 % 250 mL IVPB        100 mg 125 mL/hr over 120 Minutes Intravenous  Once 04/16/23 0242 04/16/23 0719   04/16/23 0245  metroNIDAZOLE  (FLAGYL ) IVPB 500 mg        500 mg 100 mL/hr over 60 Minutes Intravenous  Once 04/16/23 0242 04/16/23 0550       Subjective: C/o pain at drain site   Objective: Vitals:   04/16/23 1801 04/16/23 2048 04/17/23 0435 04/17/23 1358  BP: 121/71 119/74 (!) 105/58 124/74  Pulse: 98 (!) 104 83 89  Resp:  20 20   Temp:  98.3 F (36.8 C) 98 F (36.7 C)   TempSrc:  Oral Oral   SpO2: 100% 99% 100% 100%  Weight:      Height:        Intake/Output Summary (Last 24 hours) at 04/17/2023 1656 Last data filed at 04/17/2023 1423 Gross per 24 hour  Intake 740 ml  Output 30 ml  Net 710 ml   Filed Weights   04/15/23 1806 04/16/23 1331  Weight: 92 kg 90.7 kg    Examination:  General exam: Appears calm and comfortable  Respiratory system: unlabored Cardiovascular system: RRR Gastrointestinal system: mild diffuse tenderness, distended, but not tense Central nervous system: Alert and oriented. No focal neurological deficits. Drain  with intact dressing to R buttocks Extremities: no LEE    Data Reviewed: I have personally reviewed following labs and imaging studies  CBC: Recent Labs  Lab 04/14/23 0624 04/15/23 1955 04/16/23 0429 04/17/23 0438  WBC 6.6 15.1* 14.1* 8.8  NEUTROABS 4.2  --   --   --   HGB 14.6 13.8 13.0 11.2*  HCT 42.2 39.6 38.2 32.9*  MCV 91.7 91.9 92.7 92.4  PLT 327 321 325 276    Basic Metabolic Panel: Recent Labs  Lab 04/14/23 0624 04/15/23 1955 04/16/23 0429 04/17/23 0438  NA 135 133* 133* 136  K 3.6 3.0* 2.9* 3.5  CL 99 98 95* 101  CO2 27 26 28 27   GLUCOSE 120* 94 76 103*  BUN 5* <5* 5* 7  CREATININE 0.71 0.79 0.73 0.66  CALCIUM 9.5 8.9 8.9 8.4*  MG 1.7  --  1.6* 1.8  PHOS  --   --  3.0  --     GFR: Estimated Creatinine Clearance: 104 mL/min (by C-G formula based on SCr of 0.66 mg/dL).  Liver Function Tests: Recent Labs  Lab 04/14/23 0624 04/15/23 1955  AST 16 12*  ALT 15 12  ALKPHOS 54 58  BILITOT 0.6 1.2  PROT 7.7 7.5  ALBUMIN 4.0 3.5    CBG: No results for input(s): GLUCAP in the last 168 hours.   Recent Results (from the past 240 hours)  Wet prep, genital     Status: Abnormal   Collection Time: 04/14/23 11:53 AM   Specimen: PATH Cytology Cervicovaginal Ancillary Only  Result Value Ref Range Status   Yeast Wet Prep HPF POC NONE SEEN NONE SEEN Final   Trich, Wet Prep NONE SEEN NONE SEEN Final   Clue Cells Wet Prep HPF POC PRESENT (Matrice Herro) NONE SEEN Final   WBC, Wet Prep HPF POC >=10 (Avigail Pilling) <10 Final   Sperm NONE SEEN  Final    Comment: Performed at [facility], [address], [place], [place] [address]  Culture, blood (Routine X 2) w Reflex to ID Panel     Status: None (Preliminary result)   Collection Time: 04/16/23  3:45 AM   Specimen: BLOOD  Result Value Ref Range Status   Specimen Description BLOOD LEFT ANTECUBITAL  Final   Special Requests   Final    BOTTLES DRAWN AEROBIC AND ANAEROBIC Blood Culture adequate volume   Culture   Final    NO GROWTH  1 DAY Performed at [facility], [address] [place], [place] [address]    Report Status PENDING  Incomplete  Culture, blood (Routine X 2) w Reflex to ID Panel     Status: None (Preliminary result)   Collection Time: 04/16/23  3:50 AM   Specimen: BLOOD  Result Value  Ref Range Status   Specimen Description BLOOD RIGHT ANTECUBITAL  Final   Special Requests   Final    BOTTLES DRAWN AEROBIC AND ANAEROBIC Blood Culture adequate volume   Culture   Final    NO GROWTH 1 DAY Performed at [facility], [address], [place], [place] [address]    Report Status PENDING  Incomplete  Aerobic/Anaerobic Culture w Gram Stain (surgical/deep wound)     Status: None (Preliminary result)   Collection Time: 04/16/23 12:08 PM   Specimen: Abscess  Result Value Ref Range Status   Specimen Description ABSCESS  Final   Special Requests fallopian tube  Final   Gram Stain   Final    RARE WBC PRESENT, PREDOMINANTLY PMN NO ORGANISMS SEEN    Culture   Final    NO GROWTH < 24 HOURS Performed at [facility], [address] [place], [place] [address]    Report Status PENDING  Incomplete         Radiology Studies: CT GUIDED PERITONEAL/RETROPERITONEAL FLUID DRAIN BY PERC CATH Result Date: 04/16/2023 INDICATION: 38 year old female referred for drainage of tubo-ovarian abscess. EXAM: CT-GUIDED DRAINAGE OF TUBO-OVARIAN ABSCESS TECHNIQUE: Multidetector CT imaging of the abdomen/pelvis was performed following the standard protocol without IV contrast. RADIATION DOSE REDUCTION: This exam was performed according to the departmental dose-optimization program which includes automated exposure control, adjustment of the mA and/or kV according to patient size and/or use of iterative reconstruction technique. MEDICATIONS: The patient is currently admitted to the hospital and receiving intravenous antibiotics. The antibiotics were administered within an appropriate time frame prior to the initiation of the  procedure. ANESTHESIA/SEDATION: Moderate (conscious) sedation was employed during this procedure. Bridgid Printz total of Versed  3.0 mg and Fentanyl  125 mcg was administered intravenously by the radiology nurse. Total intra-service moderate Sedation Time: 37 minutes. The patient's level of consciousness and vital signs were monitored continuously by radiology nursing throughout the procedure under my direct supervision. COMPLICATIONS: None PROCEDURE: Informed written consent was obtained from the patient after Kyrin Garn thorough discussion of the procedural risks, benefits and alternatives. All questions were addressed. Maximal Sterile Barrier Technique was utilized including caps, mask, sterile gowns, sterile gloves, sterile drape, hand hygiene and skin antiseptic. Faust Thorington timeout was performed prior to the initiation of the procedure. Patient was positioned left decubitus on the CT gantry table, and scout CT was acquired for planning purposes. The patient was then prepped and draped in the usual sterile fashion. 1% lidocaine  was used for local anesthesia. Using CT guidance, trocar needle was advanced into the right-sided tubo-ovarian abscess. Once we confirmed needle tip position with aspiration of small amount of serous fluid, modified Seldinger technique was used to attempt to place Inara Dike 10 French drain. Serial dilation was performed up to 10 French. The 10 French drain was advanced. The subsequent CT demonstrated that the drain had formed outside of the tubo-ovarian abscess. The drain was removed on the wire, and the 18 gauge cannula was advanced onto the wire into the existing soft tissue tract. Serial CT images were then used to again place the trocar into the tubo-ovarian abscess. Stepwise CT imaging used then to attempt to place Darl Kuss 10 French drain. Given the significant resistance along the soft tissue tract, likely related to significant inflammation as well as the density of the patient's soft tissue musculature of the pelvic outlet,  the 10 French drain would not advance beyond the initial few cm. Serial dilation of 12 French and 14 French performed, however, the  10 French drain would not advance into the abscess. Ultimately, in 8.5 French pigtail drain catheter was advanced on the wire into the tubo-ovarian abscess. Approximately 30 cc of serous fluid aspirated for Dewanda Fennema culture. The drain was formed within the abscess and Bernadette Armijo final CT was acquired. Patient tolerated the procedure well and remained hemodynamically stable throughout. No complications were encountered and no significant blood loss. IMPRESSION: Status post CT-guided placement of right transgluteal abscess drain into Decarlo Rivet right-sided tubo-ovarian abscess. Signed, Ami RAMAN. Alona ROSALEA GRAVER, RPVI [facility] Radiology Electronically Signed   By: Ami Alona D.O.   On: 04/16/2023 14:45   CT ABDOMEN PELVIS W CONTRAST Result Date: 04/16/2023 CLINICAL DATA:  Abdominal pain EXAM: CT ABDOMEN AND PELVIS WITH CONTRAST TECHNIQUE: Multidetector CT imaging of the abdomen and pelvis was performed using the standard protocol following bolus administration of intravenous contrast. RADIATION DOSE REDUCTION: This exam was performed according to the departmental dose-optimization program which includes automated exposure control, adjustment of the mA and/or kV according to patient size and/or use of iterative reconstruction technique. CONTRAST:  OMNIPAQUE  IOHEXOL  300 MG/ML  SOLN COMPARISON:  04/14/2023 no acute abnormality. FINDINGS: Lower chest: No acute abnormality Hepatobiliary: No focal hepatic abnormality. Gallbladder unremarkable. Pancreas: No focal abnormality or ductal dilatation. Spleen: No focal abnormality.  Normal size. Adrenals/Urinary Tract: No adrenal abnormality. No focal renal abnormality. No stones or hydronephrosis. Urinary bladder is unremarkable. Stomach/Bowel: There is mild wall thickening within the sigmoid colon adjacent to the  complex free fluid described below. Favor this is secondarily inflamed. No bowel obstruction. Stomach and small bowel decompressed, unremarkable. Vascular/Lymphatic: No evidence of aneurysm or adenopathy. Reproductive: Uterus unremarkable. Complex process again seen in the left ovary. Multiple cystic areas within the left ovary and left ovary appears enlarged measuring up to 6.2 cm. Findings are similar to prior study. Other: Increasing complex free fluid in the pelvis in the cul-de-sac posterior to the uterus and along the right side of the uterus. There appears to be enhancing rim within this fluid collection. Fluid collection overall measures 10.3 x 4.3 cm. Musculoskeletal: No acute bony abnormality. IMPRESSION: Increasing complex free fluid collection in the pelvis adjacent to the right side and posterior aspect of the uterus. There appears to be an enhancing rim. This could reflect complex ascites, but focal fluid collection such as tubo-ovarian abscess cannot be excluded. Adjacent sigmoid colon appears thick walled, likely secondarily inflamed. Complex process again noted in the left ovary which is unchanged since prior study. Electronically Signed   By: Franky Crease M.D.   On: 04/16/2023 02:09        Scheduled Meds:  enoxaparin  (LOVENOX ) injection  40 mg Subcutaneous Q24H   ketorolac   30 mg Intravenous Q8H   loratadine   10 mg Oral Daily   montelukast   10 mg Oral QHS   sodium chloride  flush  3 mL Intravenous Q12H   sodium chloride  flush  5 mL Intracatheter Q8H   Continuous Infusions:  cefTRIAXone  (ROCEPHIN )  IV 1 g (04/17/23 0335)   doxycycline  (VIBRAMYCIN ) IV 100 mg (04/17/23 9361)   metronidazole  500 mg (04/17/23 1610)     LOS: 1 day    Time spent: over 30 min     Meliton Monte, MD [facility]   To contact the attending provider between 7A-7P or the covering provider during after hours 7P-7A, please log into the web site [email or web address] and access using universal [facility] password for that web site. If you do  not have the password, please call the hospital operator.  04/17/2023, 4:56 PM

## 2023-04-17 NOTE — Progress Notes (Signed)
 Patient has tolerated procedure well.  Total amount of draining this shift has been approximately 40 cc.  The first 20cc was pale pink in color with small clots,the second 20cc is pale yellow in color.  Dressing reinforced earlier in shift. Patient has voided several times and ambulated to restroom without assistance. Vitalsl have been stable. No acute events overnight.

## 2023-04-18 DIAGNOSIS — N7093 Salpingitis and oophoritis, unspecified: Secondary | ICD-10-CM | POA: Diagnosis not present

## 2023-04-18 LAB — PHOSPHORUS: Phosphorus: 3.9 mg/dL (ref 2.5–4.6)

## 2023-04-18 LAB — CBC WITH DIFFERENTIAL/PLATELET
Abs Immature Granulocytes: 0.04 10*3/uL (ref 0.00–0.07)
Basophils Absolute: 0 10*3/uL (ref 0.0–0.1)
Basophils Relative: 0 %
Eosinophils Absolute: 0.4 10*3/uL (ref 0.0–0.5)
Eosinophils Relative: 4 %
HCT: 33.2 % — ABNORMAL LOW (ref 36.0–46.0)
Hemoglobin: 11 g/dL — ABNORMAL LOW (ref 12.0–15.0)
Immature Granulocytes: 1 %
Lymphocytes Relative: 25 %
Lymphs Abs: 2 10*3/uL (ref 0.7–4.0)
MCH: 31 pg (ref 26.0–34.0)
MCHC: 33.1 g/dL (ref 30.0–36.0)
MCV: 93.5 fL (ref 80.0–100.0)
Monocytes Absolute: 0.7 10*3/uL (ref 0.1–1.0)
Monocytes Relative: 9 %
Neutro Abs: 5.1 10*3/uL (ref 1.7–7.7)
Neutrophils Relative %: 61 %
Platelets: 336 10*3/uL (ref 150–400)
RBC: 3.55 MIL/uL — ABNORMAL LOW (ref 3.87–5.11)
RDW: 12.7 % (ref 11.5–15.5)
WBC: 8.3 10*3/uL (ref 4.0–10.5)
nRBC: 0 % (ref 0.0–0.2)

## 2023-04-18 LAB — COMPREHENSIVE METABOLIC PANEL
ALT: 10 U/L (ref 0–44)
AST: 12 U/L — ABNORMAL LOW (ref 15–41)
Albumin: 2.8 g/dL — ABNORMAL LOW (ref 3.5–5.0)
Alkaline Phosphatase: 45 U/L (ref 38–126)
Anion gap: 6 (ref 5–15)
BUN: 5 mg/dL — ABNORMAL LOW (ref 6–20)
CO2: 29 mmol/L (ref 22–32)
Calcium: 8.5 mg/dL — ABNORMAL LOW (ref 8.9–10.3)
Chloride: 101 mmol/L (ref 98–111)
Creatinine, Ser: 0.7 mg/dL (ref 0.44–1.00)
GFR, Estimated: >60 mL/min (ref >60)
Glucose, Bld: 97 mg/dL (ref 70–99)
Potassium: 3.5 mmol/L (ref 3.5–5.1)
Sodium: 136 mmol/L (ref 135–145)
Total Bilirubin: 0.3 mg/dL (ref 0.0–1.2)
Total Protein: 5.8 g/dL — ABNORMAL LOW (ref 6.5–8.1)

## 2023-04-18 LAB — MAGNESIUM: Magnesium: 1.7 mg/dL (ref 1.7–2.4)

## 2023-04-18 MED ORDER — SODIUM CHLORIDE 0.9 % IV SOLN: INTRAVENOUS | Status: DC | PRN

## 2023-04-18 MED ORDER — METRONIDAZOLE 500 MG PO TABS: 500.0000 mg | ORAL_TABLET | Freq: Three times a day (TID) | ORAL | 0 refills | Status: DC

## 2023-04-18 MED ORDER — LEVOFLOXACIN 750 MG PO TABS: 750.0000 mg | ORAL_TABLET | Freq: Every day | ORAL | 0 refills | Status: DC

## 2023-04-18 NOTE — Progress Notes (Signed)
 Dressing change performed as ordered to right transgluteal drain using sterile drain sponge and abd dressing.

## 2023-04-18 NOTE — Progress Notes (Addendum)
 JP drain emptied twice today with 50 and then of yellow drainage.  Dressing dry and intact  Continues to have pain rated about 7 and getting scheduled toradol .  Has been ambulating independently in room and to bathroom  Mother brought supper from [facility].  Work note from Dr. Perri given to patient

## 2023-04-18 NOTE — Consult Note (Signed)
 Reason for Consult:TOA Referring Physician: [facility]  [name] is an 38 y.o. female. [id number] Patient's last menstrual period was 03/24/2023 (exact date).  Presented to ED originally 04/14/23 with abdominal pain CT showed right ovarian cyst, treated for PID picture, returned 04/16/23 when pain was inadequately controlled and ^ WBC  Admitted for IV antibiotics and consulted IR with RTG drain placed 04/16/23 30 cc purulent fluid WBC down to 8 this am, afebrile, pt feels better an exam is pretty benign Cultured fluid no groth thus far Continues on Rocephin  doxycycline  and flagyl  Recommend switching to levaquin  + flagyl  (penicillin allergy) in am for 10 days and if tolerates should be able to be discharged home later in the day Needs IR follow up in 2 weeks for evaluation of drain removal         Past Medical History:  Diagnosis Date   No pertinent past medical history    Sciatica     Past Surgical History:  Procedure Laterality Date   CESAREAN SECTION N/A 05/19/2012   Procedure: Primary Cesarean Section;  Surgeon: Krystal Deaner, MD;  Location: [facility];  Service: Obstetrics;  Laterality: N/A;  Primary Cesarean Section   FOOT SURGERY  as child   arch corrections    Family History  Problem Relation Age of Onset   Diabetes Mellitus II Mother    Diabetes Father    Diabetes Sister    Diabetes Maternal Grandmother    Diabetes Maternal Grandfather    Diabetes Paternal Grandmother     Social History:  reports that she has never smoked. She has never used smokeless tobacco. She reports that she does not currently use drugs after having used the following drugs: Marijuana. She reports that she does not drink alcohol.  Allergies:  Allergies  Allergen Reactions   Bee Venom Hives, Shortness Of Breath, Swelling and Rash   Peanut (Diagnostic) Hives, Itching and Rash   Mangifera Indica Fruit Ext (Mango) [Mangifera Indica] Hives, Itching and Rash   Penicillins Rash     Medications: I have reviewed the patient's current medications.  Review of Systems  Blood pressure 129/71, pulse 83, temperature 97.8 F (36.6 C), temperature source Oral, resp. rate 20, height 5' 4 (1.626 m), weight 90.7 kg, last menstrual period 03/24/2023, SpO2 100%. Physical Exam Abdomen soft non distended no guarding mildlyu tender, benign  Results for orders placed or performed during the hospital encounter of 04/15/23 (from the past 48 hours)  CBC     Status: Abnormal   Collection Time: 04/17/23  4:38 AM  Result Value Ref Range   WBC 8.8 4.0 - 10.5 K/uL   RBC 3.56 (L) 3.87 - 5.11 MIL/uL   Hemoglobin 11.2 (L) 12.0 - 15.0 g/dL   HCT 67.0 (L) 63.9 - 53.9 %   MCV 92.4 80.0 - 100.0 fL   MCH 31.5 26.0 - 34.0 pg   MCHC 34.0 30.0 - 36.0 g/dL   RDW 87.4 88.4 - 84.4 %   Platelets 276 150 - 400 K/uL   nRBC 0.0 0.0 - 0.2 %    Comment: Performed at [facility], [address], [place], [place] [address]  Basic metabolic panel     Status: Abnormal   Collection Time: 04/17/23  4:38 AM  Result Value Ref Range   Sodium 136 135 - 145 mmol/L   Potassium 3.5 3.5 - 5.1 mmol/L   Chloride 101 98 - 111 mmol/L   CO2 27 22 - 32 mmol/L   Glucose, Bld 103 (  H) 70 - 99 mg/dL    Comment: Glucose reference range applies only to samples taken after fasting for at least 8 hours.   BUN 7 6 - 20 mg/dL   Creatinine, Ser 9.33 0.44 - 1.00 mg/dL   Calcium 8.4 (L) 8.9 - 10.3 mg/dL   GFR, Estimated >39 >39 mL/min    Comment: (NOTE) Calculated using the CKD-EPI Creatinine Equation (2021)    Anion gap 8 5 - 15    Comment: Performed at [facility], [address] [place], [place] [address]  Magnesium      Status: None   Collection Time: 04/17/23  4:38 AM  Result Value Ref Range   Magnesium  1.8 1.7 - 2.4 mg/dL    Comment: Performed at [facility], [address] [place], [place] [address]  CBC with Differential/Platelet     Status: Abnormal   Collection Time: 04/18/23  4:35 AM  Result Value Ref  Range   WBC 8.3 4.0 - 10.5 K/uL   RBC 3.55 (L) 3.87 - 5.11 MIL/uL   Hemoglobin 11.0 (L) 12.0 - 15.0 g/dL   HCT 66.7 (L) 63.9 - 53.9 %   MCV 93.5 80.0 - 100.0 fL   MCH 31.0 26.0 - 34.0 pg   MCHC 33.1 30.0 - 36.0 g/dL   RDW 87.2 88.4 - 84.4 %   Platelets 336 150 - 400 K/uL   nRBC 0.0 0.0 - 0.2 %   Neutrophils Relative % 61 %   Neutro Abs 5.1 1.7 - 7.7 K/uL   Lymphocytes Relative 25 %   Lymphs Abs 2.0 0.7 - 4.0 K/uL   Monocytes Relative 9 %   Monocytes Absolute 0.7 0.1 - 1.0 K/uL   Eosinophils Relative 4 %   Eosinophils Absolute 0.4 0.0 - 0.5 K/uL   Basophils Relative 0 %   Basophils Absolute 0.0 0.0 - 0.1 K/uL   Immature Granulocytes 1 %   Abs Immature Granulocytes 0.04 0.00 - 0.07 K/uL    Comment: Performed at [facility], [address] [place], [place] [address]  Comprehensive metabolic panel     Status: Abnormal   Collection Time: 04/18/23  4:35 AM  Result Value Ref Range   Sodium 136 135 - 145 mmol/L   Potassium 3.5 3.5 - 5.1 mmol/L   Chloride 101 98 - 111 mmol/L   CO2 29 22 - 32 mmol/L   Glucose, Bld 97 70 - 99 mg/dL    Comment: Glucose reference range applies only to samples taken after fasting for at least 8 hours.   BUN 5 (L) 6 - 20 mg/dL   Creatinine, Ser 9.29 0.44 - 1.00 mg/dL   Calcium 8.5 (L) 8.9 - 10.3 mg/dL   Total Protein 5.8 (L) 6.5 - 8.1 g/dL   Albumin 2.8 (L) 3.5 - 5.0 g/dL   AST 12 (L) 15 - 41 U/L   ALT 10 0 - 44 U/L   Alkaline Phosphatase 45 38 - 126 U/L   Total Bilirubin 0.3 0.0 - 1.2 mg/dL   GFR, Estimated >39 >39 mL/min    Comment: (NOTE) Calculated using the CKD-EPI Creatinine Equation (2021)    Anion gap 6 5 - 15    Comment: Performed at [facility], [address], [place], [place] [address]  Magnesium      Status: None   Collection Time: 04/18/23  4:35 AM  Result Value Ref Range   Magnesium  1.7 1.7 - 2.4 mg/dL    Comment: Performed at [facility], [address], [place],  [place] [address]  Phosphorus     Status: None   Collection Time:  04/18/23  4:35 AM  Result Value Ref Range   Phosphorus 3.9 2.5 - 4.6 mg/dL    Comment: Performed at [facility], [address], [place], [place] [address]    No results found.  Assessment/Plan: TOA s/p RTG drain placement with improvement  Recommend:  Can Stop IV abx in am and begin levaquin  750 and continue flagyl  500 TID(ideally augmentin but pt is PCN allergic)-->I placed these orders for time of discharge  Follow up in my office 2 weeks-->placed in follow up discharge section  Follow up with IR 2 weeks  Vonn VEAR Inch 04/18/2023

## 2023-04-18 NOTE — Plan of Care (Signed)
 For the most part, patient rested during the night. Drain with minimal output. Pain controlled.  Problem: Pain Management: Goal: General experience of comfort will improve Outcome: Not Progressing   Problem: Education: Goal: Knowledge of General Education information will improve Description: Including pain rating scale, medication(s)/side effects and non-pharmacologic comfort measures Outcome: Progressing   Problem: Health Behavior/Discharge Planning: Goal: Ability to manage health-related needs will improve Outcome: Progressing   Problem: Clinical Measurements: Goal: Ability to maintain clinical measurements within normal limits will improve Outcome: Progressing Goal: Will remain free from infection Outcome: Progressing Goal: Diagnostic test results will improve Outcome: Progressing Goal: Respiratory complications will improve Outcome: Progressing Goal: Cardiovascular complication will be avoided Outcome: Progressing   Problem: Activity: Goal: Risk for activity intolerance will decrease Outcome: Progressing   Problem: Nutrition: Goal: Adequate nutrition will be maintained Outcome: Progressing   Problem: Coping: Goal: Level of anxiety will decrease Outcome: Progressing   Problem: Elimination: Goal: Will not experience complications related to bowel motility Outcome: Progressing Goal: Will not experience complications related to urinary retention Outcome: Progressing   Problem: Safety: Goal: Ability to remain free from injury will improve Outcome: Progressing   Problem: Skin Integrity: Goal: Risk for impaired skin integrity will decrease Outcome: Progressing   Problem: Education: Goal: Knowledge of General Education information will improve Description: Including pain rating scale, medication(s)/side effects and non-pharmacologic comfort measures Outcome: Progressing   Problem: Health Behavior/Discharge Planning: Goal: Ability to manage health-related needs  will improve Outcome: Progressing   Problem: Clinical Measurements: Goal: Ability to maintain clinical measurements within normal limits will improve Outcome: Progressing Goal: Will remain free from infection Outcome: Progressing Goal: Diagnostic test results will improve Outcome: Progressing Goal: Respiratory complications will improve Outcome: Progressing Goal: Cardiovascular complication will be avoided Outcome: Progressing   Problem: Activity: Goal: Risk for activity intolerance will decrease Outcome: Progressing   Problem: Nutrition: Goal: Adequate nutrition will be maintained Outcome: Progressing   Problem: Coping: Goal: Level of anxiety will decrease Outcome: Progressing   Problem: Elimination: Goal: Will not experience complications related to bowel motility Outcome: Progressing Goal: Will not experience complications related to urinary retention Outcome: Progressing   Problem: Pain Management: Goal: General experience of comfort will improve Outcome: Progressing   Problem: Safety: Goal: Ability to remain free from injury will improve Outcome: Progressing   Problem: Skin Integrity: Goal: Risk for impaired skin integrity will decrease Outcome: Progressing

## 2023-04-18 NOTE — Progress Notes (Signed)
 PROGRESS NOTE    [name]  FMW:[id number] DOB: 1985-08-08 DOA: 04/15/2023 PCP: Zarwolo, Gloria, FNP  Chief Complaint  Patient presents with   Abdominal Pain    Brief Narrative:   38 y.o. female with hx prediabetes, allergies/chronic bronchitis, recent ED visit on 1/5 with worsening abdominal pain and found to have left adnexal mass, possible right hydrosalpinx, treated empirically for PID and discharged from the ED. returned 1/6 evening with worsening abdominal pain, sepsis.  Found to have likely tubo-ovarian abscess.  Assessment & Plan:   Principal Problem:   TOA (tubo-ovarian abscess)  Pelvic inflammatory disease, likely tubo-ovarian abscess Sepsis secondary to above, present on admission, improving Acute onset of lower abdominal pain progressive since 1/4.   GC/CT negative, wet prep positive clue cell, urine hcg neg p CT abdomen pelvis 1/7 with increasing complex fluid collection in the pelvis adjacent to the right side and posterior aspect of the uterus - concern for complex ascites vs TOA.  Complex process in L ovary.  EDP consulted gynecology, recommending for empiric treatment of TOA. feel patient okay to stay at [facility] for now and gynecology can see her here.   S/p right transgluteal drain placement, awaiting culture results  Discussed with Dr. Jayne today, he suggested maybe ready for d/c as early as tmrw on augmentin.  Will need IR follow up in 2 weeks.  Gyn note pending.   Scheduled APAP, toradol , prn oxy.  Dilaudid  for breakthrough   Hypokalemia - Repleted   Chronic medical problems: Prediabetes: A1c 5.8% in 10/'24 Allergies, chronic bronchitis: Continue home Singulair  Sciatica: She takes baclofen  as needed, confirmed she does not take regularly.  Hold for now     DVT prophylaxis: lovenox  Code Status: full Family Communication: mom Disposition:   Status is: Inpatient Remains inpatient appropriate because: awaiting further improvement    Consultants:  Gyn IR  Procedures:   Image guided drain placement, right transgluteal.  8.31F pigtail drain.   Antimicrobials:  Anti-infectives (From admission, onward)    Start     Dose/Rate Route Frequency Ordered Stop   04/17/23 0400  cefTRIAXone  (ROCEPHIN ) 1 g in sodium chloride  0.9 % 100 mL IVPB        1 g 200 mL/hr over 30 Minutes Intravenous Every 24 hours 04/16/23 0339     04/16/23 1800  doxycycline  (VIBRAMYCIN ) 100 mg in dextrose  5 % 250 mL IVPB        100 mg 125 mL/hr over 120 Minutes Intravenous Every 12 hours 04/16/23 0339     04/16/23 1600  metroNIDAZOLE  (FLAGYL ) IVPB 500 mg        500 mg 100 mL/hr over 60 Minutes Intravenous Every 12 hours 04/16/23 0339     04/16/23 0245  cefTRIAXone  (ROCEPHIN ) 2 g in sodium chloride  0.9 % 100 mL IVPB        2 g 200 mL/hr over 30 Minutes Intravenous  Once 04/16/23 0242 04/16/23 0454   04/16/23 0245  doxycycline  (VIBRAMYCIN ) 100 mg in dextrose  5 % 250 mL IVPB        100 mg 125 mL/hr over 120 Minutes Intravenous  Once 04/16/23 0242 04/16/23 0719   04/16/23 0245  metroNIDAZOLE  (FLAGYL ) IVPB 500 mg        500 mg 100 mL/hr over 60 Minutes Intravenous  Once 04/16/23 0242 04/16/23 0550       Subjective: No new complaints  Objective: Vitals:   04/17/23 0435 04/17/23 1358 04/17/23 2026 04/18/23 0429  BP: (!) 105/58 124/74 121/86  101/72  Pulse: 83 89 81 78  Resp: 20  18 20   Temp: 98 F (36.7 C)  98.1 F (36.7 C) 97.8 F (36.6 C)  TempSrc: Oral  Oral Oral  SpO2: 100% 100% 100% 99%  Weight:      Height:        Intake/Output Summary (Last 24 hours) at 04/18/2023 1424 Last data filed at 04/18/2023 1300 Gross per 24 hour  Intake 680 ml  Output 55 ml  Net 625 ml   Filed Weights   04/15/23 1806 04/16/23 1331  Weight: 92 kg 90.7 kg    Examination:  General: No acute distress. Cardiovascular: RRR Lungs: unlabored S/NT/ND Neurological: Alert and oriented 3. Moves all extremities 4 with equal strength. Cranial nerves II  through XII grossly intact. Extremities: No clubbing or cyanosis. No edema.   Data Reviewed: I have personally reviewed following labs and imaging studies  CBC: Recent Labs  Lab 04/14/23 0624 04/15/23 1955 04/16/23 0429 04/17/23 0438 04/18/23 0435  WBC 6.6 15.1* 14.1* 8.8 8.3  NEUTROABS 4.2  --   --   --  5.1  HGB 14.6 13.8 13.0 11.2* 11.0*  HCT 42.2 39.6 38.2 32.9* 33.2*  MCV 91.7 91.9 92.7 92.4 93.5  PLT 327 321 325 276 336    Basic Metabolic Panel: Recent Labs  Lab 04/14/23 0624 04/15/23 1955 04/16/23 0429 04/17/23 0438 04/18/23 0435  NA 135 133* 133* 136 136  K 3.6 3.0* 2.9* 3.5 3.5  CL 99 98 95* 101 101  CO2 27 26 28 27 29   GLUCOSE 120* 94 76 103* 97  BUN 5* <5* 5* 7 5*  CREATININE 0.71 0.79 0.73 0.66 0.70  CALCIUM 9.5 8.9 8.9 8.4* 8.5*  MG 1.7  --  1.6* 1.8 1.7  PHOS  --   --  3.0  --  3.9    GFR: Estimated Creatinine Clearance: 104 mL/min (by C-G formula based on SCr of 0.7 mg/dL).  Liver Function Tests: Recent Labs  Lab 04/14/23 0624 04/15/23 1955 04/18/23 0435  AST 16 12* 12*  ALT 15 12 10   ALKPHOS 54 58 45  BILITOT 0.6 1.2 0.3  PROT 7.7 7.5 5.8*  ALBUMIN 4.0 3.5 2.8*    CBG: No results for input(s): GLUCAP in the last 168 hours.   Recent Results (from the past 240 hours)  Wet prep, genital     Status: Abnormal   Collection Time: 04/14/23 11:53 AM   Specimen: PATH Cytology Cervicovaginal Ancillary Only  Result Value Ref Range Status   Yeast Wet Prep HPF POC NONE SEEN NONE SEEN Final   Trich, Wet Prep NONE SEEN NONE SEEN Final   Clue Cells Wet Prep HPF POC PRESENT ([place] Gouge) NONE SEEN Final   WBC, Wet Prep HPF POC >=10 (Dunia Pringle) <10 Final   Sperm NONE SEEN  Final    Comment: Performed at [facility], [address] [place], [place] [address]  Culture, blood (Routine X 2) w Reflex to ID Panel     Status: None (Preliminary result)   Collection Time: 04/16/23  3:45 AM   Specimen: BLOOD  Result Value Ref Range Status   Specimen Description BLOOD  LEFT ANTECUBITAL  Final   Special Requests   Final    BOTTLES DRAWN AEROBIC AND ANAEROBIC Blood Culture adequate volume   Culture   Final    NO GROWTH 2 DAYS Performed at [facility], [address] [place], [place] [address]    Report Status PENDING  Incomplete  Culture, blood (Routine X 2) w Reflex to ID Panel     Status: None (Preliminary result)   Collection Time: 04/16/23  3:50 AM   Specimen: BLOOD  Result Value Ref Range Status   Specimen Description BLOOD RIGHT ANTECUBITAL  Final   Special Requests   Final    BOTTLES DRAWN AEROBIC AND ANAEROBIC Blood Culture adequate volume   Culture   Final    NO GROWTH 2 DAYS Performed at [facility], [address] [place], [place] [address]    Report Status PENDING  Incomplete  Aerobic/Anaerobic Culture w Gram Stain (surgical/deep wound)     Status: None (Preliminary result)   Collection Time: 04/16/23 12:08 PM   Specimen: Abscess  Result Value Ref Range Status   Specimen Description ABSCESS  Final   Special Requests fallopian tube  Final   Gram Stain   Final    RARE WBC PRESENT, PREDOMINANTLY PMN NO ORGANISMS SEEN    Culture   Final    NO GROWTH 2 DAYS NO ANAEROBES ISOLATED; CULTURE IN PROGRESS FOR 5 DAYS Performed at [facility], [address] [place], [place] [address]    Report Status PENDING  Incomplete         Radiology Studies: No results found.       Scheduled Meds:  acetaminophen   1,000 mg Oral Q8H   enoxaparin  (LOVENOX ) injection  40 mg Subcutaneous Q24H   ketorolac   15 mg Intravenous Q6H   loratadine   10 mg Oral Daily   montelukast   10 mg Oral QHS   pantoprazole   40 mg Oral Daily   sodium chloride  flush  3 mL Intravenous Q12H   sodium chloride  flush  5 mL Intracatheter Q8H   Continuous Infusions:  cefTRIAXone  (ROCEPHIN )  IV Stopped (04/18/23 0423)   doxycycline  (VIBRAMYCIN ) IV 100 mg (04/18/23 0603)   metronidazole  500 mg (04/18/23 0424)     LOS: 2 days    Time spent: over 30 min      Meliton Monte, MD [facility]   To contact the attending provider between 7A-7P or the covering provider during after hours 7P-7A, please log into the web site [email or web address] and access using universal [place] password for that web site. If you do not have the password, please call the hospital operator.  04/18/2023, 2:24 PM

## 2023-04-19 ENCOUNTER — Other Ambulatory Visit (HOSPITAL_COMMUNITY): Payer: Self-pay

## 2023-04-19 DIAGNOSIS — N7093 Salpingitis and oophoritis, unspecified: Secondary | ICD-10-CM | POA: Diagnosis not present

## 2023-04-19 LAB — CBC
HCT: 33.4 % — ABNORMAL LOW (ref 36.0–46.0)
Hemoglobin: 11.4 g/dL — ABNORMAL LOW (ref 12.0–15.0)
MCH: 31.8 pg (ref 26.0–34.0)
MCHC: 34.1 g/dL (ref 30.0–36.0)
MCV: 93 fL (ref 80.0–100.0)
Platelets: 352 10*3/uL (ref 150–400)
RBC: 3.59 MIL/uL — ABNORMAL LOW (ref 3.87–5.11)
RDW: 12.8 % (ref 11.5–15.5)
WBC: 8.1 10*3/uL (ref 4.0–10.5)
nRBC: 0 % (ref 0.0–0.2)

## 2023-04-19 LAB — BASIC METABOLIC PANEL
Anion gap: 4 — ABNORMAL LOW (ref 5–15)
BUN: 5 mg/dL — ABNORMAL LOW (ref 6–20)
CO2: 29 mmol/L (ref 22–32)
Calcium: 8.4 mg/dL — ABNORMAL LOW (ref 8.9–10.3)
Chloride: 102 mmol/L (ref 98–111)
Creatinine, Ser: 0.64 mg/dL (ref 0.44–1.00)
GFR, Estimated: >60 mL/min (ref >60)
Glucose, Bld: 91 mg/dL (ref 70–99)
Potassium: 3.4 mmol/L — ABNORMAL LOW (ref 3.5–5.1)
Sodium: 135 mmol/L (ref 135–145)

## 2023-04-19 LAB — PHOSPHORUS: Phosphorus: 3.5 mg/dL (ref 2.5–4.6)

## 2023-04-19 LAB — MAGNESIUM: Magnesium: 1.7 mg/dL (ref 1.7–2.4)

## 2023-04-19 MED ORDER — OXYCODONE-ACETAMINOPHEN 5-325 MG PO TABS: 1.0000 | ORAL_TABLET | Freq: Four times a day (QID) | ORAL | 0 refills | Status: AC | PRN

## 2023-04-19 MED ORDER — POTASSIUM CHLORIDE CRYS ER 20 MEQ PO TBCR
40.0000 meq | EXTENDED_RELEASE_TABLET | Freq: Once | ORAL | Status: AC
  Administered 2023-04-19: 40 meq via ORAL
  Filled 2023-04-19: qty 2

## 2023-04-19 MED ORDER — SODIUM CHLORIDE FLUSH 0.9 % IV SOLN
INTRAVENOUS | 3 refills | Status: DC
  Filled 2023-04-19: qty 150, 30d supply, fill #0

## 2023-04-19 MED ORDER — LEVOFLOXACIN 750 MG PO TABS: 750.0000 mg | ORAL_TABLET | Freq: Every day | ORAL | 0 refills | Status: DC

## 2023-04-19 NOTE — Plan of Care (Signed)

## 2023-04-19 NOTE — Progress Notes (Signed)
 Patient and husband educated on proper management of drain. Demonstrated on flushing, emptying, and charging bulb. Questions answered.

## 2023-04-19 NOTE — Discharge Summary (Signed)
 Physician Discharge Summary  [name] FMW:[id number] DOB: [birth year] DOA: 04/15/2023  PCP: Zarwolo, Gloria, FNP  Admit date: 04/15/2023 Discharge date: 04/19/2023  Time spent: 40 minutes  Recommendations for Outpatient Follow-up:  Follow outpatient CBC/CMP  Follow with gynecology and interventional radiology outpatient   Discharge Diagnoses:  Principal Problem:   TOA (tubo-ovarian abscess)   Discharge Condition: stable  Diet recommendation: heart healthy  Filed Weights   04/15/23 1806 04/16/23 1331  Weight: 92 kg 90.7 kg    History of present illness:   38 y.o. female with hx prediabetes, allergies/chronic bronchitis, recent ED visit on 1/5 with worsening abdominal pain and found to have left adnexal mass, possible right hydrosalpinx, treated empirically for PID and discharged from the ED. returned 1/6 evening with worsening abdominal pain, sepsis. Found to have likely tubo-ovarian abscess.   Now s/p IR drain.  Improved with drain and antibiotics.  Gyn planning to follow outpatient.  Hospital Course:  Assessment and Plan:  Pelvic inflammatory disease, likely tubo-ovarian abscess Sepsis secondary to above, present on admission, improving Acute onset of lower abdominal pain progressive since 1/4.   GC/CT negative, wet prep positive clue cell, urine hcg neg p CT abdomen pelvis 1/7 with increasing complex fluid collection in the pelvis adjacent to the right side and posterior aspect of the uterus - concern for complex ascites vs TOA.  Complex process in L ovary.  EDP consulted gynecology, recommending for empiric treatment of TOA. feel patient okay to stay at [facility] for now and gynecology can see her here.   S/p right transgluteal drain placement, awaiting culture results  Discussed with Dr. Jayne.  Plan for discharge on levaquin /flagyl  (pcn allergy).  Outpatient IR and gyn follow up. Scheduled APAP, toradol , prn oxy.  Dilaudid  for breakthrough   Hypokalemia -  Repleted   Chronic medical problems: Prediabetes: A1c 5.8% in 10/'24 Allergies, chronic bronchitis: Continue home Singulair  Sciatica: She takes baclofen  as needed, confirmed she does not take regularly.  Hold for now    Procedures:  CT-guided placement of right transgluteal abscess drain into [name] right-sided tubo-ovarian abscess.   Consultations: IR gyn  Discharge Exam: Vitals:   04/18/23 2053 04/19/23 0427  BP: 118/79 121/76  Pulse: 73 65  Resp: 20 18  Temp: 98.8 F (37.1 C) 98.3 F (36.8 C)  SpO2: 100% 98%   Feeling better Eager to discharge if possible  General: No acute distress. Cardiovascular: RRR Lungs: unlabored Abdomen: Soft, nontender, nondistended  Drain in R buttocks, with purulent drainage Neurological: Alert and oriented 3. Moves all extremities 4 with equal strength. Cranial nerves II through XII grossly intact. Extremities: No clubbing or cyanosis. No edema.   Discharge Instructions   Discharge Instructions     Diet - low sodium heart healthy   Complete by: As directed    Discharge instructions   Complete by: As directed    You were seen for [name] tuboovarian abscess.  You've been treated with drainage and antibiotics.  You've improved and we'll send you with [name] plan to continue antibiotics and follow up with interventional radiology and gynecology as an outpatient.  Return for new, recurrent, or worsening symptoms.  Please ask your PCP to request records from this hospitalization so they know what was done and what the next steps will be.   Discharge wound care:   Complete by: As directed    Per IR, follow interventional radiology's recommendations for wound care   Increase activity slowly   Complete by: As directed  Allergies as of 04/19/2023       Reactions   Bee Venom Hives, Shortness Of Breath, Swelling, Rash   Peanut (diagnostic) Hives, Itching, Rash   Mangifera Indica Fruit Ext (mango) [mangifera Indica] Hives, Itching, Rash    Penicillins Rash        Medication List     STOP taking these medications    doxycycline  100 MG capsule Commonly known as: VIBRAMYCIN        TAKE these medications    baclofen  10 MG tablet Commonly known as: LIORESAL  Take 10 mg by mouth every 8 (eight) hours as needed for muscle spasms.   fluticasone  50 MCG/ACT nasal spray Commonly known as: FLONASE  Place 2 sprays into both nostrils daily. What changed: when to take this   levocetirizine 5 MG tablet Commonly known as: XYZAL  Take 1 tablet (5 mg total) by mouth every evening.   levofloxacin  750 MG tablet Commonly known as: Levaquin  Take 1 tablet (750 mg total) by mouth daily.   metroNIDAZOLE  500 MG tablet Commonly known as: Flagyl  Take 1 tablet (500 mg total) by mouth 3 (three) times daily. What changed: when to take this   montelukast  10 MG tablet Commonly known as: SINGULAIR  Take 1 tablet (10 mg total) by mouth at bedtime.   naproxen  500 MG tablet Commonly known as: NAPROSYN  Take 1 tablet (500 mg total) by mouth 2 (two) times daily with [name] meal.   ondansetron  4 MG disintegrating tablet Commonly known as: ZOFRAN -ODT Take 1 tablet (4 mg total) by mouth every 8 (eight) hours as needed for nausea or vomiting.   oxyCODONE -acetaminophen  5-325 MG tablet Commonly known as: PERCOCET/ROXICET Take 1 tablet by mouth every 6 (six) hours as needed for up to 3 days for severe pain (pain score 7-10).   Vitamin D  (Ergocalciferol ) 1.25 MG (50000 UNIT) Caps capsule Commonly known as: DRISDOL  TAKE 1 CAPSULE BY MOUTH EVERY 7 DAYS What changed: when to take this               Discharge Care Instructions  (From admission, onward)           Start     Ordered   04/19/23 0000  Discharge wound care:       Comments: Per IR, follow interventional radiology's recommendations for wound care   04/19/23 1412           Allergies  Allergen Reactions   Bee Venom Hives, Shortness Of Breath, Swelling and Rash    Peanut (Diagnostic) Hives, Itching and Rash   Mangifera Indica Fruit Ext (Mango) [Mangifera Indica] Hives, Itching and Rash   Penicillins Rash    Follow-up Information     Teresa Teresa DEL, MD Follow up in 2 week(s).   Specialties: Obstetrics and Gynecology, Radiology Why: follow up [facility] Contact information: [address] [place] [address] [phone]         [facility] IR Imaging Follow up.   Specialty: Radiology Why: IR scheduler will call you to set up follow up appointment date/time, typically about Samreet [place] week after you are discharged from the hospital. Please call [phone] Monday-Friday between 8a-5p with non urgent questions/concerns. Please call [phone] with urgent concerns 24/7 Contact information: [address] [place]  [address] [phone]                 The results of significant diagnostics from this hospitalization (including imaging, microbiology, ancillary and laboratory) are listed below for reference.    Significant  Diagnostic Studies: CT GUIDED PERITONEAL/RETROPERITONEAL FLUID DRAIN BY PERC CATH Result Date: 04/16/2023 INDICATION: 38 year old female referred for drainage of tubo-ovarian abscess. EXAM: CT-GUIDED DRAINAGE OF TUBO-OVARIAN ABSCESS TECHNIQUE: Multidetector CT imaging of the abdomen/pelvis was performed following the standard protocol without IV contrast. RADIATION DOSE REDUCTION: This exam was performed according to the departmental dose-optimization program which includes automated exposure control, adjustment of the mA and/or kV according to patient size and/or use of iterative reconstruction technique. MEDICATIONS: The patient is currently admitted to the hospital and receiving intravenous antibiotics. The antibiotics were administered within an appropriate time frame prior to the initiation of the procedure. ANESTHESIA/SEDATION: Moderate (conscious) sedation was employed during this procedure. Genifer Lazenby total  of Versed  3.0 mg and Fentanyl  125 mcg was administered intravenously by the radiology nurse. Total intra-service moderate Sedation Time: 37 minutes. The patient's level of consciousness and vital signs were monitored continuously by radiology nursing throughout the procedure under my direct supervision. COMPLICATIONS: None PROCEDURE: Informed written consent was obtained from the patient after Dalton Mille thorough discussion of the procedural risks, benefits and alternatives. All questions were addressed. Maximal Sterile Barrier Technique was utilized including caps, mask, sterile gowns, sterile gloves, sterile drape, hand hygiene and skin antiseptic. Riyaan Heroux timeout was performed prior to the initiation of the procedure. Patient was positioned left decubitus on the CT gantry table, and scout CT was acquired for planning purposes. The patient was then prepped and draped in the usual sterile fashion. 1% lidocaine  was used for local anesthesia. Using CT guidance, trocar needle was advanced into the right-sided tubo-ovarian abscess. Once we confirmed needle tip position with aspiration of small amount of serous fluid, modified Seldinger technique was used to attempt to place Elmarie Devlin 10 French drain. Serial dilation was performed up to 10 French. The 10 French drain was advanced. The subsequent CT demonstrated that the drain had formed outside of the tubo-ovarian abscess. The drain was removed on the wire, and the 18 gauge cannula was advanced onto the wire into the existing soft tissue tract. Serial CT images were then used to again place the trocar into the tubo-ovarian abscess. Stepwise CT imaging used then to attempt to place Lindsee Labarre 10 French drain. Given the significant resistance along the soft tissue tract, likely related to significant inflammation as well as the density of the patient's soft tissue musculature of the pelvic outlet, the 10 French drain would not advance beyond the initial few cm. Serial dilation of 12 French and 14 French  performed, however, the 10 French drain would not advance into the abscess. Ultimately, in 8.5 French pigtail drain catheter was advanced on the wire into the tubo-ovarian abscess. Approximately 30 cc of serous fluid aspirated for Kiyona Mcnall culture. The drain was formed within the abscess and Stephaney Steven final CT was acquired. Patient tolerated the procedure well and remained hemodynamically stable throughout. No complications were encountered and no significant blood loss. IMPRESSION: Status post CT-guided placement of right transgluteal abscess drain into Kimetha Trulson right-sided tubo-ovarian abscess. Signed, Ami RAMAN. Alona ROSALEA GRAVER, RPVI [facility] Radiology Electronically Signed   By: Ami Alona D.O.   On: 04/16/2023 14:45   CT ABDOMEN PELVIS W CONTRAST Result Date: 04/16/2023 CLINICAL DATA:  Abdominal pain EXAM: CT ABDOMEN AND PELVIS WITH CONTRAST TECHNIQUE: Multidetector CT imaging of the abdomen and pelvis was performed using the standard protocol following bolus administration of intravenous contrast. RADIATION DOSE REDUCTION: This exam was performed according to the departmental dose-optimization program which includes automated exposure control, adjustment of  the mA and/or kV according to patient size and/or use of iterative reconstruction technique. CONTRAST:  OMNIPAQUE  IOHEXOL  300 MG/ML  SOLN COMPARISON:  04/14/2023 no acute abnormality. FINDINGS: Lower chest: No acute abnormality Hepatobiliary: No focal hepatic abnormality. Gallbladder unremarkable. Pancreas: No focal abnormality or ductal dilatation. Spleen: No focal abnormality.  Normal size. Adrenals/Urinary Tract: No adrenal abnormality. No focal renal abnormality. No stones or hydronephrosis. Urinary bladder is unremarkable. Stomach/Bowel: There is mild wall thickening within the sigmoid colon adjacent to the complex free fluid described below. Favor this is secondarily inflamed. No bowel obstruction. Stomach and  small bowel decompressed, unremarkable. Vascular/Lymphatic: No evidence of aneurysm or adenopathy. Reproductive: Uterus unremarkable. Complex process again seen in the left ovary. Multiple cystic areas within the left ovary and left ovary appears enlarged measuring up to 6.2 cm. Findings are similar to prior study. Other: Increasing complex free fluid in the pelvis in the cul-de-sac posterior to the uterus and along the right side of the uterus. There appears to be enhancing rim within this fluid collection. Fluid collection overall measures 10.3 x 4.3 cm. Musculoskeletal: No acute bony abnormality. IMPRESSION: Increasing complex free fluid collection in the pelvis adjacent to the right side and posterior aspect of the uterus. There appears to be an enhancing rim. This could reflect complex ascites, but focal fluid collection such as tubo-ovarian abscess cannot be excluded. Adjacent sigmoid colon appears thick walled, likely secondarily inflamed. Complex process again noted in the left ovary which is unchanged since prior study. Electronically Signed   By: Franky Crease M.D.   On: 04/16/2023 02:09   US  PELVIC COMPLETE W TRANSVAGINAL AND TORSION R/O Result Date: 04/14/2023 CLINICAL DATA:  Abdominal pain. The patient's last menstrual period was 03/23/2024. EXAM: TRANSABDOMINAL AND TRANSVAGINAL ULTRASOUND OF PELVIS DOPPLER ULTRASOUND OF OVARIES TECHNIQUE: Both transabdominal and transvaginal ultrasound examinations of the pelvis were performed. Transabdominal technique was performed for global imaging of the pelvis including uterus, ovaries, adnexal regions, and pelvic cul-de-sac. It was necessary to proceed with endovaginal exam following the transabdominal exam to visualize the adnexa. Color and duplex Doppler ultrasound was utilized to evaluate blood flow to the ovaries. COMPARISON:  Same day CT abdomen and pelvis. FINDINGS: Uterus Measurements: 9.6 x 4.8 x 6.6 cm = volume: 160 mL. No fibroids or other mass  visualized. Endometrium Thickness: 10 mm.  No focal abnormality visualized. Right ovary Measurements: 3.7 x 2.8 x 3.5 cm = volume: 19 mL. An ovarian cyst measures 2.0 x 1.9 x 2.1 cm. No imaging follow-up is recommended for this finding. Left ovary Measurements: 5.5 x 3.5 x 4.6 cm = volume: 46 mL. Daaiyah Baumert cyst in the left ovary with Chelise Hanger diffusely thick wall, crenulated appearance, and peripheral blood flow measures 3.7 x 2.2 x 2.8 cm. Pulsed Doppler evaluation of both ovaries demonstrates normal low-resistance arterial and venous waveforms. Other findings Small free fluid. IMPRESSION: Cyst in the left ovary likely represents Travelle Mcclimans corpus luteum, however follow-up pelvic ultrasound is recommended in 6-12 weeks to document resolution. Small volume free fluid in the pelvis. Electronically Signed   By: Norman Hopper M.D.   On: 04/14/2023 10:42   CT ABDOMEN PELVIS W CONTRAST Result Date: 04/14/2023 CLINICAL DATA:  Abdominal pain, acute, nonlocalized. LMP 2 weeks ago. EXAM: CT ABDOMEN AND PELVIS WITH CONTRAST TECHNIQUE: Multidetector CT imaging of the abdomen and pelvis was performed using the standard protocol following bolus administration of intravenous contrast. RADIATION DOSE REDUCTION: This exam was performed according to the departmental dose-optimization program which includes  automated exposure control, adjustment of the mA and/or kV according to patient size and/or use of iterative reconstruction technique. CONTRAST:  OMNIPAQUE  IOHEXOL  300 MG/ML  SOLN COMPARISON:  None Available. FINDINGS: Lower chest: Clear lung bases. No significant pleural or pericardial effusion. Hepatobiliary: The liver is normal in density without suspicious focal abnormality. No evidence of gallstones, gallbladder wall thickening or biliary dilatation. Pancreas: Unremarkable. No pancreatic ductal dilatation or surrounding inflammatory changes. Spleen: Normal in size without focal abnormality. Small splenules noted. Adrenals/Urinary Tract: Both  adrenal glands appear normal. No evidence of urinary tract calculus, suspicious renal lesion or hydronephrosis. The bladder appears unremarkable for its degree of distention. Stomach/Bowel: No enteric contrast administered. The stomach appears unremarkable for its degree of distension. No evidence of bowel wall thickening, distention or surrounding inflammatory change. The appendix appears normal. Vascular/Lymphatic: There are no enlarged abdominal or pelvic lymph nodes. No significant vascular findings. Reproductive: Heterogeneous left adnexal mass measures 6.6 x 4.3 x 4.2 cm. There is surrounding soft tissue stranding. Possible hydrosalpinx on the right. The uterus appears unremarkable. There is Elexus Barman moderate amount free pelvic fluid. Other: Broad-based umbilical hernia containing only fat. No herniated bowel. No generalized abdominal ascites or pneumoperitoneum. Musculoskeletal: No acute or significant osseous findings. Lumbar facet arthropathy noted. IMPRESSION: 1. Heterogeneous left adnexal mass with surrounding soft tissue stranding and moderate amount of free pelvic fluid. Findings are suspicious for Omauri Boeve ruptured ovarian cyst or pelvic inflammatory disease. Differential includes ovarian torsion and ectopic pregnancy. Recommend pelvic ultrasound for further evaluation (already scheduled). 2. Possible hydrosalpinx on the right. 3. No evidence of bowel obstruction or perforation. The appendix appears normal. 4. Broad-based umbilical hernia containing only fat. Electronically Signed   By: Elsie Perone M.D.   On: 04/14/2023 09:20    Microbiology: Recent Results (from the past 240 hours)  Wet prep, genital     Status: Abnormal   Collection Time: 04/14/23 11:53 AM   Specimen: PATH Cytology Cervicovaginal Ancillary Only  Result Value Ref Range Status   Yeast Wet Prep HPF POC NONE SEEN NONE SEEN Final   Trich, Wet Prep NONE SEEN NONE SEEN Final   Clue Cells Wet Prep HPF POC PRESENT (Mickie Kozikowski) NONE SEEN Final   WBC,  Wet Prep HPF POC >=10 (Lanecia Sliva) <10 Final   Sperm NONE SEEN  Final    Comment: Performed at [facility], [address], [place], [place] [address]  Culture, blood (Routine X 2) w Reflex to ID Panel     Status: None (Preliminary result)   Collection Time: 04/16/23  3:45 AM   Specimen: BLOOD  Result Value Ref Range Status   Specimen Description BLOOD LEFT ANTECUBITAL  Final   Special Requests   Final    BOTTLES DRAWN AEROBIC AND ANAEROBIC Blood Culture adequate volume   Culture   Final    NO GROWTH 3 DAYS Performed at [facility], [address] [place], [place] [address]    Report Status PENDING  Incomplete  Culture, blood (Routine X 2) w Reflex to ID Panel     Status: None (Preliminary result)   Collection Time: 04/16/23  3:50 AM   Specimen: BLOOD  Result Value Ref Range Status   Specimen Description BLOOD RIGHT ANTECUBITAL  Final   Special Requests   Final    BOTTLES DRAWN AEROBIC AND ANAEROBIC Blood Culture adequate volume   Culture   Final    NO GROWTH 3 DAYS Performed at [facility], [address] [place], [place] [address]  Report Status PENDING  Incomplete  Aerobic/Anaerobic Culture w Gram Stain (surgical/deep wound)     Status: None (Preliminary result)   Collection Time: 04/16/23 12:08 PM   Specimen: Abscess  Result Value Ref Range Status   Specimen Description ABSCESS  Final   Special Requests fallopian tube  Final   Gram Stain   Final    RARE WBC PRESENT, PREDOMINANTLY PMN NO ORGANISMS SEEN    Culture   Final    NO GROWTH 3 DAYS NO ANAEROBES ISOLATED; CULTURE IN PROGRESS FOR 5 DAYS Performed at [facility], [address] [place], [place] [address]    Report Status PENDING  Incomplete     Labs: Basic Metabolic Panel: Recent Labs  Lab 04/14/23 0624 04/15/23 1955 04/16/23 0429 04/17/23 0438 04/18/23 0435 04/19/23 0418  NA 135 133* 133* 136 136 135  K 3.6 3.0* 2.9* 3.5 3.5 3.4*  CL 99 98 95* 101 101 102  CO2 27 26 28 27 29 29   GLUCOSE 120* 94  76 103* 97 91  BUN 5* <5* 5* 7 5* 5*  CREATININE 0.71 0.79 0.73 0.66 0.70 0.64  CALCIUM 9.5 8.9 8.9 8.4* 8.5* 8.4*  MG 1.7  --  1.6* 1.8 1.7 1.7  PHOS  --   --  3.0  --  3.9 3.5   Liver Function Tests: Recent Labs  Lab 04/14/23 0624 04/15/23 1955 04/18/23 0435  AST 16 12* 12*  ALT 15 12 10   ALKPHOS 54 58 45  BILITOT 0.6 1.2 0.3  PROT 7.7 7.5 5.8*  ALBUMIN 4.0 3.5 2.8*   Recent Labs  Lab 04/14/23 0624 04/15/23 1955  LIPASE 38 21   No results for input(s): AMMONIA in the last 168 hours. CBC: Recent Labs  Lab 04/14/23 0624 04/15/23 1955 04/16/23 0429 04/17/23 0438 04/18/23 0435 04/19/23 0418  WBC 6.6 15.1* 14.1* 8.8 8.3 8.1  NEUTROABS 4.2  --   --   --  5.1  --   HGB 14.6 13.8 13.0 11.2* 11.0* 11.4*  HCT 42.2 39.6 38.2 32.9* 33.2* 33.4*  MCV 91.7 91.9 92.7 92.4 93.5 93.0  PLT 327 321 325 276 336 352   Cardiac Enzymes: No results for input(s): CKTOTAL, CKMB, CKMBINDEX, TROPONINI in the last 168 hours. BNP: BNP (last 3 results) No results for input(s): BNP in the last 8760 hours.  ProBNP (last 3 results) No results for input(s): PROBNP in the last 8760 hours.  CBG: No results for input(s): GLUCAP in the last 168 hours.     Signed:  Meliton Monte MD.  [facility] 04/19/2023, 2:15 PM

## 2023-04-19 NOTE — Progress Notes (Signed)
 Off site IR rounding note.  Chart reviewed, discussed with RN - no drain concerns, output has been appropriate.  Outpatient orders/discharge instructions placed in AVS and are as follows:  - Flush drain every day with 5 cc NS - Record output every day - Dressing changes every 2-3 days or earlier if soiled - May shower with drain in place as long as covered with water tight dressing, do not submerge (no baths, swimming, sauna, etc) - 10-14 day follow up at IR clinic. IR scheduler will call patient to set this up after discharge - IR patient care line [phone] for questions/concerns 24/7  Appreciate RN providing drain education to patient.  Please call on call IR MD over the weekend with questions or concerns.  Clotilda Hesselbach, PA-C

## 2023-04-21 LAB — CULTURE, BLOOD (ROUTINE X 2)
Culture: NO GROWTH
Culture: NO GROWTH
Special Requests: ADEQUATE
Special Requests: ADEQUATE

## 2023-04-21 LAB — AEROBIC/ANAEROBIC CULTURE W GRAM STAIN (SURGICAL/DEEP WOUND): Culture: NO GROWTH

## 2023-04-29 ENCOUNTER — Ambulatory Visit (HOSPITAL_COMMUNITY)
Admission: RE | Admit: 2023-04-29 | Discharge: 2023-04-29 | Disposition: A | Discharge status: Home / Self Care | Payer: Medicaid Other | Source: Ambulatory Visit | Attending: Radiology | Admitting: Radiology

## 2023-04-29 ENCOUNTER — Telehealth: Payer: Self-pay | Admitting: Radiology

## 2023-04-29 ENCOUNTER — Encounter: Payer: Self-pay | Admitting: Family Medicine

## 2023-04-29 ENCOUNTER — Telehealth: Payer: Self-pay | Admitting: Obstetrics & Gynecology

## 2023-04-29 DIAGNOSIS — N7093 Salpingitis and oophoritis, unspecified: Secondary | ICD-10-CM | POA: Diagnosis not present

## 2023-04-29 DIAGNOSIS — N3289 Other specified disorders of bladder: Secondary | ICD-10-CM | POA: Diagnosis not present

## 2023-04-29 MED ORDER — IOHEXOL 350 MG/ML SOLN
75.0000 mL | Freq: Once | INTRAVENOUS | Status: AC | PRN
  Administered 2023-04-29: 75 mL via INTRAVENOUS

## 2023-04-29 NOTE — Telephone Encounter (Signed)
Patient calling stating that the tube has come out but still attached ct done at [facility] and patient is wanting to know what she needs to do now asking for you to call her [phone] and or mother's number [phone]

## 2023-04-29 NOTE — Telephone Encounter (Signed)
   Talked with Pt by phone this am She states TG abscess drain has come out at 5 am today  Was placed by Dr Loreta Ave 04/16/23 IMPRESSION: Status post CT-guided placement of right transgluteal abscess drain into a right-sided tubo-ovarian abscess.  States she is afeb; no pain Minimal OP  Discussed with Dr Fredia Sorrow Plan:  new OP CT asap CT Pelvis with Cx Message to [facility] for CT appt  May not need to replace drain at all   Pt will hear from scheduler for CT  Pt is aware and agreeable

## 2023-04-30 ENCOUNTER — Ambulatory Visit: Payer: Medicaid Other | Admitting: Obstetrics & Gynecology

## 2023-04-30 ENCOUNTER — Encounter: Payer: Self-pay | Admitting: Obstetrics & Gynecology

## 2023-04-30 ENCOUNTER — Telehealth: Payer: Self-pay | Admitting: Radiology

## 2023-04-30 VITALS — BP 123/87 | HR 90 | Ht 64.0 in | Wt 199.0 lb

## 2023-04-30 DIAGNOSIS — N7093 Salpingitis and oophoritis, unspecified: Secondary | ICD-10-CM

## 2023-04-30 NOTE — Telephone Encounter (Signed)
   IR Note  CT done yesterday Reviewed with Dr Elby Showers No indication for drain replacement at this time Rec: follow up with GYN  I called pt She is pleased to hear of CT scan result She has appointment with GYN today at 300 pm. She states she will have Gyn cut stitch at site that is holding drain to skin

## 2023-04-30 NOTE — Progress Notes (Signed)
Follow up appointment for results: IR CT drain  Chief Complaint  Patient presents with   Follow-up    Blood pressure 123/87, pulse 90, height 5\' 4"  (1.626 m), weight 199 lb (90.3 kg), last menstrual period 03/24/2023.  Isaw the [name] at [facility] for consult TOA S/P IR drain placement Discharged home on po Levaquin  [name] is without complaints Exam abdomen is soft benign non tender   CLINICAL DATA:  Tubo-ovarian abscess, status post percutaneous drain catheter placement 04/16/2023   EXAM: CT PELVIS WITH CONTRAST   TECHNIQUE: Multidetector CT imaging of the pelvis was performed using the standard protocol following the bolus administration of intravenous contrast.   RADIATION DOSE REDUCTION: This exam was performed according to the departmental dose-optimization program which includes automated exposure control, adjustment of the mA and/or kV according to patient size and/or use of iterative reconstruction technique.   CONTRAST:  75mL OMNIPAQUE IOHEXOL 350 MG/ML SOLN   COMPARISON:  04/16/2023   FINDINGS: Urinary Tract: Distal ureters nondistended. Urinary bladder decompressed, with circumferential moderate wall thickening.   Bowel: Normal appendix noted. Visualized loops of small and large bowel unremarkable.   Vascular/Lymphatic: No pathologically enlarged lymph nodes. No significant vascular abnormality seen.   Reproductive: Uterus unremarkable. No adnexal mass. The small caliber right transgluteal pigtail drain catheter placed previously has been removed. There has been significant resolution of the multiloculated right adnexal process, with a small bilobed residual component measuring 3.7 cm X 1.5 cm. Scattered smaller cystic collections in the left adnexal region, largest 2.3 x 1.8 cm, previously 2.5 cm. No new or enlarging collection.   Other:  No free fluid.  No pelvic free air.   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   IMPRESSION: 1. Significant  resolution of right adnexal process, with small residual bilobed component measuring 3.7 cm x 1.5 cm. 2. Scattered smaller cystic collections in the left adnexal region, largest 2.3 x 1.8 cm, previously 2.5 cm. 3. Moderate bladder wall thickening.     Electronically Signed   By: Corlis Leak M.D.   On: 04/29/2023 15:13  MEDS ordered this encounter: No orders of the defined types were placed in this encounter.   Orders for this encounter: No orders of the defined types were placed in this encounter.   Impression + Management Plan   ICD-10-CM   1. Tubo-ovarian abscess: S/P Abx + IR drain 04/15/22, now removed  N70.93       Follow Up: Return if symptoms worsen or fail to improve.     All questions were answered.  Past Medical History:  Diagnosis Date   No pertinent past medical history    Sciatica     Past Surgical History:  Procedure Laterality Date   CESAREAN SECTION N/A 05/19/2012   Procedure: Primary Cesarean Section;  Surgeon: Lavina Hamman, MD;  Location: [facility];  Service: Obstetrics;  Laterality: N/A;  Primary Cesarean Section   FOOT SURGERY  as child   arch corrections    OB History     Gravida  3   Para  3   Term  3   Preterm  0   AB  0   Living  2      SAB  0   IAB  0   Ectopic  0   Multiple  0   Live Births              Allergies  Allergen Reactions   Bee Venom Hives, Shortness Of Breath, Swelling  and Rash   Peanut (Diagnostic) Hives, Itching and Rash   Mangifera Indica Fruit Ext (Mango) [Mangifera Indica] Hives, Itching and Rash   Penicillins Rash    Social History   Socioeconomic History   Marital status: Significant Other    Spouse name: Not on file   Number of children: Not on file   Years of education: Not on file   Highest education level: 12th grade  Occupational History   Not on file  Tobacco Use   Smoking status: Never   Smokeless tobacco: Never  Vaping Use   Vaping status: Never Used  Substance and  Sexual Activity   Alcohol use: No   Drug use: Not Currently    Types: Marijuana   Sexual activity: Yes  Other Topics Concern   Not on file  Social History Narrative   Not on file   Social Drivers of Health   Financial Resource Strain: High Risk (03/06/2023)   Overall Financial Resource Strain (CARDIA)    Difficulty of Paying Living Expenses: Hard  Food Insecurity: No Food Insecurity (04/16/2023)   Hunger Vital Sign    Worried About Running Out of Food in the Last Year: Never true    Ran Out of Food in the Last Year: Never true  Recent Concern: Food Insecurity - Food Insecurity Present (03/06/2023)   Hunger Vital Sign    Worried About Running Out of Food in the Last Year: Sometimes true    Ran Out of Food in the Last Year: Never true  Transportation Needs: No Transportation Needs (04/16/2023)   PRAPARE - Administrator, Civil Service (Medical): No    Lack of Transportation (Non-Medical): No  Physical Activity: Inactive (03/06/2023)   Exercise Vital Sign    Days of Exercise per Week: 0 days    Minutes of Exercise per Session: 10 min  Stress: No Stress Concern Present (03/06/2023)   [employer] of Occupational Health - Occupational Stress Questionnaire    Feeling of Stress : Only a little  Social Connections: Moderately Isolated (03/06/2023)   Social Connection and Isolation Panel [NHANES]    Frequency of Communication with Friends and Family: More than three times a week    Frequency of Social Gatherings with Friends and Family: Once a week    Attends Religious Services: Never    Database administrator or Organizations: No    Attends Engineer, structural: Not on file    Marital Status: Living with partner    Family History  Problem Relation Age of Onset   Diabetes Mellitus II Mother    Diabetes Father    Diabetes Sister    Diabetes Maternal Grandmother    Diabetes Maternal Grandfather    Diabetes Paternal Grandmother

## 2023-05-01 ENCOUNTER — Other Ambulatory Visit (HOSPITAL_COMMUNITY): Payer: Self-pay

## 2023-05-03 ENCOUNTER — Other Ambulatory Visit (HOSPITAL_COMMUNITY): Payer: Self-pay

## 2023-05-11 DIAGNOSIS — Z419 Encounter for procedure for purposes other than remedying health state, unspecified: Secondary | ICD-10-CM | POA: Diagnosis not present

## 2023-06-01 ENCOUNTER — Other Ambulatory Visit: Payer: Self-pay | Admitting: Physician Assistant

## 2023-06-01 ENCOUNTER — Other Ambulatory Visit: Payer: Self-pay | Admitting: Family Medicine

## 2023-06-01 ENCOUNTER — Encounter: Payer: Self-pay | Admitting: Family Medicine

## 2023-06-01 DIAGNOSIS — R6889 Other general symptoms and signs: Secondary | ICD-10-CM

## 2023-06-03 ENCOUNTER — Other Ambulatory Visit: Payer: Self-pay | Admitting: Family Medicine

## 2023-06-03 DIAGNOSIS — E559 Vitamin D deficiency, unspecified: Secondary | ICD-10-CM

## 2023-06-03 DIAGNOSIS — E038 Other specified hypothyroidism: Secondary | ICD-10-CM

## 2023-06-03 DIAGNOSIS — J302 Other seasonal allergic rhinitis: Secondary | ICD-10-CM

## 2023-06-03 DIAGNOSIS — R7301 Impaired fasting glucose: Secondary | ICD-10-CM

## 2023-06-03 DIAGNOSIS — E7849 Other hyperlipidemia: Secondary | ICD-10-CM

## 2023-06-03 DIAGNOSIS — M79605 Pain in left leg: Secondary | ICD-10-CM

## 2023-06-03 MED ORDER — LEVOCETIRIZINE DIHYDROCHLORIDE 5 MG PO TABS: 5.0000 mg | ORAL_TABLET | Freq: Every evening | ORAL | 2 refills | Status: DC

## 2023-06-03 MED ORDER — VITAMIN D (ERGOCALCIFEROL) 1.25 MG (50000 UNIT) PO CAPS
50000.0000 [IU] | ORAL_CAPSULE | ORAL | 2 refills | Status: AC
Start: 2023-06-03 — End: ?

## 2023-06-03 MED ORDER — BACLOFEN 10 MG PO TABS
10.0000 mg | ORAL_TABLET | Freq: Three times a day (TID) | ORAL | 0 refills | Status: DC | PRN
Start: 2023-06-03 — End: 2023-09-03

## 2023-06-03 MED ORDER — FLUTICASONE PROPIONATE 50 MCG/ACT NA SUSP: 2.0000 | Freq: Every day | NASAL | 0 refills | Status: DC

## 2023-06-03 NOTE — Telephone Encounter (Signed)
 Please advise the patient to return for labs to assess her hemoglobin A1c

## 2023-06-04 ENCOUNTER — Telehealth: Payer: Medicaid Other | Admitting: Physician Assistant

## 2023-06-04 DIAGNOSIS — A084 Viral intestinal infection, unspecified: Secondary | ICD-10-CM | POA: Diagnosis not present

## 2023-06-04 MED ORDER — ONDANSETRON 4 MG PO TBDP: 4.0000 mg | ORAL_TABLET | Freq: Three times a day (TID) | ORAL | 0 refills | Status: DC | PRN

## 2023-06-04 NOTE — Patient Instructions (Signed)
 [name], thank you for joining Piedad Climes, PA-C for today's virtual visit.  While this provider is not your primary care provider (PCP), if your PCP is located in our provider database this encounter information will be shared with them immediately following your visit.   A [facility] MyChart account gives you access to today's visit and all your visits, tests, and labs performed at [facility] " click here if you don't have a [facility] MyChart account or go to [email or web address]  Consent: (Patient) [name] provided verbal consent for this virtual visit at the beginning of the encounter.  Current Medications:  Current Outpatient Medications:    baclofen (LIORESAL) 10 MG tablet, Take 1 tablet (10 mg total) by mouth every 8 (eight) hours as needed for muscle spasms., Disp: 30 each, Rfl: 0   fluticasone (FLONASE) 50 MCG/ACT nasal spray, Place 2 sprays into both nostrils daily., Disp: 16 g, Rfl: 0   levocetirizine (XYZAL) 5 MG tablet, Take 1 tablet (5 mg total) by mouth every evening., Disp: 30 tablet, Rfl: 2   montelukast (SINGULAIR) 10 MG tablet, Take 1 tablet (10 mg total) by mouth at bedtime., Disp: 30 tablet, Rfl: 3   naproxen (NAPROSYN) 500 MG tablet, Take 1 tablet (500 mg total) by mouth 2 (two) times daily with a meal. (Patient taking differently: Take 500 mg by mouth 2 (two) times daily as needed for moderate pain (pain score 4-6).), Disp: 60 tablet, Rfl: 1   sodium chloride flush 0.9 % SOLN injection, Flush the drain once per day with 5 mL normal saline., Disp: 150 mL, Rfl: 3   Vitamin D, Ergocalciferol, (DRISDOL) 1.25 MG (50000 UNIT) CAPS capsule, Take 1 capsule (50,000 Units total) by mouth every 7 (seven) days., Disp: 20 capsule, Rfl: 2   Medications ordered in this encounter:  No orders of the defined types were placed in this encounter.    *If you need refills on other medications prior to your next appointment, please contact your  pharmacy*  Follow-Up: Call back or seek an in-person evaluation if the symptoms worsen or if the condition fails to improve as anticipated.  [facility] [phone]  Other Instructions Food Choices to Help Relieve Diarrhea, Adult Diarrhea can make you feel weak and cause you to become dehydrated. Dehydration is a condition in which there is not enough water or other fluids in the body. It is important to choose the right foods and drinks to: Relieve diarrhea. Replace lost fluids and nutrients. Prevent dehydration. What are tips for following this plan? Relieving diarrhea Avoid foods that make your diarrhea worse. These may include: Foods and drinks that are sweetened with high-fructose corn syrup, honey, or sweeteners such as xylitol, sorbitol, and mannitol. Check food labels for these ingredients. Fried, greasy, or spicy foods. Raw fruits and vegetables. Eat foods that are rich in probiotics. These include foods such as yogurt and fermented milk products. Probiotics can help increase healthy bacteria in your stomach and intestines (gastrointestinal or GI tract). This may help digestion and stop diarrhea. If you have lactose intolerance, avoid dairy products. These may make your diarrhea worse. Take medicine to help stop diarrhea only as told by your health care provider. Replacing nutrients  Eat bland, easy-to-digest foods in small amounts as you are able, until your diarrhea starts to get better. These foods include bananas, applesauce, rice, toast, and crackers. Over time, add nutrient-rich foods as your body tolerates them or as told by your  health care provider. These include: Well-cooked protein foods, such as eggs, lean meats like fish or chicken without skin, and tofu. Peeled, seeded, and soft-cooked fruits and vegetables. Low-fat dairy products. Whole grains. Take vitamin and mineral supplements as told by your health care provider. Preventing  dehydration  Start by sipping water or a solution to prevent dehydration (oral rehydration solution, or ORS). This is a drink that helps replace fluids and minerals your body has lost. You can buy an ORS at pharmacies and retail stores. Try to drink at least 8-10 cups (2,000-2,500 mL) of fluid each day to help replace lost fluids. If your urine is pale yellow, you are getting enough fluids. You may drink other liquids in addition to water, such as fruit juice that you have added water to (diluted fruit juice) or low-calorie sports drinks, as tolerated or as told by your health care provider. Avoid drinks with caffeine, such as coffee, tea, or soft drinks. Avoid alcohol. This information is not intended to replace advice given to you by your health care provider. Make sure you discuss any questions you have with your health care provider. Document Revised: 09/12/2021 Document Reviewed: 09/12/2021 Elsevier Patient Education  2024 Elsevier Inc.   If you have been instructed to have an in-person evaluation today at a local Urgent Care facility, please use the link below. It will take you to a list of all of our available [facility], including address, phone number and hours of operation. Please do not delay care.  [facility]  If you or a family member do not have a primary care provider, use the link below to schedule a visit and establish care. When you choose a [facility] primary care physician or advanced practice provider, you gain a long-term partner in health. Find a Primary Care Provider  Learn more about [facility]'s in-office and virtual care options: [facility] - Get Care Now

## 2023-06-04 NOTE — Progress Notes (Signed)
 Virtual Visit Consent   [name], you are scheduled for a virtual visit with a [facility] provider today. Just as with appointments in the office, your consent must be obtained to participate. Your consent will be active for this visit and any virtual visit you may have with one of our providers in the next 365 days. If you have a MyChart account, a copy of this consent can be sent to you electronically.  As this is a virtual visit, video technology does not allow for your provider to perform a traditional examination. This may limit your provider's ability to fully assess your condition. If your provider identifies any concerns that need to be evaluated in person or the need to arrange testing (such as labs, EKG, etc.), we will make arrangements to do so. Although advances in technology are sophisticated, we cannot ensure that it will always work on either your end or our end. If the connection with a video visit is poor, the visit may have to be switched to a telephone visit. With either a video or telephone visit, we are not always able to ensure that we have a secure connection.  By engaging in this virtual visit, you consent to the provision of healthcare and authorize for your insurance to be billed (if applicable) for the services provided during this visit. Depending on your insurance coverage, you may receive a charge related to this service.  I need to obtain your verbal consent now. Are you willing to proceed with your visit today? [name] has provided verbal consent on 06/04/2023 for a virtual visit (video or telephone). Piedad Climes, [place]  Date: 06/04/2023 10:17 AM   Virtual Visit via Video Note   I, Piedad Climes, connected with  [name]  ([id number], [birth year]) on 06/04/23 at 10:15 AM EST by a video-enabled telemedicine application and verified that I am speaking with the correct person using two identifiers.  Location: Patient: Virtual Visit  Location Patient: Home Provider: Virtual Visit Location Provider: Home Office   I discussed the limitations of evaluation and management by telemedicine and the availability of in person appointments. The patient expressed understanding and agreed to proceed.    History of Present Illness: [name] is a 38 y.o. who identifies as a female who was assigned female at birth, and is being seen today for GI symptoms starting this AM including mild abdominal cramping and nausea. Denies vomiting, fevers, chills.  Denies recent travel or sick contact. Denies abnormal food or water. Notes cramping is upper and lower and bilateral.  HPI: HPI  Problems:  Patient Active Problem List   Diagnosis Date Noted   TOA (tubo-ovarian abscess) 04/16/2023   Allergic rhinitis 03/06/2023   Vitamin D deficiency 01/16/2023   Sore throat (viral) 01/16/2023   Pap smear for cervical cancer screening 03/21/2022   Lumbar pain with radiation down left leg 02/14/2022   S/P cesarean section 05/19/2012    Allergies:  Allergies  Allergen Reactions   Bee Venom Hives, Shortness Of Breath, Swelling and Rash   Peanut (Diagnostic) Hives, Itching and Rash   Mangifera Indica Fruit Ext (Mango) [Mangifera Indica] Hives, Itching and Rash   Penicillins Rash   Medications:  Current Outpatient Medications:    baclofen (LIORESAL) 10 MG tablet, Take 1 tablet (10 mg total) by mouth every 8 (eight) hours as needed for muscle spasms., Disp: 30 each, Rfl: 0   fluticasone (FLONASE) 50 MCG/ACT nasal spray, Place 2 sprays into  both nostrils daily., Disp: 16 g, Rfl: 0   levocetirizine (XYZAL) 5 MG tablet, Take 1 tablet (5 mg total) by mouth every evening., Disp: 30 tablet, Rfl: 2   montelukast (SINGULAIR) 10 MG tablet, Take 1 tablet (10 mg total) by mouth at bedtime., Disp: 30 tablet, Rfl: 3   naproxen (NAPROSYN) 500 MG tablet, Take 1 tablet (500 mg total) by mouth 2 (two) times daily with a meal. (Patient taking differently: Take 500  mg by mouth 2 (two) times daily as needed for moderate pain (pain score 4-6).), Disp: 60 tablet, Rfl: 1   sodium chloride flush 0.9 % SOLN injection, Flush the drain once per day with 5 mL normal saline., Disp: 150 mL, Rfl: 3   Vitamin D, Ergocalciferol, (DRISDOL) 1.25 MG (50000 UNIT) CAPS capsule, Take 1 capsule (50,000 Units total) by mouth every 7 (seven) days., Disp: 20 capsule, Rfl: 2  Observations/Objective: Patient is well-developed, well-nourished in no acute distress.  Resting comfortably at home.  Head is normocephalic, atraumatic.  No labored breathing. Speech is clear and coherent with logical content.  Patient is alert and oriented at baseline.   Assessment and Plan: 1. Viral gastroenteritis (Primary)  Supportive measures and OTC medications reviewed. Zofran per orders for nausea. BRAT diet reviewed. Handout given. Work note provided.   Discussed do not feel this is related to pelvic issues from a month or so ago as this is resolved, but any increasing or focal pain, she needs an in-person evaluation ASAP.  Follow Up Instructions: I discussed the assessment and treatment plan with the patient. The patient was provided an opportunity to ask questions and all were answered. The patient agreed with the plan and demonstrated an understanding of the instructions.  A copy of instructions were sent to the patient via MyChart unless otherwise noted below.   The patient was advised to call back or seek an in-person evaluation if the symptoms worsen or if the condition fails to improve as anticipated.    Piedad Climes, PA-C

## 2023-06-06 DIAGNOSIS — E7849 Other hyperlipidemia: Secondary | ICD-10-CM | POA: Diagnosis not present

## 2023-06-06 DIAGNOSIS — R7301 Impaired fasting glucose: Secondary | ICD-10-CM | POA: Diagnosis not present

## 2023-06-06 DIAGNOSIS — E559 Vitamin D deficiency, unspecified: Secondary | ICD-10-CM | POA: Diagnosis not present

## 2023-06-06 DIAGNOSIS — E038 Other specified hypothyroidism: Secondary | ICD-10-CM | POA: Diagnosis not present

## 2023-06-07 LAB — CBC WITH DIFFERENTIAL/PLATELET
Basophils Absolute: 0 10*3/uL (ref 0.0–0.2)
Basos: 1 %
EOS (ABSOLUTE): 0.3 10*3/uL (ref 0.0–0.4)
Eos: 5 %
Hematocrit: 41.6 % (ref 34.0–46.6)
Hemoglobin: 14.4 g/dL (ref 11.1–15.9)
Immature Grans (Abs): 0 10*3/uL (ref 0.0–0.1)
Immature Granulocytes: 0 %
Lymphocytes Absolute: 2.4 10*3/uL (ref 0.7–3.1)
Lymphs: 46 %
MCH: 31.9 pg (ref 26.6–33.0)
MCHC: 34.6 g/dL (ref 31.5–35.7)
MCV: 92 fL (ref 79–97)
Monocytes Absolute: 0.4 10*3/uL (ref 0.1–0.9)
Monocytes: 8 %
Neutrophils Absolute: 2.1 10*3/uL (ref 1.4–7.0)
Neutrophils: 40 %
Platelets: 312 10*3/uL (ref 150–450)
RBC: 4.51 x10E6/uL (ref 3.77–5.28)
RDW: 13.2 % (ref 11.7–15.4)
WBC: 5.2 10*3/uL (ref 3.4–10.8)

## 2023-06-07 LAB — CMP14+EGFR
ALT: 24 [IU]/L (ref 0–32)
AST: 19 [IU]/L (ref 0–40)
Albumin: 4.3 g/dL (ref 3.9–4.9)
Alkaline Phosphatase: 68 [IU]/L (ref 44–121)
BUN/Creatinine Ratio: 8 — ABNORMAL LOW (ref 9–23)
BUN: 7 mg/dL (ref 6–20)
Bilirubin Total: 0.5 mg/dL (ref 0.0–1.2)
CO2: 25 mmol/L (ref 20–29)
Calcium: 9.5 mg/dL (ref 8.7–10.2)
Chloride: 103 mmol/L (ref 96–106)
Creatinine, Ser: 0.84 mg/dL (ref 0.57–1.00)
Globulin, Total: 2.4 g/dL (ref 1.5–4.5)
Glucose: 92 mg/dL (ref 70–99)
Potassium: 4.2 mmol/L (ref 3.5–5.2)
Sodium: 141 mmol/L (ref 134–144)
Total Protein: 6.7 g/dL (ref 6.0–8.5)
eGFR: 91 mL/min/{1.73_m2} (ref >59)

## 2023-06-07 LAB — TSH+FREE T4
Free T4: 1.13 ng/dL (ref 0.82–1.77)
TSH: 1.43 u[IU]/mL (ref 0.450–4.500)

## 2023-06-07 LAB — LIPID PANEL
Chol/HDL Ratio: 4.8 {ratio} — ABNORMAL HIGH (ref 0.0–4.4)
Cholesterol, Total: 188 mg/dL (ref 100–199)
HDL: 39 mg/dL — ABNORMAL LOW (ref >39)
LDL Chol Calc (NIH): 129 mg/dL — ABNORMAL HIGH (ref 0–99)
Triglycerides: 112 mg/dL (ref 0–149)
VLDL Cholesterol Cal: 20 mg/dL (ref 5–40)

## 2023-06-07 LAB — HEMOGLOBIN A1C
Est. average glucose Bld gHb Est-mCnc: 111 mg/dL
Hgb A1c MFr Bld: 5.5 % (ref 4.8–5.6)

## 2023-06-07 LAB — VITAMIN D 25 HYDROXY (VIT D DEFICIENCY, FRACTURES): Vit D, 25-Hydroxy: 29.7 ng/mL — ABNORMAL LOW (ref 30.0–100.0)

## 2023-06-08 DIAGNOSIS — Z419 Encounter for procedure for purposes other than remedying health state, unspecified: Secondary | ICD-10-CM | POA: Diagnosis not present

## 2023-06-14 ENCOUNTER — Encounter: Payer: Self-pay | Admitting: Family Medicine

## 2023-06-16 ENCOUNTER — Telehealth: Admitting: Family

## 2023-06-16 DIAGNOSIS — J029 Acute pharyngitis, unspecified: Secondary | ICD-10-CM | POA: Diagnosis not present

## 2023-06-16 MED ORDER — CLINDAMYCIN HCL 300 MG PO CAPS
300.0000 mg | ORAL_CAPSULE | Freq: Three times a day (TID) | ORAL | 0 refills | Status: AC
Start: 2023-06-16 — End: 2023-06-26

## 2023-06-16 NOTE — Progress Notes (Signed)
 Virtual Visit Consent   [name], you are scheduled for a virtual visit with a [facility] provider today. Just as with appointments in the office, your consent must be obtained to participate. Your consent will be active for this visit and any virtual visit you may have with one of our providers in the next 365 days. If you have a MyChart account, a copy of this consent can be sent to you electronically.  As this is a virtual visit, video technology does not allow for your provider to perform a traditional examination. This may limit your provider's ability to fully assess your condition. If your provider identifies any concerns that need to be evaluated in person or the need to arrange testing (such as labs, EKG, etc.), we will make arrangements to do so. Although advances in technology are sophisticated, we cannot ensure that it will always work on either your end or our end. If the connection with a video visit is poor, the visit may have to be switched to a telephone visit. With either a video or telephone visit, we are not always able to ensure that we have a secure connection.  By engaging in this virtual visit, you consent to the provision of healthcare and authorize for your insurance to be billed (if applicable) for the services provided during this visit. Depending on your insurance coverage, you may receive a charge related to this service.  I need to obtain your verbal consent now. Are you willing to proceed with your visit today? [name] has provided verbal consent on 06/16/2023 for a virtual visit (video or telephone). [name], FNP  Date: 06/16/2023 6:21 PM   Virtual Visit via Video Note   I, [name], connected with  [name]  ([id number], 1986-03-22) on 06/16/23 at  6:15 PM EDT by a video-enabled telemedicine application and verified that I am speaking with the correct person using two identifiers.  Location: Patient: Virtual Visit Location Patient:  Home Provider: Virtual Visit Location Provider: Home Office   I discussed the limitations of evaluation and management by telemedicine and the availability of in person appointments. The patient expressed understanding and agreed to proceed.    History of Present Illness: [name] is a 38 y.o. who identifies as a female who was assigned female at birth, and is being seen today for sore throat that started yesterday.  HPI: Sore Throat  This is a new problem. The current episode started yesterday. The problem has been gradually worsening. Maximum temperature: 99.2 F. The pain is at a severity of 7/10. The pain is moderate. Associated symptoms include swollen glands and trouble swallowing. Pertinent negatives include no congestion, coughing, drooling, ear pain or headaches. Associated symptoms comments: Rhinnoheara. She has had no exposure to strep. She has tried acetaminophen for the symptoms. The treatment provided mild relief.    Problems:  Patient Active Problem List   Diagnosis Date Noted   TOA (tubo-ovarian abscess) 04/16/2023   Allergic rhinitis 03/06/2023   Vitamin D deficiency 01/16/2023   Sore throat (viral) 01/16/2023   Pap smear for cervical cancer screening 03/21/2022   Lumbar pain with radiation down left leg 02/14/2022   S/P cesarean section 05/19/2012    Allergies:  Allergies  Allergen Reactions   Bee Venom Hives, Shortness Of Breath, Swelling and Rash   Peanut (Diagnostic) Hives, Itching and Rash   Mangifera Indica Fruit Ext (Mango) [Mangifera Indica] Hives, Itching and Rash   Penicillins Rash  Medications:  Current Outpatient Medications:    clindamycin (CLEOCIN) 300 MG capsule, Take 1 capsule (300 mg total) by mouth 3 (three) times daily for 10 days., Disp: 30 capsule, Rfl: 0   baclofen (LIORESAL) 10 MG tablet, Take 1 tablet (10 mg total) by mouth every 8 (eight) hours as needed for muscle spasms., Disp: 30 each, Rfl: 0   fluticasone (FLONASE) 50 MCG/ACT  nasal spray, Place 2 sprays into both nostrils daily., Disp: 16 g, Rfl: 0   levocetirizine (XYZAL) 5 MG tablet, Take 1 tablet (5 mg total) by mouth every evening., Disp: 30 tablet, Rfl: 2   montelukast (SINGULAIR) 10 MG tablet, Take 1 tablet (10 mg total) by mouth at bedtime., Disp: 30 tablet, Rfl: 3   naproxen (NAPROSYN) 500 MG tablet, Take 1 tablet (500 mg total) by mouth 2 (two) times daily with a meal. (Patient taking differently: Take 500 mg by mouth 2 (two) times daily as needed for moderate pain (pain score 4-6).), Disp: 60 tablet, Rfl: 1   ondansetron (ZOFRAN-ODT) 4 MG disintegrating tablet, Take 1 tablet (4 mg total) by mouth every 8 (eight) hours as needed for nausea or vomiting., Disp: 20 tablet, Rfl: 0   sodium chloride flush 0.9 % SOLN injection, Flush the drain once per day with 5 mL normal saline., Disp: 150 mL, Rfl: 3   Vitamin D, Ergocalciferol, (DRISDOL) 1.25 MG (50000 UNIT) CAPS capsule, Take 1 capsule (50,000 Units total) by mouth every 7 (seven) days., Disp: 20 capsule, Rfl: 2  Observations/Objective: Patient is well-developed, well-nourished in no acute distress.  Resting comfortably  at home.  Head is normocephalic, atraumatic.  No labored breathing.  Speech is clear and coherent with logical content.  Patient is alert and oriented at baseline.  Tonsils enlarged, erythemas, and white patches bilateral   Assessment and Plan: 1. Acute pharyngitis, unspecified etiology (Primary) - clindamycin (CLEOCIN) 300 MG capsule; Take 1 capsule (300 mg total) by mouth 3 (three) times daily for 10 days.  Dispense: 30 capsule; Refill: 0  - Take meds as prescribed - Use a cool mist humidifier  -Use saline nose sprays frequently -Force fluids -For any cough or congestion  Use plain Mucinex- regular strength or max strength is fine -For fever or aces or pains- take tylenol or ibuprofen. -Throat lozenges if help -New toothbrush in 3 days Follow up if symptoms worsen or not improve    Follow Up Instructions: I discussed the assessment and treatment plan with the patient. The patient was provided an opportunity to ask questions and all were answered. The patient agreed with the plan and demonstrated an understanding of the instructions.  A copy of instructions were sent to the patient via MyChart unless otherwise noted below.     The patient was advised to call back or seek an in-person evaluation if the symptoms worsen or if the condition fails to improve as anticipated.    [name], FNP

## 2023-06-16 NOTE — Patient Instructions (Signed)

## 2023-06-19 ENCOUNTER — Ambulatory Visit: Payer: Medicaid Other | Admitting: Family Medicine

## 2023-07-08 ENCOUNTER — Ambulatory Visit (INDEPENDENT_AMBULATORY_CARE_PROVIDER_SITE_OTHER): Payer: Medicaid Other | Admitting: Family Medicine

## 2023-07-08 VITALS — BP 113/74 | HR 66 | Ht 64.0 in | Wt 208.0 lb

## 2023-07-08 DIAGNOSIS — E038 Other specified hypothyroidism: Secondary | ICD-10-CM | POA: Diagnosis not present

## 2023-07-08 DIAGNOSIS — R7301 Impaired fasting glucose: Secondary | ICD-10-CM

## 2023-07-08 DIAGNOSIS — E559 Vitamin D deficiency, unspecified: Secondary | ICD-10-CM | POA: Diagnosis not present

## 2023-07-08 DIAGNOSIS — E6609 Other obesity due to excess calories: Secondary | ICD-10-CM | POA: Diagnosis not present

## 2023-07-08 DIAGNOSIS — E7849 Other hyperlipidemia: Secondary | ICD-10-CM

## 2023-07-08 DIAGNOSIS — E669 Obesity, unspecified: Secondary | ICD-10-CM | POA: Insufficient documentation

## 2023-07-08 DIAGNOSIS — E66811 Obesity, class 1: Secondary | ICD-10-CM | POA: Insufficient documentation

## 2023-07-08 NOTE — Assessment & Plan Note (Signed)
 Encouraged to increase his intake of vitamin D-rich foods such as fatty fish (e.g., salmon, mackerel, and sardines), fortified dairy products, egg yolks, and fortified cereals.

## 2023-07-08 NOTE — Progress Notes (Signed)
 Established Patient Office Visit  Subjective:  Patient ID: [name], female    DOB: 1986/03/06  Age: 38 y.o. MRN: [id number]  CC:  Chief Complaint  Patient presents with   Medical Management of Chronic Issues    5 month chronic F/u     HPI [name] is a 38 y.o. female with past medical history of vit d deficiency presents for f/u of  chronic medical conditions. For the details of today's visit, please refer to the assessment and plan.     Past Medical History:  Diagnosis Date   No pertinent past medical history    Sciatica     Past Surgical History:  Procedure Laterality Date   CESAREAN SECTION N/A 05/19/2012   Procedure: Primary Cesarean Section;  Surgeon: Lavina Hamman, MD;  Location: [facility];  Service: Obstetrics;  Laterality: N/A;  Primary Cesarean Section   FOOT SURGERY  as child   arch corrections    Family History  Problem Relation Age of Onset   Diabetes Mellitus II Mother    Diabetes Father    Diabetes Sister    Diabetes Maternal Grandmother    Diabetes Maternal Grandfather    Diabetes Paternal Grandmother     Social History   Socioeconomic History   Marital status: Significant Other    Spouse name: Not on file   Number of children: Not on file   Years of education: Not on file   Highest education level: 12th grade  Occupational History   Not on file  Tobacco Use   Smoking status: Never   Smokeless tobacco: Never  Vaping Use   Vaping status: Never Used  Substance and Sexual Activity   Alcohol use: No   Drug use: Not Currently    Types: Marijuana   Sexual activity: Yes  Other Topics Concern   Not on file  Social History Narrative   Not on file   Social Drivers of Health   Financial Resource Strain: Low Risk  (07/08/2023)   Overall Financial Resource Strain (CARDIA)    Difficulty of Paying Living Expenses: Not hard at all  Food Insecurity: No Food Insecurity (07/08/2023)   Hunger Vital Sign    Worried About Running Out of  Food in the Last Year: Never true    Ran Out of Food in the Last Year: Never true  Transportation Needs: No Transportation Needs (07/08/2023)   PRAPARE - Administrator, Civil Service (Medical): No    Lack of Transportation (Non-Medical): No  Physical Activity: Insufficiently Active (07/08/2023)   Exercise Vital Sign    Days of Exercise per Week: 1 day    Minutes of Exercise per Session: 30 min  Stress: No Stress Concern Present (07/08/2023)   [employer] of Occupational Health - Occupational Stress Questionnaire    Feeling of Stress : Only a little  Social Connections: Moderately Integrated (07/08/2023)   Social Connection and Isolation Panel [NHANES]    Frequency of Communication with Friends and Family: More than three times a week    Frequency of Social Gatherings with Friends and Family: Twice a week    Attends Religious Services: 1 to 4 times per year    Active Member of [employer] or Organizations: No    Attends Banker Meetings: Not on file    Marital Status: Living with partner  Intimate Partner Violence: Not At Risk (04/16/2023)   Humiliation, Afraid, Rape, and Kick questionnaire    Fear of Current or  Ex-Partner: No    Emotionally Abused: No    Physically Abused: No    Sexually Abused: No    Outpatient Medications Prior to Visit  Medication Sig Dispense Refill   baclofen (LIORESAL) 10 MG tablet Take 1 tablet (10 mg total) by mouth every 8 (eight) hours as needed for muscle spasms. 30 each 0   fluticasone (FLONASE) 50 MCG/ACT nasal spray Place 2 sprays into both nostrils daily. 16 g 0   levocetirizine (XYZAL) 5 MG tablet Take 1 tablet (5 mg total) by mouth every evening. 30 tablet 2   montelukast (SINGULAIR) 10 MG tablet Take 1 tablet (10 mg total) by mouth at bedtime. 30 tablet 3   naproxen (NAPROSYN) 500 MG tablet Take 1 tablet (500 mg total) by mouth 2 (two) times daily with a meal. (Patient taking differently: Take 500 mg by mouth 2 (two) times daily  as needed for moderate pain (pain score 4-6).) 60 tablet 1   ondansetron (ZOFRAN-ODT) 4 MG disintegrating tablet Take 1 tablet (4 mg total) by mouth every 8 (eight) hours as needed for nausea or vomiting. 20 tablet 0   sodium chloride flush 0.9 % SOLN injection Flush the drain once per day with 5 mL normal saline. 150 mL 3   Vitamin D, Ergocalciferol, (DRISDOL) 1.25 MG (50000 UNIT) CAPS capsule Take 1 capsule (50,000 Units total) by mouth every 7 (seven) days. 20 capsule 2   No facility-administered medications prior to visit.    Allergies  Allergen Reactions   Bee Venom Hives, Shortness Of Breath, Swelling and Rash   Peanut (Diagnostic) Hives, Itching and Rash   Mangifera Indica Fruit Ext (Mango) [Mangifera Indica] Hives, Itching and Rash   Penicillins Rash and Hives    ROS Review of Systems  Constitutional:  Negative for chills and fever.  Eyes:  Negative for visual disturbance.  Respiratory:  Negative for chest tightness and shortness of breath.   Neurological:  Negative for dizziness and headaches.      Objective:    Physical Exam HENT:     Head: Normocephalic.     Mouth/Throat:     Mouth: Mucous membranes are moist.  Cardiovascular:     Rate and Rhythm: Normal rate.     Heart sounds: Normal heart sounds.  Pulmonary:     Effort: Pulmonary effort is normal.     Breath sounds: Normal breath sounds.  Neurological:     Mental Status: She is alert.     BP 113/74   Pulse 66   Ht 5\' 4"  (1.626 m)   Wt 208 lb 0.6 oz (94.4 kg)   LMP 06/20/2023 (Exact Date)   SpO2 95%   BMI 35.71 kg/m  Wt Readings from Last 3 Encounters:  07/08/23 208 lb 0.6 oz (94.4 kg)  04/30/23 199 lb (90.3 kg)  04/16/23 200 lb (90.7 kg)    Lab Results  Component Value Date   TSH 1.430 06/06/2023   Lab Results  Component Value Date   WBC 5.2 06/06/2023   HGB 14.4 06/06/2023   HCT 41.6 06/06/2023   MCV 92 06/06/2023   PLT 312 06/06/2023   Lab Results  Component Value Date   NA 141  06/06/2023   K 4.2 06/06/2023   CO2 25 06/06/2023   GLUCOSE 92 06/06/2023   BUN 7 06/06/2023   CREATININE 0.84 06/06/2023   BILITOT 0.5 06/06/2023   ALKPHOS 68 06/06/2023   AST 19 06/06/2023   ALT 24 06/06/2023   PROT 6.7 06/06/2023  ALBUMIN 4.3 06/06/2023   CALCIUM 9.5 06/06/2023   ANIONGAP 4 (L) 04/19/2023   EGFR 91 06/06/2023   Lab Results  Component Value Date   CHOL 188 06/06/2023   Lab Results  Component Value Date   HDL 39 (L) 06/06/2023   Lab Results  Component Value Date   LDLCALC 129 (H) 06/06/2023   Lab Results  Component Value Date   TRIG 112 06/06/2023   Lab Results  Component Value Date   CHOLHDL 4.8 (H) 06/06/2023   Lab Results  Component Value Date   HGBA1C 5.5 06/06/2023      Assessment & Plan:  Vitamin D deficiency Assessment & Plan: Encouraged to increase his intake of vitamin D-rich foods such as fatty fish (e.g., salmon, mackerel, and sardines), fortified dairy products, egg yolks, and fortified cereals.   Orders: -     VITAMIN D 25 Hydroxy (Vit-D Deficiency, Fractures)  Obesity due to excess calories without serious comorbidity, unspecified class Assessment & Plan: Emphasize Lifestyle Changes: A heart-healthy diet and increased physical activity are crucial. Healthy Tips for Weight Loss: Increase Intake of Nutrient-Rich Foods: Prioritize fruits, vegetables, and whole grains. Incorporate Lean Proteins: Include chicken, fish, beans, and legumes in your diet. Choose Low-Fat Dairy Products: Opt for dairy products that are low in fat. Reduce Unhealthy Fats: Limit saturated fats, trans fatty acids, and cholesterol. Aim for Regular Physical Activity: Engage in at least 30 minutes of brisk walking or other physical activities on at least 5 days a week.     IFG (impaired fasting glucose) -     Hemoglobin A1c  TSH (thyroid-stimulating hormone deficiency) -     TSH + free T4  Other hyperlipidemia -     Lipid panel -     CMP14+EGFR -      CBC with Differential/Platelet  Note: This chart has been completed using Engineer, civil (consulting) software, and while attempts have been made to ensure accuracy, certain words and phrases may not be transcribed as intended.    Follow-up: Return in about 6 months (around 01/07/2024).   Gilmore Laroche, FNP

## 2023-07-08 NOTE — Patient Instructions (Addendum)
 I appreciate the opportunity to provide care to you today!    Follow up:  6 months  Labs: please stop by the lab 2-3 days before your next appointment to get your blood drawn (CBC, CMP, TSH, Lipid profile, HgA1c, Vit D)     Please continue to a heart-healthy diet and increase your physical activities. Try to exercise for at least five days a week.    It was a pleasure to see you and I look forward to continuing to work together on your health and well-being. Please do not hesitate to call the office if you need care or have questions about your care.  In case of emergency, please visit the Emergency Department for urgent care, or contact our clinic at [phone] to schedule an appointment. We're here to help you!   Have a wonderful day and week. With Gratitude, Gilmore Laroche MSN, FNP-BC

## 2023-07-08 NOTE — Assessment & Plan Note (Signed)
Emphasize Lifestyle Changes: A heart-healthy diet and increased physical activity are crucial. Healthy Tips for Weight Loss: Increase Intake of Nutrient-Rich Foods: Prioritize fruits, vegetables, and whole grains. Incorporate Lean Proteins: Include chicken, fish, beans, and legumes in your diet. Choose Low-Fat Dairy Products: Opt for dairy products that are low in fat. Reduce Unhealthy Fats: Limit saturated fats, trans fatty acids, and cholesterol. Aim for Regular Physical Activity: Engage in at least 30 minutes of brisk walking or other physical activities on at least 5 days a week.

## 2023-07-20 DIAGNOSIS — Z419 Encounter for procedure for purposes other than remedying health state, unspecified: Secondary | ICD-10-CM | POA: Diagnosis not present

## 2023-07-30 ENCOUNTER — Telehealth: Admitting: Physician Assistant

## 2023-07-30 DIAGNOSIS — R109 Unspecified abdominal pain: Secondary | ICD-10-CM

## 2023-07-30 DIAGNOSIS — R110 Nausea: Secondary | ICD-10-CM

## 2023-07-30 MED ORDER — ONDANSETRON 4 MG PO TBDP: 4.0000 mg | ORAL_TABLET | Freq: Three times a day (TID) | ORAL | 0 refills | Status: AC | PRN

## 2023-07-30 NOTE — Progress Notes (Signed)
 Virtual Visit Consent   [name], you are scheduled for a virtual visit with a [facility] provider today. Just as with appointments in the office, your consent must be obtained to participate. Your consent will be active for this visit and any virtual visit you may have with one of our providers in the next 365 days. If you have a MyChart account, a copy of this consent can be sent to you electronically.  As this is a virtual visit, video technology does not allow for your provider to perform a traditional examination. This may limit your provider's ability to fully assess your condition. If your provider identifies any concerns that need to be evaluated in person or the need to arrange testing (such as labs, EKG, etc.), we will make arrangements to do so. Although advances in technology are sophisticated, we cannot ensure that it will always work on either your end or our end. If the connection with a video visit is poor, the visit may have to be switched to a telephone visit. With either a video or telephone visit, we are not always able to ensure that we have a secure connection.  By engaging in this virtual visit, you consent to the provision of healthcare and authorize for your insurance to be billed (if applicable) for the services provided during this visit. Depending on your insurance coverage, you may receive a charge related to this service.  I need to obtain your verbal consent now. Are you willing to proceed with your visit today? [name] has provided verbal consent on 07/30/2023 for a virtual visit (video or telephone). Angelia Kelp, PA-C  Date: 07/30/2023 10:03 AM   Virtual Visit via Video Note   I, Angelia Kelp, connected with  [name]  ([id number], [id number]) on 07/30/23 at  9:45 AM EDT by a video-enabled telemedicine application and verified that I am speaking with the correct person using two identifiers.  Location: Patient: Virtual Visit  Location Patient: Home Provider: Virtual Visit Location Provider: Home Office   I discussed the limitations of evaluation and management by telemedicine and the availability of in person appointments. The patient expressed understanding and agreed to proceed.    History of Present Illness: [name] is a 38 y.o. who identifies as a female who was assigned female at birth, and is being seen today for abdominal cramping.  HPI: Emesis  This is a new problem. The current episode started today. There has been no fever. Associated symptoms include abdominal pain (cramping) and headaches. Pertinent negatives include no chills, coughing, diarrhea, fever, myalgias, sweats or URI. Associated symptoms comments: Slight nausea, back ache. She has tried acetaminophen  (naproxen ) for the symptoms. The treatment provided no relief.  Denies urinary changes, bowel changes PMH: ovarian cyst ruptured, feels  Had menstrual cycle last week on for 4 days then stopped for 2 days, then on for 2 days, then stopped   Problems:  Patient Active Problem List   Diagnosis Date Noted   Obesity 07/08/2023   TOA (tubo-ovarian abscess) 04/16/2023   Allergic rhinitis 03/06/2023   Vitamin D  deficiency 01/16/2023   Sore throat (viral) 01/16/2023   Pap smear for cervical cancer screening 03/21/2022   Lumbar pain with radiation down left leg 02/14/2022   S/P cesarean section 05/19/2012    Allergies:  Allergies  Allergen Reactions   Bee Venom Hives, Shortness Of Breath, Swelling and Rash   Peanut (Diagnostic) Hives, Itching and Rash   Mangifera Indica  Fruit Ext (Mango) [Mangifera Indica] Hives, Itching and Rash   Penicillins Rash and Hives   Medications:  Current Outpatient Medications:    ondansetron  (ZOFRAN -ODT) 4 MG disintegrating tablet, Take 1 tablet (4 mg total) by mouth every 8 (eight) hours as needed., Disp: 20 tablet, Rfl: 0   baclofen  (LIORESAL ) 10 MG tablet, Take 1 tablet (10 mg total) by mouth every 8  (eight) hours as needed for muscle spasms., Disp: 30 each, Rfl: 0   fluticasone  (FLONASE ) 50 MCG/ACT nasal spray, Place 2 sprays into both nostrils daily., Disp: 16 g, Rfl: 0   levocetirizine (XYZAL ) 5 MG tablet, Take 1 tablet (5 mg total) by mouth every evening., Disp: 30 tablet, Rfl: 2   montelukast  (SINGULAIR ) 10 MG tablet, Take 1 tablet (10 mg total) by mouth at bedtime., Disp: 30 tablet, Rfl: 3   naproxen  (NAPROSYN ) 500 MG tablet, Take 1 tablet (500 mg total) by mouth 2 (two) times daily with a meal. (Patient taking differently: Take 500 mg by mouth 2 (two) times daily as needed for moderate pain (pain score 4-6).), Disp: 60 tablet, Rfl: 1   Vitamin D , Ergocalciferol , (DRISDOL ) 1.25 MG (50000 UNIT) CAPS capsule, Take 1 capsule (50,000 Units total) by mouth every 7 (seven) days., Disp: 20 capsule, Rfl: 2  Observations/Objective: Patient is well-developed, well-nourished in no acute distress.  Resting comfortably at home.  Head is normocephalic, atraumatic.  No labored breathing.  Speech is clear and coherent with logical content.  Patient is alert and oriented at baseline.    Assessment and Plan: 1. Abdominal cramping (Primary)  2. Nausea - ondansetron  (ZOFRAN -ODT) 4 MG disintegrating tablet; Take 1 tablet (4 mg total) by mouth every 8 (eight) hours as needed.  Dispense: 20 tablet; Refill: 0  - Symptomatic management of abdominal cramping with Naproxen  and Tylenol  - Add muscle relaxer she has on hand at home (Baclofen ) if needed - Heating pad - Epsom salt soaks - Magnesium  Glycinate 200mg  at bedtime for cramping - Zofran  added for nausea - Work note provided - Seek in person evaluation if worsening or fails to improve  Follow Up Instructions: I discussed the assessment and treatment plan with the patient. The patient was provided an opportunity to ask questions and all were answered. The patient agreed with the plan and demonstrated an understanding of the instructions.  A copy of  instructions were sent to the patient via MyChart unless otherwise noted below.    The patient was advised to call back or seek an in-person evaluation if the symptoms worsen or if the condition fails to improve as anticipated.    Angelia Kelp, PA-C

## 2023-07-30 NOTE — Patient Instructions (Signed)
 [name], thank you for joining Angelia Kelp, PA-C for today's virtual visit.  While this provider is not your primary care provider (PCP), if your PCP is located in our provider database this encounter information will be shared with them immediately following your visit.   A [facility] MyChart account gives you access to today's visit and all your visits, tests, and labs performed at [facility] " click here if you don't have a [facility] MyChart account or go to [email or web address]  Consent: (Patient) [name] provided verbal consent for this virtual visit at the beginning of the encounter.  Current Medications:  Current Outpatient Medications:    ondansetron  (ZOFRAN -ODT) 4 MG disintegrating tablet, Take 1 tablet (4 mg total) by mouth every 8 (eight) hours as needed., Disp: 20 tablet, Rfl: 0   baclofen  (LIORESAL ) 10 MG tablet, Take 1 tablet (10 mg total) by mouth every 8 (eight) hours as needed for muscle spasms., Disp: 30 each, Rfl: 0   fluticasone  (FLONASE ) 50 MCG/ACT nasal spray, Place 2 sprays into both nostrils daily., Disp: 16 g, Rfl: 0   levocetirizine (XYZAL ) 5 MG tablet, Take 1 tablet (5 mg total) by mouth every evening., Disp: 30 tablet, Rfl: 2   montelukast  (SINGULAIR ) 10 MG tablet, Take 1 tablet (10 mg total) by mouth at bedtime., Disp: 30 tablet, Rfl: 3   naproxen  (NAPROSYN ) 500 MG tablet, Take 1 tablet (500 mg total) by mouth 2 (two) times daily with a meal. (Patient taking differently: Take 500 mg by mouth 2 (two) times daily as needed for moderate pain (pain score 4-6).), Disp: 60 tablet, Rfl: 1   Vitamin D , Ergocalciferol , (DRISDOL ) 1.25 MG (50000 UNIT) CAPS capsule, Take 1 capsule (50,000 Units total) by mouth every 7 (seven) days., Disp: 20 capsule, Rfl: 2   Medications ordered in this encounter:  Meds ordered this encounter  Medications   ondansetron  (ZOFRAN -ODT) 4 MG disintegrating tablet    Sig: Take 1 tablet (4 mg total) by  mouth every 8 (eight) hours as needed.    Dispense:  20 tablet    Refill:  0    Supervising Provider:   LAMPTEY, PHILIP O [[id number]]     *If you need refills on other medications prior to your next appointment, please contact your pharmacy*  Follow-Up: Call back or seek an in-person evaluation if the symptoms worsen or if the condition fails to improve as anticipated.  [facility] [phone]  Other Instructions  - Symptomatic management of abdominal cramping with Naproxen  and Tylenol  - Add muscle relaxer she has on hand at home (Baclofen ) if needed - Heating pad - Epsom salt soaks - Magnesium  Glycinate 200mg  at bedtime for cramping - Zofran  added for nausea - Work note provided - Seek in person evaluation if worsening or fails to improve   If you have been instructed to have an in-person evaluation today at a local Urgent Care facility, please use the link below. It will take you to a list of all of our available [facility], including address, phone number and hours of operation. Please do not delay care.  [facility]  If you or a family member do not have a primary care provider, use the link below to schedule a visit and establish care. When you choose a [facility] primary care physician or advanced practice provider, you gain a long-term partner in health. Find a Primary Care Provider  Learn more about 's  in-office and virtual care options: [facility] - Get Care Now

## 2023-08-08 ENCOUNTER — Encounter: Payer: Self-pay | Admitting: Obstetrics & Gynecology

## 2023-08-08 ENCOUNTER — Ambulatory Visit: Admitting: Obstetrics & Gynecology

## 2023-08-08 VITALS — BP 113/74 | HR 88 | Ht 64.0 in

## 2023-08-08 DIAGNOSIS — R1010 Upper abdominal pain, unspecified: Secondary | ICD-10-CM | POA: Diagnosis not present

## 2023-08-08 DIAGNOSIS — N946 Dysmenorrhea, unspecified: Secondary | ICD-10-CM | POA: Diagnosis not present

## 2023-08-08 DIAGNOSIS — Z8742 Personal history of other diseases of the female genital tract: Secondary | ICD-10-CM | POA: Diagnosis not present

## 2023-08-08 MED ORDER — PANTOPRAZOLE SODIUM 20 MG PO TBEC: 20.0000 mg | DELAYED_RELEASE_TABLET | Freq: Every day | ORAL | 2 refills | Status: DC

## 2023-08-08 MED ORDER — DESOGESTREL-ETHINYL ESTRADIOL 0.15-30 MG-MCG PO TABS: 1.0000 | ORAL_TABLET | Freq: Every day | ORAL | 11 refills | Status: AC

## 2023-08-08 NOTE — Progress Notes (Signed)
 Chief Complaint  Patient presents with   Follow-up    From January, cramps are worse.       38 y.o. [id number] Patient's last menstrual period was 06/20/2023. The current method of family planning is none.  Outpatient Encounter Medications as of 08/08/2023  Medication Sig   baclofen  (LIORESAL ) 10 MG tablet Take 1 tablet (10 mg total) by mouth every 8 (eight) hours as needed for muscle spasms.   desogestrel -ethinyl estradiol  (APRI ) 0.15-30 MG-MCG tablet Take 1 tablet by mouth daily.   fluticasone  (FLONASE ) 50 MCG/ACT nasal spray Place 2 sprays into both nostrils daily.   levocetirizine (XYZAL ) 5 MG tablet Take 1 tablet (5 mg total) by mouth every evening.   montelukast  (SINGULAIR ) 10 MG tablet Take 1 tablet (10 mg total) by mouth at bedtime.   naproxen  (NAPROSYN ) 500 MG tablet Take 1 tablet (500 mg total) by mouth 2 (two) times daily with a meal.   pantoprazole  (PROTONIX ) 20 MG tablet Take 1 tablet (20 mg total) by mouth daily.   Vitamin D , Ergocalciferol , (DRISDOL ) 1.25 MG (50000 UNIT) CAPS capsule Take 1 capsule (50,000 Units total) by mouth every 7 (seven) days.   ondansetron  (ZOFRAN -ODT) 4 MG disintegrating tablet Take 1 tablet (4 mg total) by mouth every 8 (eight) hours as needed. (Patient not taking: Reported on 08/08/2023)   No facility-administered encounter medications on file as of 08/08/2023.    Subjective Pt with ^epigastric pain, occurs every other day or so Nausea and diarrhea associated with her menstrual cycle Cramps much worse Bleeding volume is variable but can be heavy Rare pain with intercourse No GERD symtpoms   Past Medical History:  Diagnosis Date   No pertinent past medical history    Sciatica     Past Surgical History:  Procedure Laterality Date   CESAREAN SECTION N/A 05/19/2012   Procedure: Primary Cesarean Section;  Surgeon: Cyd Dowse, MD;  Location: [facility];  Service: Obstetrics;  Laterality: N/A;  Primary Cesarean Section   FOOT SURGERY   as child   arch corrections    OB History     Gravida  3   Para  3   Term  3   Preterm  0   AB  0   Living  2      SAB  0   IAB  0   Ectopic  0   Multiple  0   Live Births              Allergies  Allergen Reactions   Bee Venom Hives, Shortness Of Breath, Swelling and Rash   Peanut (Diagnostic) Hives, Itching and Rash   Mangifera Indica Fruit Ext (Mango) [Mangifera Indica] Hives, Itching and Rash   Penicillins Rash and Hives    Social History   Socioeconomic History   Marital status: Significant Other    Spouse name: Not on file   Number of children: Not on file   Years of education: Not on file   Highest education level: 12th grade  Occupational History   Not on file  Tobacco Use   Smoking status: Never   Smokeless tobacco: Never  Vaping Use   Vaping status: Never Used  Substance and Sexual Activity   Alcohol use: No   Drug use: Not Currently    Types: Marijuana   Sexual activity: Yes  Other Topics Concern   Not on file  Social History Narrative   Not on file   Social Drivers of  Health   Financial Resource Strain: Low Risk  (07/08/2023)   Overall Financial Resource Strain (CARDIA)    Difficulty of Paying Living Expenses: Not hard at all  Food Insecurity: No Food Insecurity (07/08/2023)   Hunger Vital Sign    Worried About Running Out of Food in the Last Year: Never true    Ran Out of Food in the Last Year: Never true  Transportation Needs: No Transportation Needs (07/08/2023)   PRAPARE - Administrator, Civil Service (Medical): No    Lack of Transportation (Non-Medical): No  Physical Activity: Insufficiently Active (07/08/2023)   Exercise Vital Sign    Days of Exercise per Week: 1 day    Minutes of Exercise per Session: 30 min  Stress: No Stress Concern Present (07/08/2023)   [employer] of Occupational Health - Occupational Stress Questionnaire    Feeling of Stress : Only a little  Social Connections: Moderately  Integrated (07/08/2023)   Social Connection and Isolation Panel [NHANES]    Frequency of Communication with Friends and Family: More than three times a week    Frequency of Social Gatherings with Friends and Family: Twice a week    Attends Religious Services: 1 to 4 times per year    Active Member of [employer] or Organizations: No    Attends Engineer, structural: Not on file    Marital Status: Living with partner    Family History  Problem Relation Age of Onset   Diabetes Mellitus II Mother    Diabetes Father    Diabetes Sister    Diabetes Maternal Grandmother    Diabetes Maternal Grandfather    Diabetes Paternal Grandmother     Medications:       Current Outpatient Medications:    baclofen  (LIORESAL ) 10 MG tablet, Take 1 tablet (10 mg total) by mouth every 8 (eight) hours as needed for muscle spasms., Disp: 30 each, Rfl: 0   desogestrel -ethinyl estradiol  (APRI ) 0.15-30 MG-MCG tablet, Take 1 tablet by mouth daily., Disp: 28 tablet, Rfl: 11   fluticasone  (FLONASE ) 50 MCG/ACT nasal spray, Place 2 sprays into both nostrils daily., Disp: 16 g, Rfl: 0   levocetirizine (XYZAL ) 5 MG tablet, Take 1 tablet (5 mg total) by mouth every evening., Disp: 30 tablet, Rfl: 2   montelukast  (SINGULAIR ) 10 MG tablet, Take 1 tablet (10 mg total) by mouth at bedtime., Disp: 30 tablet, Rfl: 3   naproxen  (NAPROSYN ) 500 MG tablet, Take 1 tablet (500 mg total) by mouth 2 (two) times daily with a meal., Disp: 60 tablet, Rfl: 1   pantoprazole  (PROTONIX ) 20 MG tablet, Take 1 tablet (20 mg total) by mouth daily., Disp: 30 tablet, Rfl: 2   Vitamin D , Ergocalciferol , (DRISDOL ) 1.25 MG (50000 UNIT) CAPS capsule, Take 1 capsule (50,000 Units total) by mouth every 7 (seven) days., Disp: 20 capsule, Rfl: 2   ondansetron  (ZOFRAN -ODT) 4 MG disintegrating tablet, Take 1 tablet (4 mg total) by mouth every 8 (eight) hours as needed. (Patient not taking: Reported on 08/08/2023), Disp: 20 tablet, Rfl: 0  Objective Blood  pressure 113/74, pulse 88, height 5\' 4"  (1.626 m), last menstrual period 06/20/2023.  Tender upper abdomen epigastric pain General W[facility]N female NAD Vulva:  normal appearing vulva with no masses, tenderness or lesions Vagina:  normal mucosa, no discharge Cervix:  Normal no lesions Uterus:  normal size, contour, position, consistency, mobility, non-tender Adnexa: ovaries:present,  normal adnexa in size, nontender and no masses    Pertinent ROS No burning  with urination, frequency or urgency No nausea, vomiting or diarrhea Nor fever chills or other constitutional symptoms   Labs or studies Reviewed recents labs and scans    Impression + Management Plan: Diagnoses this Encounter::   ICD-10-CM   1. History of tubo ovarian abscess s/p IR drain  Z87.42     2. Dysmenorrhea  N94.6     3. Pain of upper abdomen, midline, unrelated to meals  R10.10         Medications prescribed during  this encounter: Meds ordered this encounter  Medications   desogestrel -ethinyl estradiol  (APRI ) 0.15-30 MG-MCG tablet    Sig: Take 1 tablet by mouth daily.    Dispense:  28 tablet    Refill:  11   pantoprazole  (PROTONIX ) 20 MG tablet    Sig: Take 1 tablet (20 mg total) by mouth daily.    Dispense:  30 tablet    Refill:  2    Labs or Scans Ordered during this encounter: No orders of the defined types were placed in this encounter.     Follow up Return in about 3 months (around 11/08/2023) for Follow up, with Dr Randolm Butte.

## 2023-08-19 DIAGNOSIS — Z419 Encounter for procedure for purposes other than remedying health state, unspecified: Secondary | ICD-10-CM | POA: Diagnosis not present

## 2023-08-21 ENCOUNTER — Encounter: Payer: Self-pay | Admitting: Obstetrics & Gynecology

## 2023-09-01 ENCOUNTER — Other Ambulatory Visit: Payer: Self-pay | Admitting: Family Medicine

## 2023-09-01 DIAGNOSIS — R6889 Other general symptoms and signs: Secondary | ICD-10-CM

## 2023-09-01 DIAGNOSIS — J302 Other seasonal allergic rhinitis: Secondary | ICD-10-CM

## 2023-09-01 DIAGNOSIS — M545 Low back pain, unspecified: Secondary | ICD-10-CM

## 2023-09-03 ENCOUNTER — Telehealth: Admitting: Physician Assistant

## 2023-09-03 DIAGNOSIS — K047 Periapical abscess without sinus: Secondary | ICD-10-CM

## 2023-09-03 DIAGNOSIS — G8929 Other chronic pain: Secondary | ICD-10-CM | POA: Diagnosis not present

## 2023-09-03 DIAGNOSIS — M545 Low back pain, unspecified: Secondary | ICD-10-CM

## 2023-09-03 MED ORDER — CLINDAMYCIN HCL 300 MG PO CAPS: 300.0000 mg | ORAL_CAPSULE | Freq: Three times a day (TID) | ORAL | 0 refills | Status: AC

## 2023-09-03 NOTE — Progress Notes (Signed)
 Virtual Visit Consent   [name], you are scheduled for a virtual visit with a [facility] provider today. Just as with appointments in the office, your consent must be obtained to participate. Your consent will be active for this visit and any virtual visit you may have with one of our providers in the next 365 days. If you have a MyChart account, a copy of this consent can be sent to you electronically.  As this is a virtual visit, video technology does not allow for your provider to perform a traditional examination. This may limit your provider's ability to fully assess your condition. If your provider identifies any concerns that need to be evaluated in person or the need to arrange testing (such as labs, EKG, etc.), we will make arrangements to do so. Although advances in technology are sophisticated, we cannot ensure that it will always work on either your end or our end. If the connection with a video visit is poor, the visit may have to be switched to a telephone visit. With either a video or telephone visit, we are not always able to ensure that we have a secure connection.  By engaging in this virtual visit, you consent to the provision of healthcare and authorize for your insurance to be billed (if applicable) for the services provided during this visit. Depending on your insurance coverage, you may receive a charge related to this service.  I need to obtain your verbal consent now. Are you willing to proceed with your visit today? [name] has provided verbal consent on 09/03/2023 for a virtual visit (video or telephone). Hyla Maillard, [place]  Date: 09/03/2023 10:19 AM   Virtual Visit via Video Note   I, Hyla Maillard, connected with  [name]  ([id number], [birth year]) on 09/03/23 at 10:15 AM EDT by a video-enabled telemedicine application and verified that I am speaking with the correct person using two identifiers.  Location: Patient: Virtual Visit  Location Patient: Home Provider: Virtual Visit Location Provider: Home Office   I discussed the limitations of evaluation and management by telemedicine and the availability of in person appointments. The patient expressed understanding and agreed to proceed.    History of Present Illness: [name] is a 38 y.o. who identifies as a female who was assigned female at birth, and is being seen today for multiple complaints.  Endorses some acute on chronic back pain over the past few days and she has been out of her naproxen .  Notes she did send a refill request to her primary care provider and the medication has been refilled.  Will need to get pick up today.  Was unable to go to work this morning due to the pain, so requesting a work note for the day.   Also endorses 2 days of pain of left lower molar after mild tooth chip. Trying to get in with dentist.  Averages pain at 5/10. No drainage but substantial swelling.  Denies fever or chills.  Is penicillin allergic.   HPI: HPI  Problems:  Patient Active Problem List   Diagnosis Date Noted   Obesity 07/08/2023   TOA (tubo-ovarian abscess) 04/16/2023   Allergic rhinitis 03/06/2023   Vitamin D  deficiency 01/16/2023   Sore throat (viral) 01/16/2023   Pap smear for cervical cancer screening 03/21/2022   Lumbar pain with radiation down left leg 02/14/2022   S/P cesarean section 05/19/2012    Allergies:  Allergies  Allergen Reactions   Bee  Venom Hives, Shortness Of Breath, Swelling and Rash   Peanut (Diagnostic) Hives, Itching and Rash   Mangifera Indica Fruit Ext (Mango) [Mangifera Indica] Hives, Itching and Rash   Penicillins Rash and Hives   Medications:  Current Outpatient Medications:    clindamycin  (CLEOCIN ) 300 MG capsule, Take 1 capsule (300 mg total) by mouth 3 (three) times daily for 7 days., Disp: 21 capsule, Rfl: 0   baclofen  (LIORESAL ) 10 MG tablet, TAKE 1 TABLET(10 MG) BY MOUTH EVERY 8 HOURS AS NEEDED FOR MUSCLE SPASMS,  Disp: 30 tablet, Rfl: 0   desogestrel -ethinyl estradiol  (APRI ) 0.15-30 MG-MCG tablet, Take 1 tablet by mouth daily., Disp: 28 tablet, Rfl: 11   fluticasone  (FLONASE ) 50 MCG/ACT nasal spray, SHAKE LIQUID AND USE 2 SPRAYS IN EACH NOSTRIL DAILY, Disp: 16 g, Rfl: 0   levocetirizine (XYZAL ) 5 MG tablet, TAKE 1 TABLET(5 MG) BY MOUTH EVERY EVENING, Disp: 30 tablet, Rfl: 2   montelukast  (SINGULAIR ) 10 MG tablet, TAKE 1 TABLET(10 MG) BY MOUTH AT BEDTIME, Disp: 30 tablet, Rfl: 3   naproxen  (NAPROSYN ) 500 MG tablet, TAKE 1 TABLET(500 MG) BY MOUTH TWICE DAILY WITH A MEAL, Disp: 60 tablet, Rfl: 1   ondansetron  (ZOFRAN -ODT) 4 MG disintegrating tablet, Take 1 tablet (4 mg total) by mouth every 8 (eight) hours as needed. (Patient not taking: Reported on 08/08/2023), Disp: 20 tablet, Rfl: 0   pantoprazole  (PROTONIX ) 20 MG tablet, Take 1 tablet (20 mg total) by mouth daily., Disp: 30 tablet, Rfl: 2   Vitamin D , Ergocalciferol , (DRISDOL ) 1.25 MG (50000 UNIT) CAPS capsule, Take 1 capsule (50,000 Units total) by mouth every 7 (seven) days., Disp: 20 capsule, Rfl: 2  Observations/Objective: Patient is well-developed, well-nourished in no acute distress.  Resting comfortably at home.  Head is normocephalic, atraumatic.  No labored breathing.  Speech is clear and coherent with logical content.  Patient is alert and oriented at baseline.   Assessment and Plan: 1. Chronic bilateral low back pain without sciatica (Primary)  Medication refills were sent in by her PCP.  She is to take as directed.  Will provide a work note for the day, but she is to follow-up with PCP for this ongoing issue.  2. Dental infection - clindamycin  (CLEOCIN ) 300 MG capsule; Take 1 capsule (300 mg total) by mouth 3 (three) times daily for 7 days.  Dispense: 21 capsule; Refill: 0  Supportive measures and OTC medications reviewed.  Clindamycin  per orders.  Follow Up Instructions: I discussed the assessment and treatment plan with the patient.  The patient was provided an opportunity to ask questions and all were answered. The patient agreed with the plan and demonstrated an understanding of the instructions.  A copy of instructions were sent to the patient via MyChart unless otherwise noted below.   The patient was advised to call back or seek an in-person evaluation if the symptoms worsen or if the condition fails to improve as anticipated.    Hyla Maillard, PA-C

## 2023-09-03 NOTE — Patient Instructions (Signed)
 Adelene Homer, thank you for joining Hyla Maillard, PA-C for today's virtual visit.  While this provider is not your primary care provider (PCP), if your PCP is located in our provider database this encounter information will be shared with them immediately following your visit.   A [facility] MyChart account gives you access to today's visit and all your visits, tests, and labs performed at [facility] " click here if you don't have a [facility] MyChart account or go to [email or web address]  Consent: (Patient) Adelene Homer provided verbal consent for this virtual visit at the beginning of the encounter.  Current Medications:  Current Outpatient Medications:    clindamycin  (CLEOCIN ) 300 MG capsule, Take 1 capsule (300 mg total) by mouth 3 (three) times daily for 7 days., Disp: 21 capsule, Rfl: 0   baclofen  (LIORESAL ) 10 MG tablet, TAKE 1 TABLET(10 MG) BY MOUTH EVERY 8 HOURS AS NEEDED FOR MUSCLE SPASMS, Disp: 30 tablet, Rfl: 0   desogestrel -ethinyl estradiol  (APRI ) 0.15-30 MG-MCG tablet, Take 1 tablet by mouth daily., Disp: 28 tablet, Rfl: 11   fluticasone  (FLONASE ) 50 MCG/ACT nasal spray, SHAKE LIQUID AND USE 2 SPRAYS IN EACH NOSTRIL DAILY, Disp: 16 g, Rfl: 0   levocetirizine (XYZAL ) 5 MG tablet, TAKE 1 TABLET(5 MG) BY MOUTH EVERY EVENING, Disp: 30 tablet, Rfl: 2   montelukast  (SINGULAIR ) 10 MG tablet, TAKE 1 TABLET(10 MG) BY MOUTH AT BEDTIME, Disp: 30 tablet, Rfl: 3   naproxen  (NAPROSYN ) 500 MG tablet, TAKE 1 TABLET(500 MG) BY MOUTH TWICE DAILY WITH A MEAL, Disp: 60 tablet, Rfl: 1   ondansetron  (ZOFRAN -ODT) 4 MG disintegrating tablet, Take 1 tablet (4 mg total) by mouth every 8 (eight) hours as needed. (Patient not taking: Reported on 08/08/2023), Disp: 20 tablet, Rfl: 0   pantoprazole  (PROTONIX ) 20 MG tablet, Take 1 tablet (20 mg total) by mouth daily., Disp: 30 tablet, Rfl: 2   Vitamin D , Ergocalciferol , (DRISDOL ) 1.25 MG (50000 UNIT) CAPS capsule, Take 1 capsule  (50,000 Units total) by mouth every 7 (seven) days., Disp: 20 capsule, Rfl: 2   Medications ordered in this encounter:  Meds ordered this encounter  Medications   clindamycin  (CLEOCIN ) 300 MG capsule    Sig: Take 1 capsule (300 mg total) by mouth 3 (three) times daily for 7 days.    Dispense:  21 capsule    Refill:  0    Supervising Provider:   Corine Dice [[id number]]     *If you need refills on other medications prior to your next appointment, please contact your pharmacy*  Follow-Up: Call back or seek an in-person evaluation if the symptoms worsen or if the condition fails to improve as anticipated.  [facility] [phone]  Other Instructions Please continue your regular medications. If back pain is not coming back down, please follow-up in person with your primary care provider.  Continue good oral hygiene. The naproxen  will also help with dental pain. Take the clindamycin  as directed. Please follow-up with dental provider as discussed.   If you have been instructed to have an in-person evaluation today at a local Urgent Care facility, please use the link below. It will take you to a list of all of our available [facility], including address, phone number and hours of operation. Please do not delay care.  [facility]  If you or a family member do not have a primary care provider, use the link below to schedule a visit and  establish care. When you choose a [facility] primary care physician or advanced practice provider, you gain a long-term partner in health. Find a Primary Care Provider  Learn more about [facility]'s in-office and virtual care options: [facility] - Get Care Now

## 2023-09-19 DIAGNOSIS — Z419 Encounter for procedure for purposes other than remedying health state, unspecified: Secondary | ICD-10-CM | POA: Diagnosis not present

## 2023-10-19 DIAGNOSIS — Z419 Encounter for procedure for purposes other than remedying health state, unspecified: Secondary | ICD-10-CM | POA: Diagnosis not present

## 2023-11-19 DIAGNOSIS — Z419 Encounter for procedure for purposes other than remedying health state, unspecified: Secondary | ICD-10-CM | POA: Diagnosis not present

## 2023-12-20 DIAGNOSIS — Z419 Encounter for procedure for purposes other than remedying health state, unspecified: Secondary | ICD-10-CM | POA: Diagnosis not present

## 2024-01-06 DIAGNOSIS — E038 Other specified hypothyroidism: Secondary | ICD-10-CM | POA: Diagnosis not present

## 2024-01-06 DIAGNOSIS — E7849 Other hyperlipidemia: Secondary | ICD-10-CM | POA: Diagnosis not present

## 2024-01-06 DIAGNOSIS — E559 Vitamin D deficiency, unspecified: Secondary | ICD-10-CM | POA: Diagnosis not present

## 2024-01-06 DIAGNOSIS — R7301 Impaired fasting glucose: Secondary | ICD-10-CM | POA: Diagnosis not present

## 2024-01-07 LAB — CBC WITH DIFFERENTIAL/PLATELET
Basophils Absolute: 0.1 x10E3/uL (ref 0.0–0.2)
Basos: 1 %
EOS (ABSOLUTE): 0.3 x10E3/uL (ref 0.0–0.4)
Eos: 4 %
Hematocrit: 47.1 % — ABNORMAL HIGH (ref 34.0–46.6)
Hemoglobin: 15.7 g/dL (ref 11.1–15.9)
Immature Grans (Abs): 0 x10E3/uL (ref 0.0–0.1)
Immature Granulocytes: 0 %
Lymphocytes Absolute: 3.4 x10E3/uL — ABNORMAL HIGH (ref 0.7–3.1)
Lymphs: 56 %
MCH: 31.1 pg (ref 26.6–33.0)
MCHC: 33.3 g/dL (ref 31.5–35.7)
MCV: 93 fL (ref 79–97)
Monocytes Absolute: 0.5 x10E3/uL (ref 0.1–0.9)
Monocytes: 8 %
Neutrophils Absolute: 1.9 x10E3/uL (ref 1.4–7.0)
Neutrophils: 31 %
Platelets: 347 x10E3/uL (ref 150–450)
RBC: 5.05 x10E6/uL (ref 3.77–5.28)
RDW: 13.4 % (ref 11.7–15.4)
WBC: 6.2 x10E3/uL (ref 3.4–10.8)

## 2024-01-07 LAB — LIPID PANEL
Chol/HDL Ratio: 4.7 ratio — ABNORMAL HIGH (ref 0.0–4.4)
Cholesterol, Total: 203 mg/dL — ABNORMAL HIGH (ref 100–199)
HDL: 43 mg/dL (ref >39)
LDL Chol Calc (NIH): 136 mg/dL — ABNORMAL HIGH (ref 0–99)
Triglycerides: 132 mg/dL (ref 0–149)
VLDL Cholesterol Cal: 24 mg/dL (ref 5–40)

## 2024-01-07 LAB — TSH+FREE T4
Free T4: 1.22 ng/dL (ref 0.82–1.77)
TSH: 2.02 u[IU]/mL (ref 0.450–4.500)

## 2024-01-07 LAB — VITAMIN D 25 HYDROXY (VIT D DEFICIENCY, FRACTURES): Vit D, 25-Hydroxy: 40.1 ng/mL (ref 30.0–100.0)

## 2024-01-07 LAB — CMP14+EGFR
ALT: 25 IU/L (ref 0–32)
AST: 19 IU/L (ref 0–40)
Albumin: 4.4 g/dL (ref 3.9–4.9)
Alkaline Phosphatase: 68 IU/L (ref 41–116)
BUN/Creatinine Ratio: 10 (ref 9–23)
BUN: 8 mg/dL (ref 6–20)
Bilirubin Total: 0.4 mg/dL (ref 0.0–1.2)
CO2: 23 mmol/L (ref 20–29)
Calcium: 9.6 mg/dL (ref 8.7–10.2)
Chloride: 102 mmol/L (ref 96–106)
Creatinine, Ser: 0.78 mg/dL (ref 0.57–1.00)
Globulin, Total: 2.4 g/dL (ref 1.5–4.5)
Glucose: 90 mg/dL (ref 70–99)
Potassium: 4.3 mmol/L (ref 3.5–5.2)
Sodium: 139 mmol/L (ref 134–144)
Total Protein: 6.8 g/dL (ref 6.0–8.5)
eGFR: 99 mL/min/1.73 (ref >59)

## 2024-01-07 LAB — HEMOGLOBIN A1C
Est. average glucose Bld gHb Est-mCnc: 117 mg/dL
Hgb A1c MFr Bld: 5.7 % — ABNORMAL HIGH (ref 4.8–5.6)

## 2024-01-08 ENCOUNTER — Other Ambulatory Visit: Payer: Self-pay | Admitting: Medical Genetics

## 2024-01-10 ENCOUNTER — Ambulatory Visit: Admitting: Obstetrics & Gynecology

## 2024-01-10 ENCOUNTER — Encounter: Payer: Self-pay | Admitting: Family Medicine

## 2024-01-10 ENCOUNTER — Ambulatory Visit: Admitting: Family Medicine

## 2024-01-10 ENCOUNTER — Encounter: Payer: Self-pay | Admitting: Obstetrics & Gynecology

## 2024-01-10 VITALS — BP 117/75 | HR 84 | Resp 16 | Ht 64.0 in | Wt 193.1 lb

## 2024-01-10 VITALS — BP 116/75 | HR 89 | Ht 64.0 in | Wt 195.0 lb

## 2024-01-10 DIAGNOSIS — E66811 Obesity, class 1: Secondary | ICD-10-CM | POA: Diagnosis not present

## 2024-01-10 DIAGNOSIS — K219 Gastro-esophageal reflux disease without esophagitis: Secondary | ICD-10-CM

## 2024-01-10 DIAGNOSIS — N7093 Salpingitis and oophoritis, unspecified: Secondary | ICD-10-CM

## 2024-01-10 DIAGNOSIS — E559 Vitamin D deficiency, unspecified: Secondary | ICD-10-CM

## 2024-01-10 DIAGNOSIS — Z1211 Encounter for screening for malignant neoplasm of colon: Secondary | ICD-10-CM | POA: Diagnosis not present

## 2024-01-10 DIAGNOSIS — N946 Dysmenorrhea, unspecified: Secondary | ICD-10-CM

## 2024-01-10 MED ORDER — KETOROLAC TROMETHAMINE 10 MG PO TABS: 10.0000 mg | ORAL_TABLET | Freq: Three times a day (TID) | ORAL | 3 refills | Status: AC | PRN

## 2024-01-10 NOTE — Assessment & Plan Note (Addendum)
 Will begin screening at age 38. Currently asymptomatic.

## 2024-01-10 NOTE — Assessment & Plan Note (Addendum)
Emphasize Lifestyle Changes: A heart-healthy diet and increased physical activity are crucial. Healthy Tips for Weight Loss: Increase Intake of Nutrient-Rich Foods: Prioritize fruits, vegetables, and whole grains. Incorporate Lean Proteins: Include chicken, fish, beans, and legumes in your diet. Choose Low-Fat Dairy Products: Opt for dairy products that are low in fat. Reduce Unhealthy Fats: Limit saturated fats, trans fatty acids, and cholesterol. Aim for Regular Physical Activity: Engage in at least 30 minutes of brisk walking or other physical activities on at least 5 days a week.

## 2024-01-10 NOTE — Patient Instructions (Addendum)
 I appreciate the opportunity to provide care to you today!    Follow up:  8 months  Labs: next visit  For a Healthier YOU, I Recommend: Reducing your intake of sugar, sodium, carbohydrates, and saturated fats. Increasing your fiber intake by incorporating more whole grains, fruits, and vegetables into your meals. Setting healthy goals with a focus on lowering your consumption of carbs, sugar, and unhealthy fats. Adding variety to your diet by including a wide range of fruits and vegetables. Cutting back on soda and limiting processed foods as much as possible. Staying active: In addition to taking your weight loss medication, aim for at least 150 minutes of moderate-intensity physical activity each week for optimal results.    Please follow up if your symptoms worsen or fail to improve. .   Please continue to a heart-healthy diet and increase your physical activities. Try to exercise for at least five days a week.    It was a pleasure to see you and I look forward to continuing to work together on your health and well-being. Please do not hesitate to call the office if you need care or have questions about your care.  In case of emergency, please visit the Emergency Department for urgent care, or contact our clinic at [phone] to schedule an appointment. We're here to help you!   Have a wonderful day and week. With Gratitude, Meade JENEANE Gerlach MSN, FNP-BC, PMHNP-BC

## 2024-01-10 NOTE — Progress Notes (Signed)
 Follow up appointment  Menstrual issues  Chief Complaint  Patient presents with   Follow-up    Blood pressure 116/75, pulse 89, height 5' 4 (1.626 m), weight 195 lb (88.5 kg), last menstrual period 12/27/2023.  I am seeing [name] back this morning for follow-up for a couple of different issues #1 she had a tubo-ovarian abscess and IR drain and antibiotics back in January That appears to no longer be an issue she is not having pelvic pain fevers chills and has not been ill  #2 she was having heavy and painful periods and I started her desogestral based combination oral contraceptive which has helped quite a bit Her periods are lighter and less painful but she is still having cramps with them So I am going to send in a prescription for Toradol  to take around-the-clock when her periods start  #3 gastroesophageal reflux has improved significantly on the Protonix  which I sent in last  MEDS ordered this encounter: Meds ordered this encounter  Medications   ketorolac  (TORADOL ) 10 MG tablet    Sig: Take 1 tablet (10 mg total) by mouth every 8 (eight) hours as needed.    Dispense:  15 tablet    Refill:  3    Orders for this encounter: No orders of the defined types were placed in this encounter.     Impression + Management Plan   ICD-10-CM   1. Dysmenorrhea: improved on her desogestrel  COC  N94.6    Continue her COC    2. History of Tubo-ovarian abscess: S/P Abx + IR drain 04/15/22, resolved no longer an issue  N70.93     3. Gastroesophageal reflux disease: Improved and managed on Protonix   K21.9    Continue Protonix  daily and as needed      Follow Up: Return if symptoms worsen or fail to improve, for yearly 03/2025.     All questions were answered.  Past Medical History:  Diagnosis Date   No pertinent past medical history    Sciatica     Past Surgical History:  Procedure Laterality Date   CESAREAN SECTION N/A 05/19/2012   Procedure: Primary Cesarean Section;   Surgeon: Krystal Deaner, MD;  Location: [facility];  Service: Obstetrics;  Laterality: N/A;  Primary Cesarean Section   FOOT SURGERY  as child   arch corrections    OB History     Gravida  3   Para  3   Term  3   Preterm  0   AB  0   Living  2      SAB  0   IAB  0   Ectopic  0   Multiple  0   Live Births              Allergies  Allergen Reactions   Bee Venom Hives, Shortness Of Breath, Swelling and Rash   Peanut (Diagnostic) Hives, Itching and Rash   Mangifera Indica Fruit Ext (Mango) [Mangifera Indica] Hives, Itching and Rash   Penicillins Rash and Hives    Social History   Socioeconomic History   Marital status: Significant Other    Spouse name: Not on file   Number of children: Not on file   Years of education: Not on file   Highest education level: 12th grade  Occupational History   Not on file  Tobacco Use   Smoking status: Never   Smokeless tobacco: Never  Vaping Use   Vaping status: Some Days  Substance and Sexual Activity  Alcohol use: No   Drug use: Not Currently    Types: Marijuana   Sexual activity: Yes    Birth control/protection: Pill  Other Topics Concern   Not on file  Social History Narrative   Not on file   Social Drivers of Health   Financial Resource Strain: Low Risk  (01/07/2024)   Overall Financial Resource Strain (CARDIA)    Difficulty of Paying Living Expenses: Not hard at all  Food Insecurity: No Food Insecurity (01/07/2024)   Hunger Vital Sign    Worried About Running Out of Food in the Last Year: Never true    Ran Out of Food in the Last Year: Never true  Transportation Needs: No Transportation Needs (01/07/2024)   PRAPARE - Administrator, Civil Service (Medical): No    Lack of Transportation (Non-Medical): No  Physical Activity: Insufficiently Active (01/07/2024)   Exercise Vital Sign    Days of Exercise per Week: 2 days    Minutes of Exercise per Session: 20 min  Stress: No Stress Concern Present  (01/07/2024)   [employer] of Occupational Health - Occupational Stress Questionnaire    Feeling of Stress: Not at all  Social Connections: Moderately Isolated (01/07/2024)   Social Connection and Isolation Panel    Frequency of Communication with Friends and Family: Three times a week    Frequency of Social Gatherings with Friends and Family: Once a week    Attends Religious Services: Never    Database administrator or Organizations: No    Attends Engineer, structural: Not on file    Marital Status: Living with partner    Family History  Problem Relation Age of Onset   Diabetes Mellitus II Mother    Diabetes Father    Diabetes Sister    Diabetes Maternal Grandmother    Diabetes Maternal Grandfather    Diabetes Paternal Grandmother

## 2024-01-10 NOTE — Progress Notes (Signed)
 Established Patient Office Visit  Subjective:  Patient ID: [name], female    DOB: 11-May-1985  Age: 38 y.o. MRN: [id number]  CC:  Chief Complaint  Patient presents with   Medical Management of Chronic Issues    6 month follow up     HPI [name] is a 38 y.o. female with past medical history of obesity, vit d def presents for f/u of  chronic medical conditions.  For the details of today's visit, please refer to the assessment and plan.  The patient reports that her mother recently had a colonoscopy during which cancerous polyps were identified and removed. The patient was encouraged to begin colonoscopy screening at age 62.      Past Medical History:  Diagnosis Date   Allergy 38 years old   Anxiety 38 years old   Depression 38 years old   No pertinent past medical history    Sciatica     Past Surgical History:  Procedure Laterality Date   CESAREAN SECTION N/A 05/19/2012   Procedure: Primary Cesarean Section;  Surgeon: Krystal Deaner, MD;  Location: [facility];  Service: Obstetrics;  Laterality: N/A;  Primary Cesarean Section   FOOT SURGERY  as child   arch corrections    Family History  Problem Relation Age of Onset   Diabetes Mellitus II Mother    Diabetes Mother    Diabetes Father    Diabetes Sister    Diabetes Maternal Grandmother    Arthritis Maternal Grandmother    Stroke Maternal Grandmother    Diabetes Maternal Grandfather    Diabetes Paternal Grandmother    Arthritis Paternal Grandmother    Stroke Paternal Grandmother    ADD / ADHD Daughter    Asthma Daughter    Asthma Sister    Depression Sister    Diabetes Sister    Cancer Brother    Kidney disease Son    Obesity Maternal Aunt     Social History   Socioeconomic History   Marital status: Significant Other    Spouse name: Not on file   Number of children: Not on file   Years of education: Not on file   Highest education level: 12th grade  Occupational History   Not on file   Tobacco Use   Smoking status: Never   Smokeless tobacco: Never  Vaping Use   Vaping status: Some Days  Substance and Sexual Activity   Alcohol use: No   Drug use: Not Currently    Types: Marijuana   Sexual activity: Yes    Birth control/protection: Pill  Other Topics Concern   Not on file  Social History Narrative   Not on file   Social Drivers of Health   Financial Resource Strain: Low Risk  (01/07/2024)   Overall Financial Resource Strain (CARDIA)    Difficulty of Paying Living Expenses: Not hard at all  Food Insecurity: No Food Insecurity (01/07/2024)   Hunger Vital Sign    Worried About Running Out of Food in the Last Year: Never true    Ran Out of Food in the Last Year: Never true  Transportation Needs: No Transportation Needs (01/07/2024)   PRAPARE - Administrator, Civil Service (Medical): No    Lack of Transportation (Non-Medical): No  Physical Activity: Insufficiently Active (01/07/2024)   Exercise Vital Sign    Days of Exercise per Week: 2 days    Minutes of Exercise per Session: 20 min  Stress: No Stress Concern Present (  01/07/2024)   [employer] [employer] - Occupational Stress Questionnaire    Feeling of Stress: Not at all  Social Connections: Moderately Isolated (01/07/2024)   Social Connection and Isolation Panel    Frequency of Communication with Friends and Family: Three times a week    Frequency of Social Gatherings with Friends and Family: Once a week    Attends Religious Services: Never    Database administrator or Organizations: No    Attends Engineer, structural: Not on file    Marital Status: Living with partner  Intimate Partner Violence: Not At Risk (04/16/2023)   Humiliation, Afraid, Rape, and Kick questionnaire    Fear of Current or Ex-Partner: No    Emotionally Abused: No    Physically Abused: No    Sexually Abused: No    Outpatient Medications Prior to Visit  Medication Sig Dispense Refill   baclofen   (LIORESAL ) 10 MG tablet TAKE 1 TABLET(10 MG) BY MOUTH EVERY 8 HOURS AS NEEDED FOR MUSCLE SPASMS 30 tablet 0   desogestrel -ethinyl estradiol  (APRI ) 0.15-30 MG-MCG tablet Take 1 tablet by mouth daily. 28 tablet 11   fluticasone  (FLONASE ) 50 MCG/ACT nasal spray SHAKE LIQUID AND USE 2 SPRAYS IN EACH NOSTRIL DAILY 16 g 0   ketorolac  (TORADOL ) 10 MG tablet Take 1 tablet (10 mg total) by mouth every 8 (eight) hours as needed. 15 tablet 3   levocetirizine (XYZAL ) 5 MG tablet TAKE 1 TABLET(5 MG) BY MOUTH EVERY EVENING 30 tablet 2   montelukast  (SINGULAIR ) 10 MG tablet TAKE 1 TABLET(10 MG) BY MOUTH AT BEDTIME 30 tablet 3   naproxen  (NAPROSYN ) 500 MG tablet TAKE 1 TABLET(500 MG) BY MOUTH TWICE DAILY WITH A MEAL 60 tablet 1   pantoprazole  (PROTONIX ) 20 MG tablet Take 1 tablet (20 mg total) by mouth daily. 30 tablet 2   Vitamin D , Ergocalciferol , (DRISDOL ) 1.25 MG (50000 UNIT) CAPS capsule Take 1 capsule (50,000 Units total) by mouth every 7 (seven) days. 20 capsule 2   ondansetron  (ZOFRAN -ODT) 4 MG disintegrating tablet Take 1 tablet (4 mg total) by mouth every 8 (eight) hours as needed. (Patient not taking: Reported on 01/10/2024) 20 tablet 0   No facility-administered medications prior to visit.    Allergies  Allergen Reactions   Bee Venom Hives, Shortness Of Breath, Swelling and Rash   Peanut (Diagnostic) Hives, Itching and Rash   Mangifera Indica Fruit Ext (Mango) [Mangifera Indica] Hives, Itching and Rash   Penicillins Rash and Hives    ROS Review of Systems  Constitutional:  Negative for chills and fever.  Eyes:  Negative for visual disturbance.  Respiratory:  Negative for chest tightness and shortness of breath.   Neurological:  Negative for dizziness and headaches.      Objective:    Physical Exam HENT:     Head: Normocephalic.     Mouth/Throat:     Mouth: Mucous membranes are moist.  Cardiovascular:     Rate and Rhythm: Normal rate.     Heart sounds: Normal heart sounds.   Pulmonary:     Effort: Pulmonary effort is normal.     Breath sounds: Normal breath sounds.  Neurological:     Mental Status: She is alert.     BP 117/75   Pulse 84   Resp 16   Ht 5' 4 (1.626 m)   Wt 193 lb 1.9 oz (87.6 kg)   SpO2 98%   BMI 33.15 kg/m  Wt Readings from Last 3 Encounters:  01/10/24 193 lb 1.9 oz (87.6 kg)  01/10/24 195 lb (88.5 kg)  07/08/23 208 lb 0.6 oz (94.4 kg)    Lab Results  Component Value Date   TSH 2.020 01/06/2024   Lab Results  Component Value Date   WBC 6.2 01/06/2024   HGB 15.7 01/06/2024   HCT 47.1 (H) 01/06/2024   MCV 93 01/06/2024   PLT 347 01/06/2024   Lab Results  Component Value Date   NA 139 01/06/2024   K 4.3 01/06/2024   CO2 23 01/06/2024   GLUCOSE 90 01/06/2024   BUN 8 01/06/2024   CREATININE 0.78 01/06/2024   BILITOT 0.4 01/06/2024   ALKPHOS 68 01/06/2024   AST 19 01/06/2024   ALT 25 01/06/2024   PROT 6.8 01/06/2024   ALBUMIN 4.4 01/06/2024   CALCIUM 9.6 01/06/2024   ANIONGAP 4 (L) 04/19/2023   EGFR 99 01/06/2024   Lab Results  Component Value Date   CHOL 203 (H) 01/06/2024   Lab Results  Component Value Date   HDL 43 01/06/2024   Lab Results  Component Value Date   LDLCALC 136 (H) 01/06/2024   Lab Results  Component Value Date   TRIG 132 01/06/2024   Lab Results  Component Value Date   CHOLHDL 4.7 (H) 01/06/2024   Lab Results  Component Value Date   HGBA1C 5.7 (H) 01/06/2024      Assessment & Plan:  Colon cancer screening Assessment & Plan: Will begin screening at age 7. Currently asymptomatic.    Vitamin D  deficiency Assessment & Plan: Stable Continue treatment regimen as is   Obesity (BMI 30.0-34.9) Assessment & Plan: Emphasize Lifestyle Changes: A heart-healthy diet and increased physical activity are crucial. Healthy Tips for Weight Loss: Increase Intake of Nutrient-Rich Foods: Prioritize fruits, vegetables, and whole grains. Incorporate Lean Proteins: Include chicken,  fish, beans, and legumes in your diet. Choose Low-Fat Dairy Products: Opt for dairy products that are low in fat. Reduce Unhealthy Fats: Limit saturated fats, trans fatty acids, and cholesterol. Aim for Regular Physical Activity: Engage in at least 30 minutes of brisk walking or other physical activities on at least 5 days a week.      Note: This chart has been completed using Engineer, civil (consulting) software, and while attempts have been made to ensure accuracy, certain words and phrases may not be transcribed as intended.   Follow-up: Return in about 8 months (around 09/09/2024).   Jonaya Freshour  Z Bacchus, FNP

## 2024-01-10 NOTE — Assessment & Plan Note (Signed)
 Stable  Continue treatment regimen as is

## 2024-01-11 ENCOUNTER — Ambulatory Visit: Payer: Self-pay | Admitting: Family Medicine

## 2024-01-11 ENCOUNTER — Telehealth: Admitting: Nurse Practitioner

## 2024-01-11 DIAGNOSIS — B9789 Other viral agents as the cause of diseases classified elsewhere: Secondary | ICD-10-CM | POA: Diagnosis not present

## 2024-01-11 DIAGNOSIS — J019 Acute sinusitis, unspecified: Secondary | ICD-10-CM | POA: Diagnosis not present

## 2024-01-11 NOTE — Progress Notes (Signed)
 We are sorry that you are not feeling well.  Here is how we plan to help!  Based on what you have shared with me it looks like you have sinusitis.  Sinusitis is inflammation and infection in the sinus cavities of the head.  Based on your presentation I believe you most likely have Acute Viral Sinusitis.This is an infection most likely caused by a virus. There is not specific treatment for viral sinusitis other than to help you with the symptoms until the infection runs its course which is typically 7-10 days. If you are not feeling better after this time please reach out to us  for antibiotic.    You may use an oral decongestant such as Mucinex D or if you have glaucoma or high blood pressure use plain Mucinex. Saline nasal spray help and can safely be used as often as needed for congestion, I recommend you continue your nasal spray and antihistamine dailiy.   Some authorities believe that zinc sprays or the use of Echinacea may shorten the course of your symptoms.  Sinus infections are not as easily transmitted as other respiratory infection, however we still recommend that you avoid close contact with loved ones, especially the very young and elderly.  Remember to wash your hands thoroughly throughout the day as this is the number one way to prevent the spread of infection  Providers prescribe antibiotics to treat infections caused by bacteria. Antibiotics are very powerful in treating bacterial infections when they are used properly. To maintain their effectiveness, they should be used only when necessary. Overuse of antibiotics has resulted in the development of superbugs that are resistant to treatment!    After careful review of your answers, I would not recommend an antibiotic for your condition.  Antibiotics are not effective against viruses and therefore should not be used to treat them. Common examples of infections caused by viruses include colds and flu    Home Care: Only take medications  as instructed by your medical team. Do not take these medications with alcohol. A steam or ultrasonic humidifier can help congestion.  You can place a towel over your head and breathe in the steam from hot water coming from a faucet. Avoid close contacts especially the very young and the elderly. Cover your mouth when you cough or sneeze. Always remember to wash your hands.  Get Help Right Away If: You develop worsening fever or sinus pain. You develop a severe head ache or visual changes. Your symptoms persist after you have completed your treatment plan.  Make sure you Understand these instructions. Will watch your condition. Will get help right away if you are not doing well or get worse.  Your e-visit answers were reviewed by a board certified advanced clinical practitioner to complete your personal care plan.  Depending on the condition, your plan could have included both over the counter or prescription medications.  If there is a problem please reply  once you have received a response from your provider.  Your safety is important to us .  If you have drug allergies check your prescription carefully.    You can use MyChart to ask questions about today's visit, request a non-urgent call back, or ask for a work or school excuse for 24 hours related to this e-Visit. If it has been greater than 24 hours you will need to follow up with your provider, or enter a new e-Visit to address those concerns.  You will get an e-mail in the next two  days asking about your experience.  I hope that your e-visit has been valuable and will speed your recovery. Thank you for using e-visits.  I have spent 5 minutes in review of e-visit questionnaire, review and updating patient chart, medical decision making and response to patient.   [name] W Teresa Belzer, NP

## 2024-01-12 ENCOUNTER — Telehealth: Admitting: Family

## 2024-01-12 DIAGNOSIS — J069 Acute upper respiratory infection, unspecified: Secondary | ICD-10-CM | POA: Diagnosis not present

## 2024-01-12 MED ORDER — BENZONATATE 100 MG PO CAPS: 100.0000 mg | ORAL_CAPSULE | Freq: Three times a day (TID) | ORAL | 0 refills | Status: AC | PRN

## 2024-01-12 NOTE — Progress Notes (Signed)
 Virtual Visit Consent   [name], you are scheduled for a virtual visit with a [facility] provider today. Just as with appointments in the office, your consent must be obtained to participate. Your consent will be active for this visit and any virtual visit you may have with one of our providers in the next 365 days. If you have a MyChart account, a copy of this consent can be sent to you electronically.  As this is a virtual visit, video technology does not allow for your provider to perform a traditional examination. This may limit your provider's ability to fully assess your condition. If your provider identifies any concerns that need to be evaluated in person or the need to arrange testing (such as labs, EKG, etc.), we will make arrangements to do so. Although advances in technology are sophisticated, we cannot ensure that it will always work on either your end or our end. If the connection with a video visit is poor, the visit may have to be switched to a telephone visit. With either a video or telephone visit, we are not always able to ensure that we have a secure connection.  By engaging in this virtual visit, you consent to the provision of healthcare and authorize for your insurance to be billed (if applicable) for the services provided during this visit. Depending on your insurance coverage, you may receive a charge related to this service.  I need to obtain your verbal consent now. Are you willing to proceed with your visit today? MELEK POWNALL has provided verbal consent on 01/12/2024 for a virtual visit (video or telephone). Bari Learn, FNP  Date: 01/12/2024 9:13 AM   Virtual Visit via Video Note   I, Bari Learn, connected with  [name]  ([id number], May 20, 1985) on 01/12/24 at  9:15 AM EDT by a video-enabled telemedicine application and verified that I am speaking with the correct person using two identifiers.  Location: Patient: Virtual Visit Location  Patient: Home Provider: Virtual Visit Location Provider: Home Office   I discussed the limitations of evaluation and management by telemedicine and the availability of in person appointments. The patient expressed understanding and agreed to proceed.    History of Present Illness: [name] is a 38 y.o. who identifies as a female who was assigned female at birth, and is being seen today for URI symptoms that started two days ago.  HPI: URI  This is a new problem. The current episode started in the past 7 days. Associated symptoms include congestion, coughing (dry), ear pain (improved now), headaches, nausea, rhinorrhea, sinus pain, sneezing and a sore throat. Pertinent negatives include no joint pain, vomiting or wheezing. She has tried acetaminophen , sleep and increased fluids for the symptoms. The treatment provided mild relief.    Problems:  Patient Active Problem List   Diagnosis Date Noted   Colon cancer screening 01/10/2024   Obesity (BMI 30.0-34.9) 07/08/2023   TOA (tubo-ovarian abscess) 04/16/2023   Allergic rhinitis 03/06/2023   Vitamin D  deficiency 01/16/2023   Sore throat (viral) 01/16/2023   Pap smear for cervical cancer screening 03/21/2022   Lumbar pain with radiation down left leg 02/14/2022   S/P cesarean section 05/19/2012    Allergies:  Allergies  Allergen Reactions   Bee Venom Hives, Shortness Of Breath, Swelling and Rash   Peanut (Diagnostic) Hives, Itching and Rash   Mangifera Indica Fruit Ext (Mango) [Mangifera Indica] Hives, Itching and Rash   Penicillins Rash and Hives  Medications:  Current Outpatient Medications:    benzonatate  (TESSALON  PERLES) 100 MG capsule, Take 1 capsule (100 mg total) by mouth 3 (three) times daily as needed., Disp: 20 capsule, Rfl: 0   baclofen  (LIORESAL ) 10 MG tablet, TAKE 1 TABLET(10 MG) BY MOUTH EVERY 8 HOURS AS NEEDED FOR MUSCLE SPASMS, Disp: 30 tablet, Rfl: 0   desogestrel -ethinyl estradiol  (APRI ) 0.15-30 MG-MCG  tablet, Take 1 tablet by mouth daily., Disp: 28 tablet, Rfl: 11   fluticasone  (FLONASE ) 50 MCG/ACT nasal spray, SHAKE LIQUID AND USE 2 SPRAYS IN EACH NOSTRIL DAILY, Disp: 16 g, Rfl: 0   ketorolac  (TORADOL ) 10 MG tablet, Take 1 tablet (10 mg total) by mouth every 8 (eight) hours as needed., Disp: 15 tablet, Rfl: 3   levocetirizine (XYZAL ) 5 MG tablet, TAKE 1 TABLET(5 MG) BY MOUTH EVERY EVENING, Disp: 30 tablet, Rfl: 2   montelukast  (SINGULAIR ) 10 MG tablet, TAKE 1 TABLET(10 MG) BY MOUTH AT BEDTIME, Disp: 30 tablet, Rfl: 3   naproxen  (NAPROSYN ) 500 MG tablet, TAKE 1 TABLET(500 MG) BY MOUTH TWICE DAILY WITH A MEAL, Disp: 60 tablet, Rfl: 1   ondansetron  (ZOFRAN -ODT) 4 MG disintegrating tablet, Take 1 tablet (4 mg total) by mouth every 8 (eight) hours as needed. (Patient not taking: Reported on 01/10/2024), Disp: 20 tablet, Rfl: 0   pantoprazole  (PROTONIX ) 20 MG tablet, Take 1 tablet (20 mg total) by mouth daily., Disp: 30 tablet, Rfl: 2   Vitamin D , Ergocalciferol , (DRISDOL ) 1.25 MG (50000 UNIT) CAPS capsule, Take 1 capsule (50,000 Units total) by mouth every 7 (seven) days., Disp: 20 capsule, Rfl: 2  Observations/Objective: Patient is well-developed, well-nourished in no acute distress.  Resting comfortably  at home.  Head is normocephalic, atraumatic.  No labored breathing.  Speech is clear and coherent with logical content.  Patient is alert and oriented at baseline.  Nasal congest Dry cough  Assessment and Plan: 1. Viral URI (Primary) - benzonatate  (TESSALON  PERLES) 100 MG capsule; Take 1 capsule (100 mg total) by mouth 3 (three) times daily as needed.  Dispense: 20 capsule; Refill: 0  - Take meds as prescribed - Use a cool mist humidifier  -Use saline nose sprays frequently -Force fluids -For any cough or congestion  Use plain Mucinex- regular strength or max strength is fine -For fever or aces or pains- take tylenol  or ibuprofen . -Throat lozenges if help -Follow up if symptoms worsen  or do not improve   Follow Up Instructions: I discussed the assessment and treatment plan with the patient. The patient was provided an opportunity to ask questions and all were answered. The patient agreed with the plan and demonstrated an understanding of the instructions.  A copy of instructions were sent to the patient via MyChart unless otherwise noted below.     The patient was advised to call back or seek an in-person evaluation if the symptoms worsen or if the condition fails to improve as anticipated.    Bari Learn, FNP

## 2024-01-12 NOTE — Patient Instructions (Signed)

## 2024-01-13 ENCOUNTER — Other Ambulatory Visit: Payer: Self-pay | Admitting: Obstetrics & Gynecology

## 2024-02-15 ENCOUNTER — Telehealth: Payer: Self-pay | Admitting: Family Medicine

## 2024-02-15 DIAGNOSIS — M545 Low back pain, unspecified: Secondary | ICD-10-CM

## 2024-02-15 DIAGNOSIS — G8929 Other chronic pain: Secondary | ICD-10-CM

## 2024-02-15 NOTE — Progress Notes (Signed)
 Virtual Visit Consent   [name], you are scheduled for a virtual visit with a [facility] provider today. Just as with appointments in the office, your consent must be obtained to participate. Your consent will be active for this visit and any virtual visit you may have with one of our providers in the next 365 days. If you have a MyChart account, a copy of this consent can be sent to you electronically.  As this is a virtual visit, video technology does not allow for your provider to perform a traditional examination. This may limit your provider's ability to fully assess your condition. If your provider identifies any concerns that need to be evaluated in person or the need to arrange testing (such as labs, EKG, etc.), we will make arrangements to do so. Although advances in technology are sophisticated, we cannot ensure that it will always work on either your end or our end. If the connection with a video visit is poor, the visit may have to be switched to a telephone visit. With either a video or telephone visit, we are not always able to ensure that we have a secure connection.  By engaging in this virtual visit, you consent to the provision of healthcare and authorize for your insurance to be billed (if applicable) for the services provided during this visit. Depending on your insurance coverage, you may receive a charge related to this service.  I need to obtain your verbal consent now. Are you willing to proceed with your visit today? [name] has provided verbal consent on 02/15/2024 for a virtual visit (video or telephone). Roosvelt Mater, [place]  Date: 02/15/2024 7:02 PM   Virtual Visit via Video Note   I, Roosvelt Mater, connected with  [name]  ([id number], Feb 08, 1986) on 02/15/24 at  7:00 PM EST by a video-enabled telemedicine application and verified that I am speaking with the correct person using two identifiers.  Location: Patient: Virtual Visit Location Patient:  Home Provider: Virtual Visit Location Provider: Home Office   I discussed the limitations of evaluation and management by telemedicine and the availability of in person appointments. The patient expressed understanding and agreed to proceed.    History of Present Illness: [name] is a 38 y.o. who identifies as a female who was assigned female at birth, and is being seen today for c/o being at work with worsening back pain. Pt states she felt like her knee kept buckling.  Pt states she was told if she was going to call out of work that she will need a doctors. Pt states taking Naproxen  and a muscle relaxer for pain. Pt does not report numbness or tingling or bowel or bladder loss.   HPI: HPI  Problems:  Patient Active Problem List   Diagnosis Date Noted   Colon cancer screening 01/10/2024   Obesity (BMI 30.0-34.9) 07/08/2023   TOA (tubo-ovarian abscess) 04/16/2023   Allergic rhinitis 03/06/2023   Vitamin D  deficiency 01/16/2023   Sore throat (viral) 01/16/2023   Pap smear for cervical cancer screening 03/21/2022   Lumbar pain with radiation down left leg 02/14/2022   S/P cesarean section 05/19/2012    Allergies:  Allergies  Allergen Reactions   Bee Venom Hives, Shortness Of Breath, Swelling and Rash   Peanut (Diagnostic) Hives, Itching and Rash   Mangifera Indica Fruit Ext (Mango) [Mangifera Indica] Hives, Itching and Rash   Penicillins Rash and Hives   Medications:  Current Outpatient Medications:  baclofen  (LIORESAL ) 10 MG tablet, TAKE 1 TABLET(10 MG) BY MOUTH EVERY 8 HOURS AS NEEDED FOR MUSCLE SPASMS, Disp: 30 tablet, Rfl: 0   benzonatate  (TESSALON  PERLES) 100 MG capsule, Take 1 capsule (100 mg total) by mouth 3 (three) times daily as needed., Disp: 20 capsule, Rfl: 0   desogestrel -ethinyl estradiol  (APRI ) 0.15-30 MG-MCG tablet, Take 1 tablet by mouth daily., Disp: 28 tablet, Rfl: 11   fluticasone  (FLONASE ) 50 MCG/ACT nasal spray, SHAKE LIQUID AND USE 2 SPRAYS IN EACH  NOSTRIL DAILY, Disp: 16 g, Rfl: 0   ketorolac  (TORADOL ) 10 MG tablet, Take 1 tablet (10 mg total) by mouth every 8 (eight) hours as needed., Disp: 15 tablet, Rfl: 3   levocetirizine (XYZAL ) 5 MG tablet, TAKE 1 TABLET(5 MG) BY MOUTH EVERY EVENING, Disp: 30 tablet, Rfl: 2   montelukast  (SINGULAIR ) 10 MG tablet, TAKE 1 TABLET(10 MG) BY MOUTH AT BEDTIME, Disp: 30 tablet, Rfl: 3   naproxen  (NAPROSYN ) 500 MG tablet, TAKE 1 TABLET(500 MG) BY MOUTH TWICE DAILY WITH A MEAL, Disp: 60 tablet, Rfl: 1   ondansetron  (ZOFRAN -ODT) 4 MG disintegrating tablet, Take 1 tablet (4 mg total) by mouth every 8 (eight) hours as needed. (Patient not taking: Reported on 01/10/2024), Disp: 20 tablet, Rfl: 0   pantoprazole  (PROTONIX ) 20 MG tablet, TAKE 1 TABLET(20 MG) BY MOUTH DAILY, Disp: 30 tablet, Rfl: 2   Vitamin D , Ergocalciferol , (DRISDOL ) 1.25 MG (50000 UNIT) CAPS capsule, Take 1 capsule (50,000 Units total) by mouth every 7 (seven) days., Disp: 20 capsule, Rfl: 2  Observations/Objective: Patient is well-developed, well-nourished in no acute distress.  Resting comfortably at home.  Head is normocephalic, atraumatic.  No labored breathing.  Speech is clear and coherent with logical content.  Patient is alert and oriented at baseline.    Assessment and Plan: 1. Chronic bilateral low back pain without sciatica (Primary)  -Pt to continue with medications as prescribed -Pt advised to follow up in person urgent care or PCP with worsening or alarming symptoms -Work note provided  Follow Up Instructions: I discussed the assessment and treatment plan with the patient. The patient was provided an opportunity to ask questions and all were answered. The patient agreed with the plan and demonstrated an understanding of the instructions.  A copy of instructions were sent to the patient via MyChart unless otherwise noted below.    The patient was advised to call back or seek an in-person evaluation if the symptoms worsen or  if the condition fails to improve as anticipated.    Roosvelt Mater, PA-C

## 2024-02-15 NOTE — Patient Instructions (Signed)
 [name], thank you for joining Roosvelt Mater, PA-C for today's virtual visit.  While this provider is not your primary care provider (PCP), if your PCP is located in our provider database this encounter information will be shared with them immediately following your visit.   A [facility] MyChart account gives you access to today's visit and all your visits, tests, and labs performed at [facility]  click here if you don't have a [facility] MyChart account or go to [email or web address]  Consent: (Patient) [name] provided verbal consent for this virtual visit at the beginning of the encounter.  Current Medications:  Current Outpatient Medications:    baclofen  (LIORESAL ) 10 MG tablet, TAKE 1 TABLET(10 MG) BY MOUTH EVERY 8 HOURS AS NEEDED FOR MUSCLE SPASMS, Disp: 30 tablet, Rfl: 0   benzonatate  (TESSALON  PERLES) 100 MG capsule, Take 1 capsule (100 mg total) by mouth 3 (three) times daily as needed., Disp: 20 capsule, Rfl: 0   desogestrel -ethinyl estradiol  (APRI ) 0.15-30 MG-MCG tablet, Take 1 tablet by mouth daily., Disp: 28 tablet, Rfl: 11   fluticasone  (FLONASE ) 50 MCG/ACT nasal spray, SHAKE LIQUID AND USE 2 SPRAYS IN EACH NOSTRIL DAILY, Disp: 16 g, Rfl: 0   ketorolac  (TORADOL ) 10 MG tablet, Take 1 tablet (10 mg total) by mouth every 8 (eight) hours as needed., Disp: 15 tablet, Rfl: 3   levocetirizine (XYZAL ) 5 MG tablet, TAKE 1 TABLET(5 MG) BY MOUTH EVERY EVENING, Disp: 30 tablet, Rfl: 2   montelukast  (SINGULAIR ) 10 MG tablet, TAKE 1 TABLET(10 MG) BY MOUTH AT BEDTIME, Disp: 30 tablet, Rfl: 3   naproxen  (NAPROSYN ) 500 MG tablet, TAKE 1 TABLET(500 MG) BY MOUTH TWICE DAILY WITH A MEAL, Disp: 60 tablet, Rfl: 1   ondansetron  (ZOFRAN -ODT) 4 MG disintegrating tablet, Take 1 tablet (4 mg total) by mouth every 8 (eight) hours as needed. (Patient not taking: Reported on 01/10/2024), Disp: 20 tablet, Rfl: 0   pantoprazole  (PROTONIX ) 20 MG tablet, TAKE 1 TABLET(20 MG) BY  MOUTH DAILY, Disp: 30 tablet, Rfl: 2   Vitamin D , Ergocalciferol , (DRISDOL ) 1.25 MG (50000 UNIT) CAPS capsule, Take 1 capsule (50,000 Units total) by mouth every 7 (seven) days., Disp: 20 capsule, Rfl: 2   Medications ordered in this encounter:  No orders of the defined types were placed in this encounter.    *If you need refills on other medications prior to your next appointment, please contact your pharmacy*  Follow-Up: Call back or seek an in-person evaluation if the symptoms worsen or if the condition fails to improve as anticipated.  [facility] [phone]  Other Instructions Acute Back Pain, Adult Acute back pain is sudden and usually short-lived. It is often caused by an injury to the muscles and tissues in the back. The injury may result from: A muscle, tendon, or ligament getting overstretched or torn. Ligaments are tissues that connect bones to each other. Lifting something improperly can cause a back strain. Wear and tear (degeneration) of the spinal disks. Spinal disks are circular tissue that provide cushioning between the bones of the spine (vertebrae). Twisting motions, such as while playing sports or doing yard work. A hit to the back. Arthritis. You may have a physical exam, lab tests, and imaging tests to find the cause of your pain. Acute back pain usually goes away with rest and home care. Follow these instructions at home: Managing pain, stiffness, and swelling Take over-the-counter and prescription medicines only as told by your health care provider. Treatment  may include medicines for pain and inflammation that are taken by mouth or applied to the skin, or muscle relaxants. Your health care provider may recommend applying ice during the first 24-48 hours after your pain starts. To do this: Put ice in a plastic bag. Place a towel between your skin and the bag. Leave the ice on for 20 minutes, 2-3 times a day. Remove the ice if your skin turns  bright red. This is very important. If you cannot feel pain, heat, or cold, you have a greater risk of damage to the area. If directed, apply heat to the affected area as often as told by your health care provider. Use the heat source that your health care provider recommends, such as a moist heat pack or a heating pad. Place a towel between your skin and the heat source. Leave the heat on for 20-30 minutes. Remove the heat if your skin turns bright red. This is especially important if you are unable to feel pain, heat, or cold. You have a greater risk of getting burned. Activity  Do not stay in bed. Staying in bed for more than 1-2 days can delay your recovery. Sit up and stand up straight. Avoid leaning forward when you sit or hunching over when you stand. If you work at a desk, sit close to it so you do not need to lean over. Keep your chin tucked in. Keep your neck drawn back, and keep your elbows bent at a 90-degree angle (right angle). Sit high and close to the steering wheel when you drive. Add lower back (lumbar) support to your car seat, if needed. Take short walks on even surfaces as soon as you are able. Try to increase the length of time you walk each day. Do not sit, drive, or stand in one place for more than 30 minutes at a time. Sitting or standing for long periods of time can put stress on your back. Do not drive or use heavy machinery while taking prescription pain medicine. Use proper lifting techniques. When you bend and lift, use positions that put less stress on your back: [place] your knees. Keep the load close to your body. Avoid twisting. Exercise regularly as told by your health care provider. Exercising helps your back heal faster and helps prevent back injuries by keeping muscles strong and flexible. Work with a physical therapist to make a safe exercise program, as recommended by your health care provider. Do any exercises as told by your physical  therapist. Lifestyle Maintain a healthy weight. Extra weight puts stress on your back and makes it difficult to have good posture. Avoid activities or situations that make you feel anxious or stressed. Stress and anxiety increase muscle tension and can make back pain worse. Learn ways to manage anxiety and stress, such as through exercise. General instructions Sleep on a firm mattress in a comfortable position. Try lying on your side with your knees slightly bent. If you lie on your back, put a pillow under your knees. Keep your head and neck in a straight line with your spine (neutral position) when using electronic equipment like smartphones or pads. To do this: Raise your smartphone or pad to look at it instead of bending your head or neck to look down. Put the smartphone or pad at the level of your face while looking at the screen. Follow your treatment plan as told by your health care provider. This may include: Cognitive or behavioral therapy. Acupuncture or massage therapy.  Meditation or yoga. Contact a health care provider if: You have pain that is not relieved with rest or medicine. You have increasing pain going down into your legs or buttocks. Your pain does not improve after 2 weeks. You have pain at night. You lose weight without trying. You have a fever or chills. You develop nausea or vomiting. You develop abdominal pain. Get help right away if: You develop new bowel or bladder control problems. You have unusual weakness or numbness in your arms or legs. You feel faint. These symptoms may represent a serious problem that is an emergency. Do not wait to see if the symptoms will go away. Get medical help right away. Call your local emergency services (911 in the [place] Do not drive yourself to the hospital. Summary Acute back pain is sudden and usually short-lived. Use proper lifting techniques. When you bend and lift, use positions that put less stress on your back. Take  over-the-counter and prescription medicines only as told by your health care provider, and apply heat or ice as told. This information is not intended to replace advice given to you by your health care provider. Make sure you discuss any questions you have with your health care provider. Document Revised: 06/17/2020 Document Reviewed: 06/17/2020 Elsevier Patient Education  2024 [employer]   If you have been instructed to have an in-person evaluation today at a local Urgent Care facility, please use the link below. It will take you to a list of all of our available [facility], including address, phone number and hours of operation. Please do not delay care.  [facility]  If you or a family member do not have a primary care provider, use the link below to schedule a visit and establish care. When you choose a [facility] primary care physician or advanced practice provider, you gain a long-term partner in health. Find a Primary Care Provider  Learn more about [facility]'s in-office and virtual care options: [facility] - Get Care Now

## 2024-03-31 ENCOUNTER — Telehealth: Payer: Self-pay

## 2024-06-10 ENCOUNTER — Ambulatory Visit: Admitting: Nurse Practitioner
